# Patient Record
Sex: Male | Born: 1950
Health system: Southern US, Community
[De-identification: ages and names within clinical notes are randomized; demographics above are authoritative.]

## PROBLEM LIST (undated history)

## (undated) DIAGNOSIS — T7840XA Allergy, unspecified, initial encounter: Secondary | ICD-10-CM

## (undated) DIAGNOSIS — Z973 Presence of spectacles and contact lenses: Secondary | ICD-10-CM

## (undated) DIAGNOSIS — Z87442 Personal history of urinary calculi: Secondary | ICD-10-CM

## (undated) DIAGNOSIS — L989 Disorder of the skin and subcutaneous tissue, unspecified: Secondary | ICD-10-CM

## (undated) DIAGNOSIS — C801 Malignant (primary) neoplasm, unspecified: Secondary | ICD-10-CM

## (undated) DIAGNOSIS — I10 Essential (primary) hypertension: Secondary | ICD-10-CM

## (undated) DIAGNOSIS — T8859XA Other complications of anesthesia, initial encounter: Secondary | ICD-10-CM

## (undated) DIAGNOSIS — E785 Hyperlipidemia, unspecified: Secondary | ICD-10-CM

## (undated) DIAGNOSIS — R42 Dizziness and giddiness: Secondary | ICD-10-CM

## (undated) DIAGNOSIS — H269 Unspecified cataract: Secondary | ICD-10-CM

## (undated) DIAGNOSIS — E538 Deficiency of other specified B group vitamins: Secondary | ICD-10-CM

## (undated) HISTORY — PX: COLONOSCOPY: SHX174

## (undated) HISTORY — DX: Personal history of urinary calculi: Z87.442

## (undated) HISTORY — DX: Deficiency of other specified B group vitamins: E53.8

## (undated) HISTORY — DX: Allergy, unspecified, initial encounter: T78.40XA

## (undated) HISTORY — DX: Dizziness and giddiness: R42

## (undated) HISTORY — DX: Unspecified cataract: H26.9

## (undated) HISTORY — PX: OTHER SURGICAL HISTORY: SHX169

## (undated) HISTORY — DX: Essential (primary) hypertension: I10

## (undated) HISTORY — PX: POLYPECTOMY: SHX149

## (undated) HISTORY — DX: Hyperlipidemia, unspecified: E78.5

---

## 1972-02-08 HISTORY — PX: OTHER SURGICAL HISTORY: SHX169

## 1998-05-22 ENCOUNTER — Emergency Department (HOSPITAL_COMMUNITY): Admission: EM | Admit: 1998-05-22 | Discharge: 1998-05-22 | Payer: Self-pay | Admitting: Emergency Medicine

## 1999-09-07 ENCOUNTER — Ambulatory Visit (HOSPITAL_COMMUNITY): Admission: RE | Admit: 1999-09-07 | Discharge: 1999-09-07 | Payer: Self-pay | Admitting: Family Medicine

## 1999-09-07 ENCOUNTER — Encounter: Payer: Self-pay | Admitting: Family Medicine

## 1999-10-04 ENCOUNTER — Ambulatory Visit (HOSPITAL_COMMUNITY): Admission: RE | Admit: 1999-10-04 | Discharge: 1999-10-04 | Payer: Self-pay | Admitting: Internal Medicine

## 1999-10-04 ENCOUNTER — Encounter: Payer: Self-pay | Admitting: Internal Medicine

## 2003-11-21 ENCOUNTER — Ambulatory Visit (HOSPITAL_COMMUNITY): Admission: RE | Admit: 2003-11-21 | Discharge: 2003-11-21 | Payer: Self-pay | Admitting: Internal Medicine

## 2003-12-30 ENCOUNTER — Ambulatory Visit: Payer: Self-pay | Admitting: Internal Medicine

## 2004-04-12 ENCOUNTER — Ambulatory Visit: Payer: Self-pay | Admitting: Internal Medicine

## 2004-05-10 ENCOUNTER — Ambulatory Visit: Payer: Self-pay | Admitting: Internal Medicine

## 2004-12-13 ENCOUNTER — Ambulatory Visit: Payer: Self-pay | Admitting: Internal Medicine

## 2004-12-15 ENCOUNTER — Ambulatory Visit: Payer: Self-pay | Admitting: Internal Medicine

## 2005-01-04 ENCOUNTER — Ambulatory Visit: Payer: Self-pay | Admitting: Gastroenterology

## 2005-03-10 ENCOUNTER — Ambulatory Visit: Payer: Self-pay | Admitting: Gastroenterology

## 2005-03-10 ENCOUNTER — Ambulatory Visit: Payer: Self-pay | Admitting: Internal Medicine

## 2005-04-04 ENCOUNTER — Ambulatory Visit: Payer: Self-pay | Admitting: Internal Medicine

## 2005-04-04 ENCOUNTER — Encounter (INDEPENDENT_AMBULATORY_CARE_PROVIDER_SITE_OTHER): Payer: Self-pay | Admitting: Specialist

## 2005-08-17 ENCOUNTER — Ambulatory Visit: Payer: Self-pay | Admitting: Internal Medicine

## 2005-12-01 ENCOUNTER — Ambulatory Visit: Payer: Self-pay | Admitting: Internal Medicine

## 2005-12-07 ENCOUNTER — Ambulatory Visit (HOSPITAL_COMMUNITY): Admission: RE | Admit: 2005-12-07 | Discharge: 2005-12-07 | Payer: Self-pay | Admitting: Internal Medicine

## 2005-12-19 ENCOUNTER — Ambulatory Visit: Payer: Self-pay | Admitting: Internal Medicine

## 2005-12-21 ENCOUNTER — Ambulatory Visit: Payer: Self-pay

## 2006-07-18 ENCOUNTER — Ambulatory Visit: Payer: Self-pay | Admitting: Internal Medicine

## 2006-07-18 LAB — CONVERTED CEMR LAB: Hgb A1c MFr Bld: 8.7 % — ABNORMAL HIGH (ref 4.6–6.0)

## 2007-01-02 ENCOUNTER — Telehealth: Payer: Self-pay | Admitting: Internal Medicine

## 2007-01-02 ENCOUNTER — Ambulatory Visit: Payer: Self-pay | Admitting: Internal Medicine

## 2007-01-02 LAB — CONVERTED CEMR LAB: Hgb A1c MFr Bld: 6.6 % — ABNORMAL HIGH (ref 4.6–6.0)

## 2007-01-18 ENCOUNTER — Telehealth: Payer: Self-pay | Admitting: Internal Medicine

## 2007-01-22 ENCOUNTER — Encounter: Payer: Self-pay | Admitting: *Deleted

## 2007-01-22 DIAGNOSIS — Z87442 Personal history of urinary calculi: Secondary | ICD-10-CM

## 2007-01-22 DIAGNOSIS — E785 Hyperlipidemia, unspecified: Secondary | ICD-10-CM | POA: Insufficient documentation

## 2007-01-22 DIAGNOSIS — R42 Dizziness and giddiness: Secondary | ICD-10-CM

## 2007-01-22 DIAGNOSIS — E1159 Type 2 diabetes mellitus with other circulatory complications: Secondary | ICD-10-CM | POA: Insufficient documentation

## 2007-02-09 ENCOUNTER — Telehealth: Payer: Self-pay | Admitting: Internal Medicine

## 2007-07-24 ENCOUNTER — Encounter: Payer: Self-pay | Admitting: Internal Medicine

## 2007-08-01 ENCOUNTER — Telehealth: Payer: Self-pay | Admitting: Internal Medicine

## 2007-08-24 ENCOUNTER — Ambulatory Visit: Payer: Self-pay | Admitting: Internal Medicine

## 2007-08-24 DIAGNOSIS — R05 Cough: Secondary | ICD-10-CM

## 2007-08-24 DIAGNOSIS — R051 Acute cough: Secondary | ICD-10-CM | POA: Insufficient documentation

## 2007-08-24 DIAGNOSIS — R6882 Decreased libido: Secondary | ICD-10-CM | POA: Insufficient documentation

## 2007-08-24 LAB — CONVERTED CEMR LAB
BUN: 15 mg/dL (ref 6–23)
CO2: 29 meq/L (ref 19–32)
Chloride: 104 meq/L (ref 96–112)
Cholesterol: 153 mg/dL (ref 0–200)
Glucose, Bld: 86 mg/dL (ref 70–99)
Potassium: 4.3 meq/L (ref 3.5–5.1)
Sodium: 139 meq/L (ref 135–145)
Testosterone: 502.88 ng/dL (ref 350.00–890)
VLDL: 17 mg/dL (ref 0–40)

## 2007-08-26 ENCOUNTER — Encounter: Payer: Self-pay | Admitting: Internal Medicine

## 2007-08-30 ENCOUNTER — Telehealth: Payer: Self-pay | Admitting: Internal Medicine

## 2007-11-05 ENCOUNTER — Telehealth: Payer: Self-pay | Admitting: Internal Medicine

## 2007-11-26 ENCOUNTER — Ambulatory Visit: Payer: Self-pay | Admitting: Internal Medicine

## 2007-11-26 DIAGNOSIS — J1189 Influenza due to unidentified influenza virus with other manifestations: Secondary | ICD-10-CM | POA: Insufficient documentation

## 2007-11-26 LAB — CONVERTED CEMR LAB
Inflenza A Ag: NEGATIVE
Influenza B Ag: NEGATIVE

## 2007-11-28 ENCOUNTER — Telehealth: Payer: Self-pay | Admitting: Internal Medicine

## 2007-11-30 ENCOUNTER — Telehealth: Payer: Self-pay | Admitting: Internal Medicine

## 2007-12-02 ENCOUNTER — Encounter: Payer: Self-pay | Admitting: Internal Medicine

## 2008-01-30 ENCOUNTER — Telehealth: Payer: Self-pay | Admitting: Internal Medicine

## 2008-03-17 ENCOUNTER — Ambulatory Visit: Payer: Self-pay | Admitting: Internal Medicine

## 2008-03-20 ENCOUNTER — Encounter (INDEPENDENT_AMBULATORY_CARE_PROVIDER_SITE_OTHER): Payer: Self-pay | Admitting: *Deleted

## 2008-03-20 ENCOUNTER — Telehealth (INDEPENDENT_AMBULATORY_CARE_PROVIDER_SITE_OTHER): Payer: Self-pay | Admitting: *Deleted

## 2008-05-16 ENCOUNTER — Ambulatory Visit: Payer: Self-pay | Admitting: Internal Medicine

## 2008-05-30 ENCOUNTER — Encounter: Payer: Self-pay | Admitting: Internal Medicine

## 2008-05-30 ENCOUNTER — Ambulatory Visit: Payer: Self-pay | Admitting: Internal Medicine

## 2008-05-30 LAB — HM COLONOSCOPY

## 2008-06-02 ENCOUNTER — Encounter: Payer: Self-pay | Admitting: Internal Medicine

## 2008-06-17 ENCOUNTER — Telehealth: Payer: Self-pay | Admitting: Internal Medicine

## 2008-11-13 ENCOUNTER — Telehealth (INDEPENDENT_AMBULATORY_CARE_PROVIDER_SITE_OTHER): Payer: Self-pay | Admitting: *Deleted

## 2008-11-27 ENCOUNTER — Telehealth: Payer: Self-pay | Admitting: Internal Medicine

## 2008-12-31 ENCOUNTER — Ambulatory Visit: Payer: Self-pay | Admitting: Internal Medicine

## 2009-01-05 LAB — CONVERTED CEMR LAB: Hgb A1c MFr Bld: 8.2 % — ABNORMAL HIGH (ref 4.6–6.5)

## 2009-01-06 ENCOUNTER — Telehealth (INDEPENDENT_AMBULATORY_CARE_PROVIDER_SITE_OTHER): Payer: Self-pay | Admitting: *Deleted

## 2009-01-07 ENCOUNTER — Ambulatory Visit: Payer: Self-pay | Admitting: Internal Medicine

## 2009-01-09 ENCOUNTER — Telehealth: Payer: Self-pay | Admitting: Internal Medicine

## 2009-01-11 ENCOUNTER — Telehealth: Payer: Self-pay | Admitting: Family Medicine

## 2009-01-12 ENCOUNTER — Ambulatory Visit: Payer: Self-pay | Admitting: Internal Medicine

## 2009-01-27 ENCOUNTER — Ambulatory Visit: Payer: Self-pay | Admitting: Internal Medicine

## 2009-01-27 DIAGNOSIS — J069 Acute upper respiratory infection, unspecified: Secondary | ICD-10-CM | POA: Insufficient documentation

## 2009-02-16 ENCOUNTER — Telehealth: Payer: Self-pay | Admitting: Internal Medicine

## 2009-03-02 ENCOUNTER — Ambulatory Visit: Payer: Self-pay | Admitting: Internal Medicine

## 2009-03-02 DIAGNOSIS — B351 Tinea unguium: Secondary | ICD-10-CM

## 2009-03-03 ENCOUNTER — Telehealth: Payer: Self-pay | Admitting: Internal Medicine

## 2009-03-03 ENCOUNTER — Encounter: Payer: Self-pay | Admitting: Internal Medicine

## 2009-03-05 ENCOUNTER — Encounter: Payer: Self-pay | Admitting: Internal Medicine

## 2009-03-13 ENCOUNTER — Telehealth: Payer: Self-pay | Admitting: Internal Medicine

## 2009-03-16 ENCOUNTER — Ambulatory Visit: Payer: Self-pay | Admitting: Internal Medicine

## 2009-04-27 ENCOUNTER — Telehealth: Payer: Self-pay | Admitting: Internal Medicine

## 2009-04-28 ENCOUNTER — Ambulatory Visit: Payer: Self-pay | Admitting: Internal Medicine

## 2009-04-28 DIAGNOSIS — J309 Allergic rhinitis, unspecified: Secondary | ICD-10-CM | POA: Insufficient documentation

## 2009-06-19 ENCOUNTER — Ambulatory Visit: Payer: Self-pay | Admitting: Internal Medicine

## 2009-06-19 LAB — CONVERTED CEMR LAB: Hgb A1c MFr Bld: 7.5 % — ABNORMAL HIGH (ref 4.6–6.5)

## 2009-06-22 ENCOUNTER — Encounter: Payer: Self-pay | Admitting: Internal Medicine

## 2009-08-15 ENCOUNTER — Ambulatory Visit: Payer: Self-pay | Admitting: Family Medicine

## 2009-08-15 DIAGNOSIS — L255 Unspecified contact dermatitis due to plants, except food: Secondary | ICD-10-CM

## 2009-11-27 ENCOUNTER — Ambulatory Visit: Payer: Self-pay | Admitting: Internal Medicine

## 2009-11-27 LAB — CONVERTED CEMR LAB: Hgb A1c MFr Bld: 8.9 % — ABNORMAL HIGH (ref 4.6–6.5)

## 2009-12-04 ENCOUNTER — Telehealth: Payer: Self-pay | Admitting: Internal Medicine

## 2009-12-07 ENCOUNTER — Ambulatory Visit: Payer: Self-pay | Admitting: Internal Medicine

## 2010-01-06 ENCOUNTER — Ambulatory Visit: Payer: Self-pay | Admitting: Internal Medicine

## 2010-02-07 DIAGNOSIS — Z87442 Personal history of urinary calculi: Secondary | ICD-10-CM

## 2010-02-07 HISTORY — DX: Personal history of urinary calculi: Z87.442

## 2010-02-19 ENCOUNTER — Other Ambulatory Visit: Payer: Self-pay | Admitting: Internal Medicine

## 2010-02-19 ENCOUNTER — Ambulatory Visit
Admission: RE | Admit: 2010-02-19 | Discharge: 2010-02-19 | Payer: Self-pay | Source: Home / Self Care | Attending: Internal Medicine | Admitting: Internal Medicine

## 2010-02-19 DIAGNOSIS — L299 Pruritus, unspecified: Secondary | ICD-10-CM | POA: Insufficient documentation

## 2010-02-19 LAB — BASIC METABOLIC PANEL
BUN: 15 mg/dL (ref 6–23)
CO2: 30 mEq/L (ref 19–32)
Calcium: 9.4 mg/dL (ref 8.4–10.5)
Chloride: 104 mEq/L (ref 96–112)
Creatinine, Ser: 1 mg/dL (ref 0.4–1.5)
GFR: 82.73 mL/min (ref >60.00)
Glucose, Bld: 124 mg/dL — ABNORMAL HIGH (ref 70–99)
Potassium: 4.8 mEq/L (ref 3.5–5.1)
Sodium: 141 mEq/L (ref 135–145)

## 2010-02-19 LAB — HEPATIC FUNCTION PANEL
ALT: 20 U/L (ref 0–53)
AST: 19 U/L (ref 0–37)
Albumin: 4.4 g/dL (ref 3.5–5.2)
Alkaline Phosphatase: 47 U/L (ref 39–117)
Bilirubin, Direct: 0.2 mg/dL (ref 0.0–0.3)
Total Bilirubin: 0.9 mg/dL (ref 0.3–1.2)
Total Protein: 7.2 g/dL (ref 6.0–8.3)

## 2010-02-19 LAB — HIGH SENSITIVITY CRP: CRP, High Sensitivity: 2.43 mg/L (ref 0.00–5.00)

## 2010-02-19 LAB — HEMOGLOBIN A1C: Hgb A1c MFr Bld: 7.8 % — ABNORMAL HIGH (ref 4.6–6.5)

## 2010-02-22 LAB — TSH: TSH: 0.94 u[IU]/mL (ref 0.35–5.50)

## 2010-02-22 LAB — B12 AND FOLATE PANEL
Folate: 17.6 ng/mL (ref >5.9)
Vitamin B-12: 165 pg/mL — ABNORMAL LOW (ref 211–911)

## 2010-02-23 ENCOUNTER — Telehealth: Payer: Self-pay | Admitting: Internal Medicine

## 2010-02-27 ENCOUNTER — Encounter: Payer: Self-pay | Admitting: Internal Medicine

## 2010-03-01 ENCOUNTER — Ambulatory Visit
Admission: RE | Admit: 2010-03-01 | Discharge: 2010-03-01 | Payer: Self-pay | Source: Home / Self Care | Attending: Internal Medicine | Admitting: Internal Medicine

## 2010-03-01 DIAGNOSIS — E538 Deficiency of other specified B group vitamins: Secondary | ICD-10-CM | POA: Insufficient documentation

## 2010-03-02 ENCOUNTER — Telehealth: Payer: Self-pay | Admitting: Internal Medicine

## 2010-03-09 NOTE — Progress Notes (Signed)
  Phone Note Refill Request Message from:  Fax from Pharmacy on February 16, 2009 1:33 PM  Refills Requested: Medication #1:  METFORMIN HCL 1000 MG  TABS two times a day Initial call taken by: Ami Bullins CMA,  February 16, 2009 1:33 PM    Prescriptions: METFORMIN HCL 1000 MG  TABS (METFORMIN HCL) two times a day  #60 x 6   Entered by:   Ami Bullins CMA   Authorized by:   Jacques Navy MD   Signed by:   Bill Salinas CMA on 02/16/2009   Method used:   Electronically to        [facility]* (retail)       [address]       [place], [place]  [address]       Ph: [phone]       Fax: [phone]   RxID:   [id number]

## 2010-03-09 NOTE — Assessment & Plan Note (Signed)
Summary: TOE FUNGUS--D/T---STC   Vital Signs:  Patient profile:   60 year old male Height:      68 inches Weight:      232 pounds BMI:     35.40 O2 Sat:      97 % on Room air Temp:     97.4 degrees F oral Pulse rate:   66 / minute BP sitting:   122 / 80  (left arm) Cuff size:   large  Vitals Entered By: Bill Salinas CMA (March 02, 2009 11:31 AM)  O2 Flow:  Room air CC: pt c/o toe fungus on his left foot, under his toenail on his great toe/ ab   Primary Care Provider:  Kimorah Ridolfi  CC:  pt c/o toe fungus on his left foot and under his toenail on his great toe/ ab.  History of Present Illness: toe nail fungus left great nail. It is causing discomfort when he wears his work  boots.   Diuabetes - reports that blood sugars have been running high-greater than 200 in the AM. He is taking [place] and metformin. He is requesting a referral to a nutritionist to help better manage his diabetes   Current Medications (verified): 1)  Zocor 20 Mg  Tabs (Simvastatin) .... Once Daily 2)  Aspirin 325 Mg  Tabs (Aspirin) .... Once Daily 3)  Metformin Hcl 1000 Mg  Tabs (Metformin Hcl) .... Two Times A Day 4)  Lisinopril 10 Mg  Tabs (Lisinopril) .... Once Daily 5)  Tylenol Extra Strength 500 Mg Tabs (Acetaminophen) .... As Directed 6)  Coricidin Hbp Cough/cold 4-30 Mg Tabs (Chlorpheniramine-Dm) .... As Directed 7)  Promethazine-Codeine 6.25-10 Mg/86ml Syrp (Promethazine-Codeine) .... As Needed 8)  Onetouch Ultra Test  Strp (Glucose Blood) .... Use 1 Strip Three Times A Day 9)  Januvia 100 Mg Tabs (Sitagliptin Phosphate) .Marland Kitchen.. 1 Daily  Allergies (verified): 1)  ! * Actos PMH-FH-SH reviewed-no changes except otherwise noted  Review of Systems  The patient denies anorexia, fever, weight loss, weight gain, chest pain, dyspnea on exertion, prolonged cough, abdominal pain, muscle weakness, difficulty walking, and enlarged lymph nodes.    Physical Exam  Skin:  left great nail with a white fungus  appearance and thickened nail.    Impression & Recommendations:  Problem # 1:  ONYCHOMYCOSIS, TOENAILS (ICD-110.1)  Nail fungus that is causing pain.  Plan terbinafine 250mg  once daily x 90 days.  His updated medication list for this problem includes:    Terbinafine Hcl 250 Mg Tabs (Terbinafine hcl) .Marland Kitchen... 1 by mouth once daily  Problem # 2:  DIABETES MELLITUS, TYPE II (ICD-250.00)  His updated medication list for this problem includes:    Aspirin 325 Mg Tabs (Aspirin) ..... Once daily    Metformin Hcl 1000 Mg Tabs (Metformin hcl) .Marland Kitchen..Marland Kitchen Two times a day    Lisinopril 10 Mg Tabs (Lisinopril) ..... Once daily    Januvia 100 Mg Tabs (Sitagliptin phosphate) .Marland Kitchen... 1 daily  Labs Reviewed: Creat: 1.0 (08/24/2007)    Reviewed HgBA1c results: 8.2 (12/31/2008)  6.9 (08/24/2007)  Suboptimal control.  Plan  continue present meds - taking Januvia at night          refer to Ms. Spagnola at [facility]  Orders: Nutrition Referral (Nutrition)  Complete Medication List: 1)  Zocor 20 Mg Tabs (Simvastatin) .... Once daily 2)  Aspirin 325 Mg Tabs (Aspirin) .... Once daily 3)  Metformin Hcl 1000 Mg Tabs (Metformin hcl) .... Two times a day 4)  Lisinopril 10  Mg Tabs (Lisinopril) .... Once daily 5)  Tylenol Extra Strength 500 Mg Tabs (Acetaminophen) .... As directed 6)  Coricidin Hbp Cough/cold 4-30 Mg Tabs (Chlorpheniramine-dm) .... As directed 7)  Promethazine-codeine 6.25-10 Mg/57ml Syrp (Promethazine-codeine) .... As needed 8)  Onetouch Ultra Test Strp (Glucose blood) .... Use 1 strip three times a day 9)  Januvia 100 Mg Tabs (Sitagliptin phosphate) .Marland Kitchen.. 1 daily 10)  Terbinafine Hcl 250 Mg Tabs (Terbinafine hcl) .Marland Kitchen.. 1 by mouth once daily Prescriptions: ASPIRIN 325 MG  TABS (ASPIRIN) once daily  #300 x 1   Entered and Authorized by:   Jacques Navy MD   Signed by:   Jacques Navy MD on 03/02/2009   Method used:   Print then Give to Patient   RxID:    [id number] TERBINAFINE HCL 250 MG TABS (TERBINAFINE HCL) 1 by mouth once daily  #30 x 2   Entered and Authorized by:   Jacques Navy MD   Signed by:   Jacques Navy MD on 03/02/2009   Method used:   Electronically to        [facility]* (retail)       [name]       [place], [place]  [address]       Ph: [phone]       Fax: [phone]   RxID:   [id number]

## 2010-03-09 NOTE — Assessment & Plan Note (Signed)
Summary: SICK ALL WEEKEND,COUGH,SORE THROAT/CD   Vital Signs:  Patient profile:   60 year old male Height:      68 inches Weight:      229 pounds BMI:     34.95 O2 Sat:      97 % on Room air Temp:     98.3 degrees F oral Pulse rate:   75 / minute BP sitting:   102 / 60  (left arm) Cuff size:   large  Vitals Entered By: Bill Salinas CMA (March 16, 2009 12:03 PM)  O2 Flow:  Room air CC: pt c/o runny nose, head congestion , cough and fever x 4 days/ pt has not had a flu shot this year/ ab   Primary Care Provider:  Demecia Northway  CC:  pt c/o runny nose, head congestion , and cough and fever x 4 days/ pt has not had a flu shot this year/ ab.  History of Present Illness: Patient presents with a 3 day h/o fever to 102, cough productive copious sputum, head congestion. No vomiting, some nausea. He has been able to keep down fluids. He has had rhinorrhea. He hasn't had significant SOB.   Current Medications (verified): 1)  Zocor 20 Mg  Tabs (Simvastatin) .... Once Daily 2)  Aspirin 325 Mg  Tabs (Aspirin) .... Once Daily 3)  Metformin Hcl 1000 Mg  Tabs (Metformin Hcl) .... Two Times A Day 4)  Lisinopril 10 Mg  Tabs (Lisinopril) .... Once Daily 5)  Tylenol Extra Strength 500 Mg Tabs (Acetaminophen) .... As Directed 6)  Coricidin Hbp Cough/cold 4-30 Mg Tabs (Chlorpheniramine-Dm) .... As Directed 7)  Promethazine-Codeine 6.25-10 Mg/67ml Syrp (Promethazine-Codeine) .... As Needed 8)  Onetouch Ultra Test  Strp (Glucose Blood) .... Use 1 Strip Three Times A Day 9)  Januvia 100 Mg Tabs (Sitagliptin Phosphate) .Marland Kitchen.. 1 Daily 10)  Terbinafine Hcl 250 Mg Tabs (Terbinafine Hcl) .Marland Kitchen.. 1 By Mouth Once Daily  Allergies (verified): 1)  ! * Actos  Past History:  Past Medical History: Last updated: 01/22/2007 HYPERLIPIDEMIA (ICD-272.4) VERTIGO (ICD-780.4) DIABETES MELLITUS, TYPE II (ICD-250.00) NEPHROLITHIASIS, HX OF (ICD-V13.01) CONTACT DERMATITIS (ICD-692.9)    Past Surgical History: Last  updated: 01/22/2007 * TRAUMATIC INJURY AND REPAIR OF RIGHT HAND.  Family History: Last updated: September 05, 2007 father-deceased @ early 48's: lung cancer, DM mother - 28: CAD/PCI with stents; cardiomyopathy with AICD Neg- colon or prostate cancer  Social History: Last updated: September 05, 2007 HSG married '79 1 son;'79, 1 daughter '81; 1 granddaughter, one on the way work: [employer].  Review of Systems       The patient complains of fever and prolonged cough.  The patient denies anorexia, weight loss, weight gain, decreased hearing, chest pain, dyspnea on exertion, hemoptysis, hematochezia, muscle weakness, difficulty walking, depression, enlarged lymph nodes, and angioedema.    Physical Exam  General:  alert, well-developed, well-nourished, and normal appearance.   Head:  normocephalic and no abnormalities observed.  Tender to percussion over the frontal and maxillary sinus. Eyes:  vision grossly intact.   Ears:  R ear normal and L ear normal.   Nose:  no external deformity.   Mouth:  good dentition.   Neck:  full ROM and no thyromegaly.   Chest Wall:  no tenderness.   Lungs:  normal respiratory effort, normal breath sounds, no crackles, and no wheezes.   Heart:  normal rate, regular rhythm, no JVD, and no HJR.   Abdomen:  soft and non-tender.   Msk:  normal ROM.  Neurologic:  alert & oriented X3 and cranial nerves II-XII intact.     Impression & Recommendations:  Problem # 1:  BRONCHITIS-ACUTE (ICD-466.0) acute bronchitis and URI  Plan - Augmentin 875 two times a day           Prom/cod 1 tsp q 6          APAP  His updated medication list for this problem includes:    Coricidin Hbp Cough/cold 4-30 Mg Tabs (Chlorpheniramine-dm) .Marland Kitchen... As directed    Promethazine-codeine 6.25-10 Mg/20ml Syrp (Promethazine-codeine) .Marland Kitchen... As needed    Amoxicillin-pot Clavulanate 875-125 Mg Tabs (Amoxicillin-pot clavulanate) .Marland Kitchen... 1 by mouth two times a day x 7 for uri     Promethazine-codeine 6.25-10 Mg/63ml Syrp (Promethazine-codeine) .Marland Kitchen... 1 tsp q 6 as needed  Complete Medication List: 1)  Zocor 20 Mg Tabs (Simvastatin) .... Once daily 2)  Aspirin 325 Mg Tabs (Aspirin) .... Once daily 3)  Metformin Hcl 1000 Mg Tabs (Metformin hcl) .... Two times a day 4)  Lisinopril 10 Mg Tabs (Lisinopril) .... Once daily 5)  Tylenol Extra Strength 500 Mg Tabs (Acetaminophen) .... As directed 6)  Coricidin Hbp Cough/cold 4-30 Mg Tabs (Chlorpheniramine-dm) .... As directed 7)  Promethazine-codeine 6.25-10 Mg/41ml Syrp (Promethazine-codeine) .... As needed 8)  Onetouch Ultra Test Strp (Glucose blood) .... Use 1 strip three times a day 9)  Januvia 100 Mg Tabs (Sitagliptin phosphate) .Marland Kitchen.. 1 daily 10)  Terbinafine Hcl 250 Mg Tabs (Terbinafine hcl) .Marland Kitchen.. 1 by mouth once daily 11)  Amoxicillin-pot Clavulanate 875-125 Mg Tabs (Amoxicillin-pot clavulanate) .Marland Kitchen.. 1 by mouth two times a day x 7 for uri 12)  Promethazine-codeine 6.25-10 Mg/91ml Syrp (Promethazine-codeine) .Marland Kitchen.. 1 tsp q 6 as needed  Other Orders: Admin 1st Vaccine ([address]) Flu Vaccine 56yrs + ([address]) Prescriptions: PROMETHAZINE-CODEINE 6.25-10 MG/5ML SYRP (PROMETHAZINE-CODEINE) 1 tsp q 6 as needed  #8 oz x 1   Entered and Authorized by:   Jacques Navy MD   Signed by:   Jacques Navy MD on 03/16/2009   Method used:   Handwritten   RxID:   [id number] AMOXICILLIN-POT CLAVULANATE 875-125 MG TABS (AMOXICILLIN-POT CLAVULANATE) 1 by mouth two times a day x 7 for URI  #12 x 0   Entered and Authorized by:   Jacques Navy MD   Signed by:   Jacques Navy MD on 03/16/2009   Method used:   Electronically to        [facility]* (retail)       [address]       [place], [place]  [address]       Ph: [phone]       Fax: [phone]   RxID:   [id number]   Flu Vaccine Consent Questions     Do you have a history of severe allergic reactions to this vaccine? no    Any prior history of allergic reactions  to egg and/or gelatin? no    Do you have a sensitivity to the preservative Thimersol? no    Do you have a past history of Guillan-Barre Syndrome? no    Do you currently have an acute febrile illness? no    Have you ever had a severe reaction to latex? no    Vaccine information given and explained to patient? yes    Are you currently pregnant? no    Lot Number:[id number]   Exp Date:08/06/2009   Site Given  Left Deltoid IMlbflu

## 2010-03-09 NOTE — Progress Notes (Signed)
  Phone Note Refill Request Message from:  Fax from Pharmacy on April 27, 2009 4:32 PM  Refills Requested: Medication #1:  LISINOPRIL 10 MG  TABS once daily Initial call taken by: Rock Nephew CMA,  April 27, 2009 4:32 PM    Prescriptions: LISINOPRIL 10 MG  TABS (LISINOPRIL) once daily  #30 x 4   Entered by:   Rock Nephew CMA   Authorized by:   Jacques Navy MD   Signed by:   Rock Nephew CMA on 04/27/2009   Method used:   Electronically to        [facility]* (retail)       [address]       [place], [place]  [address]       Ph: [phone]       Fax: [phone]   RxID:   [id number]

## 2010-03-09 NOTE — Progress Notes (Signed)
Summary: Januvia  Phone Note Call from Patient   Summary of Call: Patient is requesting rx for januvia 100mg  to go to [facility]. OK?  Initial call taken by: Lamar Sprinkles, CMA,  February 16, 2009 11:02 AM  Follow-up for Phone Call        Pt called regarding elevated cbgs. Cbg's have been 116 and 140 during the day after lunch and other random times during the day. This am at 5:30 cbg was 393. No symptoms. Please advise.  Follow-up by: Lamar Sprinkles, CMA,  February 16, 2009 1:54 PM  Additional Follow-up for Phone Call Additional follow up Details #1::        The 393 before breakfast is too high. Please provide additional readings. 116-140 during the day is OK. She be checking before meals.   OK to refill [place] Additional Follow-up by: Jacques Navy MD,  February 16, 2009 2:35 PM    Additional Follow-up for Phone Call Additional follow up Details #2::    Sorry, forgot to add, previously asked patient to check cbgs in am. ALso, pt is not currently sick and no new med changes. Januvia refilled, will call pt for update of cbgs later in the week.................................Marland KitchenLamar Sprinkles, CMA  February 16, 2009 4:26 PM   Spoke with pt regarding cbgs. Last 4 fasting cbgs 220, 248, 150 & 130. After speaking with his pharmacist pt decided to take his januvia in the evenings. ..............................Marland KitchenLamar Sprinkles, CMA  February 23, 2009 9:12 AM   New/Updated Medications: JANUVIA 100 MG TABS (SITAGLIPTIN PHOSPHATE) 1 daily Prescriptions: JANUVIA 100 MG TABS (SITAGLIPTIN PHOSPHATE) 1 daily  #90 x 1   Entered by:   Lamar Sprinkles, CMA   Authorized by:   Jacques Navy MD   Signed by:   Lamar Sprinkles, CMA on 02/16/2009   Method used:   Electronically to        [facility]* (retail)       [address]       [place], [place]  [address]       Ph: [phone]       Fax: [phone]   RxID:   [id number]

## 2010-03-09 NOTE — Medication Information (Signed)
Summary: P Auth Terbinafine/[facility]  P Auth Terbinafine/[facility]   Imported By: Lester [place] 03/10/2009 09:29:14  _____________________________________________________________________  External Attachment:    Type:   Image     Comment:   External Document

## 2010-03-09 NOTE — Progress Notes (Signed)
  Phone Note Refill Request Message from:  Fax from Pharmacy on March 13, 2009 10:15 AM  Refills Requested: Medication #1:  ZOCOR 20 MG  TABS once daily Initial call taken by: Ami Bullins CMA,  March 13, 2009 10:15 AM    Prescriptions: ZOCOR 20 MG  TABS (SIMVASTATIN) once daily  #30 x 6   Entered by:   Ami Bullins CMA   Authorized by:   Jacques Navy MD   Signed by:   Bill Salinas CMA on 03/13/2009   Method used:   Electronically to        [facility] (retail)       [address]       [place], [place]  [address]       Ph: [phone]       Fax: [phone]   RxID:   [id number]

## 2010-03-09 NOTE — Letter (Signed)
   [facility] [address] [place], [place]  [address] Phone: [phone]      Jun 22, 2009   Aaron Zimmerman [address] [place], [place] [address]  RE:  LAB RESULTS  Dear  [name],  The following is an interpretation of your most recent lab tests.  Please take note of any instructions provided or changes to medications that have resulted from your lab work.   DIABETIC STUDIES:  Improved - continue management Blood Glucose: 86   HgbA1C: 7.5      A1C is above goal but below the threshold for chainging medication. You need to be sure you are doing all you can do in regard to diet (no sugar and low carb) and exercise.   Will need repeat A1C in 3 months.    Sincerely Yours,    Jacques Navy MD

## 2010-03-09 NOTE — Miscellaneous (Signed)
Summary: [facility]   Imported By: Lester [place] 05/07/2009 12:00:29  _____________________________________________________________________  External Attachment:    Type:   Image     Comment:   External Document

## 2010-03-09 NOTE — Medication Information (Signed)
Summary: Terbinafine Approved/[facility]  Terbinafine Approved/[facility]   Imported By: Sherian Rein 03/13/2009 07:04:38  _____________________________________________________________________  External Attachment:    Type:   Image     Comment:   External Document

## 2010-03-09 NOTE — Progress Notes (Signed)
Summary: Terbinafine PA  Phone Note From Pharmacy   Caller: [facility] [phone] Call For: ID:  [id number]  Case:  [id number]  Summary of Call: PA request--Terbinafine. They will fax form. Initial call taken by: Lucious Groves,  March 03, 2009 12:57 PM  Follow-up for Phone Call        form rec'd, completed, and awaiting signature. Follow-up by: Lucious Groves,  March 03, 2009 3:07 PM  Additional Follow-up for Phone Call Additional follow up Details #1::        form faxed to [facility]. Additional Follow-up by: Lucious Groves,  March 05, 2009 11:54 AM    Additional Follow-up for Phone Call Additional follow up Details #2::    Approved until 09-01-2009. Follow-up by: Lucious Groves,  March 06, 2009 11:19 AM

## 2010-03-09 NOTE — Assessment & Plan Note (Signed)
Summary: [place]...AS.   Vital Signs:  Patient profile:   60 year old male Height:      70 inches Weight:      225 pounds BMI:     32.40 O2 Sat:      97 % on Room air Pulse rate:   76 / minute BP sitting:   112 / 70  (left arm) Cuff size:   large  Vitals Entered By: Payton Spark CMA (August 15, 2009 9:42 AM)  O2 Flow:  Room air CC: [facility] both arms x 2 days., Rash   History of Present Illness:  Rash      This is a 60 year old man who presents with Rash.  The symptoms began 1 week ago.  Pt was working in yard helping a friend last Friday and noticed in 2-3 days ago.  Pt used some otc med with no relief.  The patient complains of blisters, but denies macules, papules, nodules, hives, welts, pustules, ulcers, itching, scaling, weeping, oozing, redness, increased warmth, and tenderness.  The rash is located on the right arm and left arm.  The rash is worse with heat and better with cold.  The patient denies the following symptoms: fever, headache, facial swelling, tongue swelling, burning, difficulty breathing, abdominal pain, nausea, vomiting, diarrhea, dizziness, sore throat, dysuria, eye symptoms, arthralgias, and vaginal discharge.    Current Medications (verified): 1)  Zocor 20 Mg  Tabs (Simvastatin) .... Once Daily 2)  Aspirin 325 Mg  Tabs (Aspirin) .... Once Daily 3)  Metformin Hcl 1000 Mg  Tabs (Metformin Hcl) .... Two Times A Day 4)  Lisinopril 10 Mg  Tabs (Lisinopril) .... Once Daily 5)  Tylenol Extra Strength 500 Mg Tabs (Acetaminophen) .... As Directed 6)  Coricidin Hbp Cough/cold 4-30 Mg Tabs (Chlorpheniramine-Dm) .... As Directed 7)  Promethazine-Codeine 6.25-10 Mg/61ml Syrp (Promethazine-Codeine) .... As Needed 8)  Onetouch Ultra Test  Strp (Glucose Blood) .... Use 1 Strip Three Times A Day 9)  Januvia 100 Mg Tabs (Sitagliptin Phosphate) .Marland Kitchen.. 1 Daily 10)  Amoxicillin-Pot Clavulanate 875-125 Mg Tabs (Amoxicillin-Pot Clavulanate) .Marland Kitchen.. 1 By Mouth Two Times A Day X 7  For Uri 11)  Promethazine-Codeine 6.25-10 Mg/63ml Syrp (Promethazine-Codeine) .Marland Kitchen.. 1 Tsp Q 6 As Needed 12)  Loratadine 10 Mg Tabs (Loratadine) .Marland Kitchen.. 1 By Mouth Once Daily 13)  Fluticasone Propionate 50 Mcg/act Susp (Fluticasone Propionate) .Marland Kitchen.. 1 Spray/nares Once A Day 14)  Prednisone 10 Mg Tabs (Prednisone) .... 3 By Mouth Once Daily For 3 Days Then 2 By Mouth Once Daily For 3 Days Then 1 By Mouth Once Daily For 3 Days 15)  Benadryl 25 Mg Caps (Diphenhydramine Hcl)  Allergies (verified): 1)  ! * Actos  Past History:  Past medical, surgical, family and social histories (including risk factors) reviewed for relevance to current acute and chronic problems.  Past Medical History: Reviewed history from 01/22/2007 and no changes required. HYPERLIPIDEMIA (ICD-272.4) VERTIGO (ICD-780.4) DIABETES MELLITUS, TYPE II (ICD-250.00) NEPHROLITHIASIS, HX OF (ICD-V13.01) CONTACT DERMATITIS (ICD-692.9)    Past Surgical History: Reviewed history from 01/22/2007 and no changes required. * TRAUMATIC INJURY AND REPAIR OF RIGHT HAND.  Family History: Reviewed history from 08/24/2007 and no changes required. father-deceased @ early 64's: lung cancer, DM mother - 61: CAD/PCI with stents; cardiomyopathy with AICD Neg- colon or prostate cancer  Social History: Reviewed history from 08/24/2007 and no changes required. HSG married '79 1 son;'79, 1 daughter '81; 1 granddaughter, one on the way work: [employer].  Review of Systems      See HPI  Physical Exam  General:  Well-developed,well-nourished,in no acute distress; alert,appropriate and cooperative throughout examination Skin:  + vesicular rash both arm --wrist to elbows Psych:  Oriented X3 and normally interactive.     Impression & Recommendations:  Problem # 1:  CONTACT DERMATITIS&OTHER ECZEMA DUE TO PLANTS (ICD-692.6)  His updated medication list for this problem includes:    Loratadine 10 Mg Tabs (Loratadine) .Marland Kitchen... 1 by  mouth once daily    Prednisone 10 Mg Tabs (Prednisone) .Marland KitchenMarland KitchenMarland KitchenMarland Kitchen 3 by mouth once daily for 3 days then 2 by mouth once daily for 3 days then 1 by mouth once daily for 3 days    Benadryl 25 Mg Caps (Diphenhydramine hcl)  Discussed avoidance of triggers and symptomatic treatment.   Orders: Admin of Therapeutic Inj  intramuscular or subcutaneous ([address]) Depo- Medrol 80mg  (J1040)  Complete Medication List: 1)  Zocor 20 Mg Tabs (Simvastatin) .... Once daily 2)  Aspirin 325 Mg Tabs (Aspirin) .... Once daily 3)  Metformin Hcl 1000 Mg Tabs (Metformin hcl) .... Two times a day 4)  Lisinopril 10 Mg Tabs (Lisinopril) .... Once daily 5)  Tylenol Extra Strength 500 Mg Tabs (Acetaminophen) .... As directed 6)  Coricidin Hbp Cough/cold 4-30 Mg Tabs (Chlorpheniramine-dm) .... As directed 7)  Promethazine-codeine 6.25-10 Mg/54ml Syrp (Promethazine-codeine) .... As needed 8)  Onetouch Ultra Test Strp (Glucose blood) .... Use 1 strip three times a day 9)  Januvia 100 Mg Tabs (Sitagliptin phosphate) .Marland Kitchen.. 1 daily 10)  Amoxicillin-pot Clavulanate 875-125 Mg Tabs (Amoxicillin-pot clavulanate) .Marland Kitchen.. 1 by mouth two times a day x 7 for uri 11)  Promethazine-codeine 6.25-10 Mg/74ml Syrp (Promethazine-codeine) .Marland Kitchen.. 1 tsp q 6 as needed 12)  Loratadine 10 Mg Tabs (Loratadine) .Marland Kitchen.. 1 by mouth once daily 13)  Fluticasone Propionate 50 Mcg/act Susp (Fluticasone propionate) .Marland Kitchen.. 1 spray/nares once a day 14)  Prednisone 10 Mg Tabs (Prednisone) .... 3 by mouth once daily for 3 days then 2 by mouth once daily for 3 days then 1 by mouth once daily for 3 days 15)  Benadryl 25 Mg Caps (Diphenhydramine hcl) Prescriptions: PREDNISONE 10 MG TABS (PREDNISONE) 3 by mouth once daily for 3 days then 2 by mouth once daily for 3 days then 1 by mouth once daily for 3 days  #9 x 0   Entered and Authorized by:   Loreen Freud DO   Signed by:   Loreen Freud DO on 08/15/2009   Method used:   Electronically to        [facility]* (retail)        [address]       [place], [place]  [address]       Ph: [phone]       Fax: [phone]   RxID:   [id number]    Medication Administration  Injection # 1:    Medication: Depo- Medrol 80mg     Diagnosis: CONTACT DERMATITIS&OTHER ECZEMA DUE TO PLANTS (ICD-692.6)    Route: IM    Site: RUOQ gluteus    Exp Date: 05/2012    Lot #: [id number]    Patient tolerated injection without complications    Given by: Payton Spark CMA (August 15, 2009 10:22 AM)  Orders Added: 1)  Est. Patient Level III [07371] 2)  Admin of Therapeutic Inj  intramuscular or subcutaneous [96372] 3)  Depo- Medrol 80mg  [J1040]

## 2010-03-09 NOTE — Assessment & Plan Note (Signed)
Summary: questions about his insulin he has been on 3-4 wks/#/cd   Vital Signs:  Patient profile:   60 year old male Height:      70 inches Weight:      220 pounds BMI:     31.68 O2 Sat:      98 % on Room air Temp:     97.3 degrees F oral Pulse rate:   65 / minute BP sitting:   122 / 72  (left arm) Cuff size:   large  Vitals Entered By: Bill Salinas CMA (January 06, 2010 10:58 AM)  O2 Flow:  Room air CC: office visit to discuss levemir   Primary Care Alysiana Ethridge:  Norins  CC:  office visit to discuss levemir.  History of Present Illness: Patient returns to reviewe CBG record since starting levemir. He has not had any hypoglycemic episodes. He feels comfortable with the levemir pen device.   Current Medications (verified): 1)  Zocor 20 Mg  Tabs (Simvastatin) .... Once Daily 2)  Aspirin 325 Mg  Tabs (Aspirin) .... Once Daily 3)  Metformin Hcl 1000 Mg  Tabs (Metformin Hcl) .... Two Times A Day 4)  Lisinopril 10 Mg  Tabs (Lisinopril) .... Once Daily 5)  Tylenol Extra Strength 500 Mg Tabs (Acetaminophen) .... As Directed 6)  Coricidin Hbp Cough/cold 4-30 Mg Tabs (Chlorpheniramine-Dm) .... As Directed 7)  Promethazine-Codeine 6.25-10 Mg/101ml Syrp (Promethazine-Codeine) .... As Needed 8)  Onetouch Ultra Test  Strp (Glucose Blood) .... Use 1 Strip Three Times A Day 9)  Levemir Flexpen 100 Unit/ml Soln (Insulin Detemir) .... Start At 20 Units and Do A 3 Day Titration Schedule. 10)  Loratadine 10 Mg Tabs (Loratadine) .Marland Kitchen.. 1 By Mouth Once Daily 11)  Fluticasone Propionate 50 Mcg/act Susp (Fluticasone Propionate) .Marland Kitchen.. 1 Spray/nares Once A Day 12)  Benadryl 25 Mg Caps (Diphenhydramine Hcl) 13)  Novofine 32g X 6 Mm Misc (Insulin Pen Needle) .... Use As Directed  Allergies (verified): 1)  ! * Actos PMH-FH-SH reviewed-no changes except otherwise noted  Review of Systems  The patient denies anorexia, fever, weight loss, syncope, headaches, abdominal pain, muscle weakness, difficulty  walking, and enlarged lymph nodes.    Physical Exam  General:  Well-developed,well-nourished,in no acute distress; alert,appropriate and cooperative throughout examination Head:  normocephalic and atraumatic.   Eyes:  C&S clear Lungs:  normal respiratory effort.   Heart:  normal rate and regular rhythm.   Neurologic:  alert & oriented X3, cranial nerves II-XII intact, and gait normal.   Skin:  turgor normal and color normal.   Psych:  Oriented X3, memory intact for recent and remote, and normally interactive.     Impression & Recommendations:  Problem # 1:  DIABETES MELLITUS, TYPE II (ICD-250.00) CBGs are better but not at goal.  Plan - continue 3 day titration increasing levemir as needed. He can expect a maintenance dose of 30-50 units at bedtime.           he is advised to eat regular meals and to have a bedtime snack           he will submit CBG readings in 1-2 weeks without need for OV>   His updated medication list for this problem includes:    Aspirin 325 Mg Tabs (Aspirin) ..... Once daily    Metformin Hcl 1000 Mg Tabs (Metformin hcl) .Marland Kitchen..Marland Kitchen Two times a day    Lisinopril 10 Mg Tabs (Lisinopril) ..... Once daily    Levemir Flexpen 100 Unit/ml Soln (Insulin  detemir) ..... Start at 20 units and do a 3 day titration schedule.  Complete Medication List: 1)  Zocor 20 Mg Tabs (Simvastatin) .... Once daily 2)  Aspirin 325 Mg Tabs (Aspirin) .... Once daily 3)  Metformin Hcl 1000 Mg Tabs (Metformin hcl) .... Two times a day 4)  Lisinopril 10 Mg Tabs (Lisinopril) .... Once daily 5)  Tylenol Extra Strength 500 Mg Tabs (Acetaminophen) .... As directed 6)  Coricidin Hbp Cough/cold 4-30 Mg Tabs (Chlorpheniramine-dm) .... As directed 7)  Promethazine-codeine 6.25-10 Mg/82ml Syrp (Promethazine-codeine) .... As needed 8)  Onetouch Ultra Test Strp (Glucose blood) .... Use 1 strip three times a day 9)  Levemir Flexpen 100 Unit/ml Soln (Insulin detemir) .... Start at 20 units and do a 3 day  titration schedule. 10)  Loratadine 10 Mg Tabs (Loratadine) .Marland Kitchen.. 1 by mouth once daily 11)  Fluticasone Propionate 50 Mcg/act Susp (Fluticasone propionate) .Marland Kitchen.. 1 spray/nares once a day 12)  Benadryl 25 Mg Caps (Diphenhydramine hcl) 13)  Novofine 32g X 6 Mm Misc (Insulin pen needle) .... Use as directed   Orders Added: 1)  Est. Patient Level II [[address]]

## 2010-03-09 NOTE — Progress Notes (Signed)
Summary: results  Phone Note Call from Patient Call back at Home Phone [phone]   Caller: Spouse Summary of Call: Patient would like to know results od A1C. Please advise  Initial call taken by: Rock Nephew CMA,  December 04, 2009 1:36 PM  Follow-up for Phone Call        A1C 8.9%. Patient needs ov!! Follow-up by: Jacques Navy MD,  December 05, 2009 11:51 AM  Additional Follow-up for Phone Call Additional follow up Details #1::        Pt seen today Additional Follow-up by: Lamar Sprinkles, CMA,  December 07, 2009 12:25 PM

## 2010-03-09 NOTE — Assessment & Plan Note (Signed)
Summary: FU ON A1C /PER FLAG /NWS   Vital Signs:  Patient profile:   60 year old male Height:      70 inches Weight:      223 pounds BMI:     32.11 O2 Sat:      97 % on Room air Temp:     98.7 degrees F oral Pulse rate:   73 / minute Pulse rhythm:   regular BP sitting:   116 / 70  (left arm) Cuff size:   large  Vitals Entered By: Rock Nephew CMA (December 07, 2009 11:06 AM)  O2 Flow:  Room air CC: follow-up visit//dicuss lab results Is Patient Diabetic? Yes Did you bring your meter with you today? No Pain Assessment Patient in pain? no       Does patient need assistance? Functional Status Self care Ambulation Normal   Primary Care Provider:  Norins  CC:  follow-up visit//dicuss lab results.  History of Present Illness: Patient returns to discuss elevated A1C at 8.9%. He has been taking metformin 1000mg  two times a day and januvia 100mg  once daily. He has tried and failed on actos and glimeperide. He has been adherent to a diabetic diet. During our discussion he did indicate that he was expecting this with his AM CBGs often being in the 200-300 range.  Current Medications (verified): 1)  Zocor 20 Mg  Tabs (Simvastatin) .... Once Daily 2)  Aspirin 325 Mg  Tabs (Aspirin) .... Once Daily 3)  Metformin Hcl 1000 Mg  Tabs (Metformin Hcl) .... Two Times A Day 4)  Lisinopril 10 Mg  Tabs (Lisinopril) .... Once Daily 5)  Tylenol Extra Strength 500 Mg Tabs (Acetaminophen) .... As Directed 6)  Coricidin Hbp Cough/cold 4-30 Mg Tabs (Chlorpheniramine-Dm) .... As Directed 7)  Promethazine-Codeine 6.25-10 Mg/8ml Syrp (Promethazine-Codeine) .... As Needed 8)  Onetouch Ultra Test  Strp (Glucose Blood) .... Use 1 Strip Three Times A Day 9)  Januvia 100 Mg Tabs (Sitagliptin Phosphate) .Marland Kitchen.. 1 Daily 10)  Amoxicillin-Pot Clavulanate 875-125 Mg Tabs (Amoxicillin-Pot Clavulanate) .Marland Kitchen.. 1 By Mouth Two Times A Day X 7 For Uri 11)  Promethazine-Codeine 6.25-10 Mg/32ml Syrp  (Promethazine-Codeine) .Marland Kitchen.. 1 Tsp Q 6 As Needed 12)  Loratadine 10 Mg Tabs (Loratadine) .Marland Kitchen.. 1 By Mouth Once Daily 13)  Fluticasone Propionate 50 Mcg/act Susp (Fluticasone Propionate) .Marland Kitchen.. 1 Spray/nares Once A Day 14)  Benadryl 25 Mg Caps (Diphenhydramine Hcl)  Allergies (verified): 1)  ! * Actos  Past History:  Past Medical History: Last updated: 01/22/2007 HYPERLIPIDEMIA (ICD-272.4) VERTIGO (ICD-780.4) DIABETES MELLITUS, TYPE II (ICD-250.00) NEPHROLITHIASIS, HX OF (ICD-V13.01) CONTACT DERMATITIS (ICD-692.9)    Past Surgical History: Last updated: 01/22/2007 * TRAUMATIC INJURY AND REPAIR OF RIGHT HAND. FH reviewed for relevance, SH/Risk Factors reviewed for relevance  Review of Systems  The patient denies anorexia, weight loss, weight gain, hoarseness, chest pain, dyspnea on exertion, peripheral edema, headaches, melena, severe indigestion/heartburn, muscle weakness, difficulty walking, and depression.    Physical Exam  General:  Well-developed,well-nourished,in no acute distress; alert,appropriate and cooperative throughout examination Lungs:  normal respiratory effort.   Heart:  normal rate and regular rhythm.   Neurologic:  alert & oriented X3, gait normal, and DTRs symmetrical and normal.   Skin:  turgor normal and color normal.   Psych:  normally interactive, good eye contact, and not anxious appearing.     Impression & Recommendations:  Problem # 1:  DIABETES MELLITUS, TYPE II (ICD-250.00) Patient is poorly controlled on his present regimen  Plan -  will initiate basal insulin therapy with levemir: starting dose will be 20 units at bedtime with a 3 day titration schedule.            Patient and his wife were educated in the use of the levemir kwikpen and the titration schedule           Patient to report back CBGs fasting and then preprandial  His updated medication list for this problem includes:    Aspirin 325 Mg Tabs (Aspirin) ..... Once daily    Metformin  Hcl 1000 Mg Tabs (Metformin hcl) .Marland Kitchen..Marland Kitchen Two times a day    Lisinopril 10 Mg Tabs (Lisinopril) ..... Once daily    Levemir Flexpen 100 Unit/ml Soln (Insulin detemir) ..... Start at 20 units and do a 3 day titration schedule.  Complete Medication List: 1)  Zocor 20 Mg Tabs (Simvastatin) .... Once daily 2)  Aspirin 325 Mg Tabs (Aspirin) .... Once daily 3)  Metformin Hcl 1000 Mg Tabs (Metformin hcl) .... Two times a day 4)  Lisinopril 10 Mg Tabs (Lisinopril) .... Once daily 5)  Tylenol Extra Strength 500 Mg Tabs (Acetaminophen) .... As directed 6)  Coricidin Hbp Cough/cold 4-30 Mg Tabs (Chlorpheniramine-dm) .... As directed 7)  Promethazine-codeine 6.25-10 Mg/71ml Syrp (Promethazine-codeine) .... As needed 8)  Onetouch Ultra Test Strp (Glucose blood) .... Use 1 strip three times a day 9)  Levemir Flexpen 100 Unit/ml Soln (Insulin detemir) .... Start at 20 units and do a 3 day titration schedule. 10)  Loratadine 10 Mg Tabs (Loratadine) .Marland Kitchen.. 1 by mouth once daily 11)  Fluticasone Propionate 50 Mcg/act Susp (Fluticasone propionate) .Marland Kitchen.. 1 spray/nares once a day 12)  Benadryl 25 Mg Caps (Diphenhydramine hcl) 13)  Novofine 32g X 6 Mm Misc (Insulin pen needle) .... Use as directed  Patient Instructions: 1)  Diabetes - time to start basal insulin therapy with levemir. Start at 20 units at bedtime. check BS every morning: if sugar is greater than 150 3 days in a row increase the levemir by 3 units. Continue this 3 day cycle until the morning blood sugar is consistently between 80 and 130. Continue the metform 1000mg  two times a day. Stop the Januvia. After you have a stable dose of levemir then check blood sugars twice a day: before breakfast and then rotate before lunch, before supper and before bed. Call for questions or problems.  Prescriptions: ONETOUCH ULTRA TEST  STRP (GLUCOSE BLOOD) use 1 strip three times a day  #90 x 6   Entered and Authorized by:   Jacques Navy MD   Signed by:   Jacques Navy MD on 12/07/2009   Method used:   Electronically to        [facility]* (retail)       [address]       [place], [place]  [address]       Ph: [phone]       Fax: [phone]   RxID:   [id number] NOVOFINE 32G X 6 MM MISC (INSULIN PEN NEEDLE) use as directed  #30 x 12   Entered and Authorized by:   Jacques Navy MD   Signed by:   Jacques Navy MD on 12/07/2009   Method used:   Electronically to        [facility]* (retail)       [address]       [place], [place]  [address]       Ph: [phone]  Fax: [phone]   RxID:   [id number] LEVEMIR FLEXPEN 100 UNIT/ML SOLN (INSULIN DETEMIR) start at 20 units and do a 3 day titration schedule.  #1 kwik pen x 12   Entered and Authorized by:   Jacques Navy MD   Signed by:   Jacques Navy MD on 12/07/2009   Method used:   Electronically to        [facility]* (retail)       [address]       [place], [place]  [address]       Ph: [phone]       Fax: [phone]   RxID:   [id number]    Orders Added: 1)  Est. Patient Level III [[address]]

## 2010-03-09 NOTE — Assessment & Plan Note (Signed)
Summary: STILL HAS A COUGH/NWS   Vital Signs:  Patient profile:   60 year old male Height:      70 inches (177.80 cm) Weight:      225.75 pounds (102.61 kg) O2 Sat:      95 % on Room air Temp:     98.1 degrees F (36.72 degrees C) oral Pulse rate:   81 / minute BP sitting:   112 / 64  (left arm) Cuff size:   large  Vitals Entered By: Bill Salinas CMA (April 28, 2009 1:57 PM) Taken by Sydell Axon SMA  O2 Flow:  Room air CC: Pt c/o cough X 1 month, clear mucus, post nasal drip.//[place]   Primary Care Provider:  Lorik Guo  CC:  Pt c/o cough X 1 month, clear mucus, and post nasal drip.//[place].  History of Present Illness: Seen about a month ago for URI. Now with recurrent cough, post-nasal drainage, itchy watery eyes. No sore throat, no shortness of breath.  Current Medications (verified): 1)  Zocor 20 Mg  Tabs (Simvastatin) .... Once Daily 2)  Aspirin 325 Mg  Tabs (Aspirin) .... Once Daily 3)  Metformin Hcl 1000 Mg  Tabs (Metformin Hcl) .... Two Times A Day 4)  Lisinopril 10 Mg  Tabs (Lisinopril) .... Once Daily 5)  Tylenol Extra Strength 500 Mg Tabs (Acetaminophen) .... As Directed 6)  Coricidin Hbp Cough/cold 4-30 Mg Tabs (Chlorpheniramine-Dm) .... As Directed 7)  Promethazine-Codeine 6.25-10 Mg/20ml Syrp (Promethazine-Codeine) .... As Needed 8)  Onetouch Ultra Test  Strp (Glucose Blood) .... Use 1 Strip Three Times A Day 9)  Januvia 100 Mg Tabs (Sitagliptin Phosphate) .Marland Kitchen.. 1 Daily 10)  Terbinafine Hcl 250 Mg Tabs (Terbinafine Hcl) .Marland Kitchen.. 1 By Mouth Once Daily 11)  Amoxicillin-Pot Clavulanate 875-125 Mg Tabs (Amoxicillin-Pot Clavulanate) .Marland Kitchen.. 1 By Mouth Two Times A Day X 7 For Uri 12)  Promethazine-Codeine 6.25-10 Mg/4ml Syrp (Promethazine-Codeine) .Marland Kitchen.. 1 Tsp Q 6 As Needed  Allergies (verified): 1)  ! * Actos  Past History:  Past Medical History: Last updated: 01/22/2007 HYPERLIPIDEMIA (ICD-272.4) VERTIGO (ICD-780.4) DIABETES MELLITUS, TYPE II (ICD-250.00) NEPHROLITHIASIS,  HX OF (ICD-V13.01) CONTACT DERMATITIS (ICD-692.9)    Past Surgical History: Last updated: 01/22/2007 * TRAUMATIC INJURY AND REPAIR OF RIGHT HAND.  Family History: Last updated: 08/26/07 father-deceased @ early 59's: lung cancer, DM mother - 22: CAD/PCI with stents; cardiomyopathy with AICD Neg- colon or prostate cancer  Social History: Last updated: 26-Aug-2007 HSG married '79 1 son;'79, 1 daughter '81; 1 granddaughter, one on the way work: [employer].  Risk Factors: Smoking Status: never (01/22/2007)  Review of Systems  The patient denies anorexia, fever, weight loss, weight gain, hoarseness, chest pain, dyspnea on exertion, prolonged cough, hemoptysis, abdominal pain, hematuria, muscle weakness, difficulty walking, depression, and enlarged lymph nodes.    Physical Exam  General:  Well-developed,well-nourished,in no acute distress; alert,appropriate and cooperative throughout examination Head:  Normocephalic and atraumatic without obvious abnormalities. No apparent alopecia or balding. Lungs:  normal respiratory effort, normal breath sounds, no crackles, and no wheezes.   Heart:  normal rate and regular rhythm.   Abdomen:  soft and normal bowel sounds.     Impression & Recommendations:  Problem # 1:  ALLERGIC RHINITIS, SEASONAL, MILD (ICD-477.9)  no evidence of infection. Appears to  be allergic  Plan - loratadine        Fluticasone.  His updated medication list for this problem includes:    Loratadine 10 Mg Tabs (Loratadine) .Marland Kitchen... 1 by mouth once  daily    Fluticasone Propionate 50 Mcg/act Susp (Fluticasone propionate) .Marland Kitchen... 1 spray/nares once a day  Complete Medication List: 1)  Zocor 20 Mg Tabs (Simvastatin) .... Once daily 2)  Aspirin 325 Mg Tabs (Aspirin) .... Once daily 3)  Metformin Hcl 1000 Mg Tabs (Metformin hcl) .... Two times a day 4)  Lisinopril 10 Mg Tabs (Lisinopril) .... Once daily 5)  Tylenol Extra Strength 500 Mg Tabs  (Acetaminophen) .... As directed 6)  Coricidin Hbp Cough/cold 4-30 Mg Tabs (Chlorpheniramine-dm) .... As directed 7)  Promethazine-codeine 6.25-10 Mg/43ml Syrp (Promethazine-codeine) .... As needed 8)  Onetouch Ultra Test Strp (Glucose blood) .... Use 1 strip three times a day 9)  Januvia 100 Mg Tabs (Sitagliptin phosphate) .Marland Kitchen.. 1 daily 10)  Terbinafine Hcl 250 Mg Tabs (Terbinafine hcl) .Marland Kitchen.. 1 by mouth once daily 11)  Amoxicillin-pot Clavulanate 875-125 Mg Tabs (Amoxicillin-pot clavulanate) .Marland Kitchen.. 1 by mouth two times a day x 7 for uri 12)  Promethazine-codeine 6.25-10 Mg/73ml Syrp (Promethazine-codeine) .Marland Kitchen.. 1 tsp q 6 as needed 13)  Loratadine 10 Mg Tabs (Loratadine) .Marland Kitchen.. 1 by mouth once daily 14)  Fluticasone Propionate 50 Mcg/act Susp (Fluticasone propionate) .Marland Kitchen.. 1 spray/nares once a day  Patient Instructions: 1)  Allergic rhinnitis - seasonal allergies. Plan - Loratadine  10mg  once a day. Fluticasone nasal spray - 1 spray per nostril daily. Be sure to swish and gargle after use.  Prescriptions: FLUTICASONE PROPIONATE 50 MCG/ACT SUSP (FLUTICASONE PROPIONATE) 1 spray/nares once a day  #1 x 4   Entered by:   Ami Bullins CMA   Authorized by:   Jacques Navy MD   Signed by:   Bill Salinas CMA on 04/28/2009   Method used:   Electronically to        [facility]* (retail)       [address]       [place], [place]  [address]       Ph: [phone]       Fax: [phone]   RxID:   [id number]

## 2010-03-11 NOTE — Assessment & Plan Note (Signed)
Summary: itching, ?due to diabetes/#?cd   Vital Signs:  Patient profile:   60 year old male Weight:      218 pounds Temp:     98.2 degrees F Pulse rate:   66 / minute BP sitting:   132 / 84  (left arm) Cuff size:   regular  Vitals Entered By: Lamar Sprinkles, CMA (February 19, 2010 4:03 PM) CC: Itching all over off and on, more on feet and top of head. Stopped levemir x 1 wk but no change in symptoms/SD   Primary Care Provider:  Lorely Bubb  CC:  Itching all over off and on and more on feet and top of head. Stopped levemir x 1 wk but no change in symptoms/SD.  History of Present Illness: Patient presents with a complaint of pruritis that has a wide distribution, has been present for 2-3 weeks, is associated with pressure on the skin, i.e. his feet will itch when he takes off his shoes and socks. The itching will then move to other locations, particularly his posterior cervical region. He has had no rash. He has no new contact allergens. No new medications. He had suspended the use of levemir but saw no improvement in his pruritis.  He has no other systemic symptoms.   Allergies: 1)  ! * Actos  Past History:  Past Medical History: Last updated: 01/22/2007 HYPERLIPIDEMIA (ICD-272.4) VERTIGO (ICD-780.4) DIABETES MELLITUS, TYPE II (ICD-250.00) NEPHROLITHIASIS, HX OF (ICD-V13.01) CONTACT DERMATITIS (ICD-692.9)    Past Surgical History: Last updated: 01/22/2007 * TRAUMATIC INJURY AND REPAIR OF RIGHT HAND. FH reviewed for relevance, SH/Risk Factors reviewed for relevance  Review of Systems  The patient denies anorexia, fever, weight loss, weight gain, hoarseness, dyspnea on exertion, prolonged cough, abdominal pain, severe indigestion/heartburn, suspicious skin lesions, depression, enlarged lymph nodes, and angioedema.    Physical Exam  General:  Well-developed,well-nourished,in no acute distress; alert,appropriate and cooperative throughout examination Head:  normocephalic and  atraumatic.   Eyes:  C&S clear Neck:  supple, no thyromegaly, and no thyroid nodules or tenderness.   Lungs:  normal respiratory effort and normal breath sounds.   Heart:  normal rate and regular rhythm.   Msk:  no joint tenderness and no redness over joints.   Pulses:  2+ radial Neurologic:  alert & oriented X3, cranial nerves II-XII intact, and gait normal.   Skin:  turgor normal, color normal, no rashes, no suspicious lesions, and no ecchymoses.   Cervical Nodes:  no anterior cervical adenopathy and no posterior cervical adenopathy.   Psych:  Oriented X3, memory intact for recent and remote, normally interactive, and good eye contact.     Impression & Recommendations:  Problem # 1:  UNSPECIFIED PRURITIC DISORDER (ICD-698.9) Patient with pruritis and no skin lesions or history of new contact allergens.  Plan - r/o metabolic causes of pruritis          for symptomatic relief: loratadine 10mg  once daily; ranitidine 150mg  two times a day.  Orders: TLB-B12 + Folate Pnl (82746_82607-B12/FOL) TLB-BMP (Basic Metabolic Panel-BMET) (80048-METABOL) TLB-Hepatic/Liver Function Pnl (80076-HEPATIC) TLB-TSH (Thyroid Stimulating Hormone) (84443-TSH) TLB-CRP-High Sensitivity (C-Reactive Protein) (86140-FCRP)  Addedndum- labs are normal except for B12 deficiency.  Plan - B12 replacement.  Complete Medication List: 1)  Zocor 20 Mg Tabs (Simvastatin) .... Once daily 2)  Aspirin 325 Mg Tabs (Aspirin) .... Once daily 3)  Metformin Hcl 1000 Mg Tabs (Metformin hcl) .... Two times a day 4)  Lisinopril 10 Mg Tabs (Lisinopril) .... Once daily 5)  Tylenol Extra  Strength 500 Mg Tabs (Acetaminophen) .... As directed 6)  Coricidin Hbp Cough/cold 4-30 Mg Tabs (Chlorpheniramine-dm) .... As directed 7)  Promethazine-codeine 6.25-10 Mg/73ml Syrp (Promethazine-codeine) .... As needed 8)  Onetouch Ultra Test Strp (Glucose blood) .... Use 1 strip three times a day 9)  Levemir Flexpen 100 Unit/ml Soln (Insulin  detemir) .... Start at 20 units and do a 3 day titration schedule. 10)  Loratadine 10 Mg Tabs (Loratadine) .Marland Kitchen.. 1 by mouth once daily 11)  Fluticasone Propionate 50 Mcg/act Susp (Fluticasone propionate) .Marland Kitchen.. 1 spray/nares once a day 12)  Benadryl 25 Mg Caps (Diphenhydramine hcl) 13)  Novofine 32g X 6 Mm Misc (Insulin pen needle) .... Use as directed  Other Orders: TLB-A1C / Hgb A1C (Glycohemoglobin) (83036-A1C)   Patient: Aaron Zimmerman Note: All result statuses are Final unless otherwise noted.  Tests: (1) BMP (METABOL)   Sodium                    141 mEq/L                   135-145   Potassium                 4.8 mEq/L                   3.5-5.1   Chloride                  104 mEq/L                   96-112   Carbon Dioxide            30 mEq/L                    19-32   Glucose              [H]  124 mg/dL                   14-78   BUN                       15 mg/dL                    2-95   Creatinine                1.0 mg/dL                   6.2-1.3   Calcium                   9.4 mg/dL                   0.8-65.7   GFR                       82.73 mL/min                >60.00  Tests: (2) Hepatic/Liver Function Panel (HEPATIC)   Total Bilirubin           0.9 mg/dL                   8.4-6.9   Direct Bilirubin          0.2 mg/dL                   6.2-9.5   Alkaline Phosphatase  47 U/L                      39-117   AST                       19 U/L                      0-37   ALT                       20 U/L                      0-53   Total Protein             7.2 g/dL                    3.4-7.4   Albumin                   4.4 g/dL                    2.5-9.5  Tests: (3) TSH (TSH)   FastTSH                   0.94 uIU/mL                 0.35-5.50  Tests: (4) Full Range CRP (FCRP)   CRPH                      2.43 mg/L                   0.00-5.00     Note:  An elevated hs-CRP (>5 mg/L) should be repeated after 2 weeks to rule out recent infection or trauma.  Tests: (5) Hemoglobin  A1C (A1C)   Hemoglobin A1C       [H]  7.8 %                       4.6-6.5     Glycemic Control Guidelines for People with Diabetes:     Non Diabetic:  <6%     Goal of Therapy: <7%     Additional Action Suggested:  >8% Tests: (1) B12 + Folate Panel (B12/FOL)   Vitamin B12          [L]  165 pg/mL                   211-911   Folate                    17.6 ng/mL                  >5.9  Patient Instructions: 1)  Pruritis - will check for a metabolic cause of itching - liver function, thyroid, B12, inflammation. For itching try taking loratadine ( a non-sedating antihistamine) 10mg  once a day and ranitidine 75mg  twice a day.   Orders Added: 1)  TLB-B12 + Folate Pnl [82746_82607-B12/FOL] 2)  TLB-BMP (Basic Metabolic Panel-BMET) [80048-METABOL] 3)  TLB-Hepatic/Liver Function Pnl [80076-HEPATIC] 4)  TLB-TSH (Thyroid Stimulating Hormone) [84443-TSH] 5)  TLB-CRP-High Sensitivity (C-Reactive Protein) [86140-FCRP] 6)  TLB-A1C / Hgb A1C (Glycohemoglobin) [83036-A1C] 7)  Est. Patient Level III [63875]

## 2010-03-11 NOTE — Assessment & Plan Note (Signed)
Summary: b12 shot/men/cd  Nurse Visit   Allergies: 1)  ! * Actos  Medication Administration  Injection # 1:    Medication: Vit B12 1000 mcg    Diagnosis: VITAMIN B12 DEFICIENCY (ICD-266.2)    Route: IM    Site: R deltoid    Exp Date: 10/2011    Lot #: [id number]    Mfr: [employer]    Patient tolerated injection without complications    Given by: Rock Nephew CMA (March 01, 2010 4:55 PM)     Medication Administration  Injection # 1:    Medication: Vit B12 1000 mcg    Diagnosis: VITAMIN B12 DEFICIENCY (ICD-266.2)    Route: IM    Site: R deltoid    Exp Date: 10/2011    Lot #: [id number]    Mfr: [employer]    Patient tolerated injection without complications    Given by: Rock Nephew CMA (March 01, 2010 4:55 PM)

## 2010-03-11 NOTE — Progress Notes (Signed)
       New/Updated Medications: CYANOCOBALAMIN 1000 MCG/ML SOLN (CYANOCOBALAMIN) 2 doses of B12 for two week, then once a month injection x 6 months Prescriptions: CYANOCOBALAMIN 1000 MCG/ML SOLN (CYANOCOBALAMIN) 2 doses of B12 for two week, then once a month injection x 6 months  #3 month supp x 1   Entered by:   Ami Bullins CMA   Authorized by:   Jacques Navy MD   Signed by:   Bill Salinas CMA on 03/02/2010   Method used:   Faxed to ...       [facility]* (retail)       [address]       [place], [place]  [address]       Ph: [phone]       Fax: [phone]   RxID:   [id number]   Appended Document:     Clinical Lists Changes  Medications: Changed medication from CYANOCOBALAMIN 1000 MCG/ML SOLN (CYANOCOBALAMIN) 2 doses of B12 for two week, then once a month injection x 6 months to CYANOCOBALAMIN 1000 MCG/ML SOLN (CYANOCOBALAMIN) 1dose of B12 in two week, then once a month injection x 6 months

## 2010-03-11 NOTE — Progress Notes (Signed)
  Phone Note Outgoing Call   Reason for Call: Discuss lab or test results Summary of Call: Please call patient: labs revealed A1C 7.8% - need to continue the levemir. B12 was low at 195 and a possible cause of itch. Plan - B12 replacment: q 2 weeks x two doses then once a month for 6 months. The remainder of lab is normal. If the loratadine and ranitidine are not helping I need to know.  THANKs Initial call taken by: Jacques Navy MD,  February 23, 2010 7:58 AM  Follow-up for Phone Call        informed pts wife. Pt wife states that the loratidine and ranitidine is working well. Pt will call to set up B12 injection when pt checks his sch Follow-up by: Ami Bullins CMA,  February 25, 2010 3:39 PM

## 2010-03-12 ENCOUNTER — Telehealth: Payer: Self-pay | Admitting: Internal Medicine

## 2010-03-16 ENCOUNTER — Telehealth: Payer: Self-pay | Admitting: Internal Medicine

## 2010-03-17 ENCOUNTER — Encounter (INDEPENDENT_AMBULATORY_CARE_PROVIDER_SITE_OTHER): Payer: Self-pay | Admitting: *Deleted

## 2010-03-17 NOTE — Progress Notes (Signed)
  Phone Note Refill Request Message from:  Fax from Pharmacy on March 12, 2010 8:29 AM  Refills Requested: Medication #1:  LORATADINE 10 MG TABS 1 by mouth once daily Initial call taken by: Ami Bullins CMA,  March 12, 2010 8:29 AM    Prescriptions: LORATADINE 10 MG TABS (LORATADINE) 1 by mouth once daily  #30 x 4   Entered by:   Ami Bullins CMA   Authorized by:   Jacques Navy MD   Signed by:   Bill Salinas CMA on 03/12/2010   Method used:   Electronically to        [facility]* (retail)       [address]       [place], [place]  [address]       Ph: [phone]       Fax: [phone]   RxID:   [id number]

## 2010-03-18 ENCOUNTER — Telehealth: Payer: Self-pay | Admitting: Internal Medicine

## 2010-03-18 ENCOUNTER — Encounter: Payer: Self-pay | Admitting: Internal Medicine

## 2010-03-18 ENCOUNTER — Encounter (INDEPENDENT_AMBULATORY_CARE_PROVIDER_SITE_OTHER): Payer: BC Managed Care – PPO

## 2010-03-18 DIAGNOSIS — R42 Dizziness and giddiness: Secondary | ICD-10-CM

## 2010-03-22 ENCOUNTER — Encounter (INDEPENDENT_AMBULATORY_CARE_PROVIDER_SITE_OTHER): Payer: Self-pay | Admitting: *Deleted

## 2010-03-22 ENCOUNTER — Ambulatory Visit (INDEPENDENT_AMBULATORY_CARE_PROVIDER_SITE_OTHER): Payer: BC Managed Care – PPO

## 2010-03-22 DIAGNOSIS — E538 Deficiency of other specified B group vitamins: Secondary | ICD-10-CM

## 2010-03-25 NOTE — Miscellaneous (Signed)
Summary: Orders Update  Clinical Lists Changes  Orders: Added new Test order of Carotid Duplex (Carotid Duplex) - Signed 

## 2010-03-29 ENCOUNTER — Telehealth: Payer: Self-pay | Admitting: Internal Medicine

## 2010-03-29 ENCOUNTER — Ambulatory Visit: Payer: Self-pay

## 2010-03-30 ENCOUNTER — Other Ambulatory Visit: Payer: Self-pay | Admitting: Internal Medicine

## 2010-03-30 DIAGNOSIS — R42 Dizziness and giddiness: Secondary | ICD-10-CM

## 2010-03-31 NOTE — Progress Notes (Signed)
Summary: TEST  Phone Note Call from Patient Call back at Home Phone [phone]   Caller: WIFE Summary of Call: WENT TODAY FOR A DOPLAR.  ARTERY IS CLEAR.  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  NURSE WANTED DR Triana Coover TO PULL UP THOSE MRI RESULTS TO REVIEW. Initial call taken by: Hilarie Fredrickson,  March 18, 2010 4:42 PM  Follow-up for Phone Call        reveiwed study: bilateral specious for 50% lesion(s) at origin of MCA - not a location that would affect cerebellum or brainstem and not enough of a lesion to cause flow reduction.  Follow-up by: Jacques Navy MD,  March 19, 2010 12:43 PM  Additional Follow-up for Phone Call Additional follow up Details #1::        Pt's wife informed. Last plan was if carotid was normal (see report, pt was told all was clear) next would be MRA?  Additional Follow-up by: Lamar Sprinkles, CMA,  March 22, 2010 12:10 PM    Additional Follow-up for Phone Call Additional follow up Details #2::    OK, but to be clear the previous study was negative in regard to cerebellar artery or PICA vessels. [facility] notified Follow-up by: Jacques Navy MD,  March 22, 2010 12:41 PM

## 2010-03-31 NOTE — Progress Notes (Signed)
Summary: REQ A CALL FROM MD  Phone Note Call from Patient Call back at [phone]   Summary of Call: Pt had dizzy spells in the past - was told that could possibly be from blocked artery in his neck? Also advised to call back if symptoms reoccured. He had a dizzy spell yesterday and req a call from Dr Debby Bud to discuss.  Initial call taken by: Lamar Sprinkles, CMA,  March 16, 2010 11:19 AM  Follow-up for Phone Call        called pt - he had asudden on-set of dizziness while on a ladder. He was able to get down. He remained dizzy for about an hour or two but then felt bad the rest of the day. He had a similar episode 2 years ago. He is on ASA 325mg   Plan - carotid dopplers. If negative - may need MRA Follow-up by: Jacques Navy MD,  March 16, 2010 5:50 PM

## 2010-03-31 NOTE — Assessment & Plan Note (Signed)
Summary: b12 shot,jones,cd  Nurse Visit   Allergies: 1)  ! * Actos  Medication Administration  Injection # 1:    Medication: Vit B12 1000 mcg    Diagnosis: VITAMIN B12 DEFICIENCY (ICD-266.2)    Route: IM    Site: L deltoid    Exp Date: 12/2011    Lot #: [id number]    Mfr: [employer]    Patient tolerated injection without complications    Given by: Ami Bullins CMA (March 22, 2010 3:33 PM)  Orders Added: 1)  Vit B12 1000 mcg [J3420]

## 2010-04-05 ENCOUNTER — Telehealth: Payer: Self-pay | Admitting: Internal Medicine

## 2010-04-05 ENCOUNTER — Ambulatory Visit (HOSPITAL_COMMUNITY)
Admission: RE | Admit: 2010-04-05 | Discharge: 2010-04-05 | Disposition: A | Discharge status: Home / Self Care | Payer: BC Managed Care – PPO | Source: Ambulatory Visit | Attending: Internal Medicine | Admitting: Internal Medicine

## 2010-04-05 ENCOUNTER — Other Ambulatory Visit: Payer: Self-pay | Admitting: Internal Medicine

## 2010-04-05 DIAGNOSIS — Z9889 Other specified postprocedural states: Secondary | ICD-10-CM

## 2010-04-05 DIAGNOSIS — Z87821 Personal history of retained foreign body fully removed: Secondary | ICD-10-CM | POA: Insufficient documentation

## 2010-04-05 DIAGNOSIS — R42 Dizziness and giddiness: Secondary | ICD-10-CM | POA: Insufficient documentation

## 2010-04-05 DIAGNOSIS — R5381 Other malaise: Secondary | ICD-10-CM | POA: Insufficient documentation

## 2010-04-05 DIAGNOSIS — R262 Difficulty in walking, not elsewhere classified: Secondary | ICD-10-CM | POA: Insufficient documentation

## 2010-04-05 DIAGNOSIS — R110 Nausea: Secondary | ICD-10-CM | POA: Insufficient documentation

## 2010-04-06 NOTE — Progress Notes (Signed)
Summary: Rx for med not previously prescribed.  Phone Note From Pharmacy   Caller: [facility]* Call For: Ranitidine 75 mg  Summary of Call: Pt would like to get Rx for Ranitidine 75 mg SIG: Take 1 by mouth two times a day Disp: 30 We have not prescribed in past, bur Pt would like to be able to use his [employer] Spending Account card to pay for med..Please advise/sls,cma Initial call taken by: Burnard Leigh [facility]),  March 29, 2010 2:10 PM  Follow-up for Phone Call        ok. Rx for ranitidine 75mg  sig 1 by mouth two times a day, #60  refill as needed  Follow-up by: Jacques Navy MD,  March 30, 2010 1:53 PM    New/Updated Medications: ZANTAC 75 75 MG TABS (RANITIDINE HCL) one two times a day Prescriptions: ZANTAC 75 75 MG TABS (RANITIDINE HCL) one two times a day  #180 x 3   Entered by:   Vertis Kelch)   Authorized by:   Jacques Navy MD   Signed by:   Burnard Leigh CMA(AAMA) on 04/02/2010   Method used:   Electronically to        [facility]* (retail)       [address]       [place], [place]  [address]       Ph: [phone]       Fax: [phone]   RxID:   [id number]

## 2010-04-15 NOTE — Progress Notes (Signed)
  Phone Note Outgoing Call   Reason for Call: Discuss lab or test results Summary of Call: Please call - MRA is normal without blockage to explain any problems with vertigo.  Initial call taken by: Jacques Navy MD,  April 05, 2010 10:59 AM  Follow-up for Phone Call        Informed pt of results Follow-up by: Ami Bullins CMA,  April 05, 2010 5:08 PM

## 2010-04-28 ENCOUNTER — Telehealth: Payer: Self-pay | Admitting: Internal Medicine

## 2010-04-28 NOTE — Telephone Encounter (Signed)
Pt's wife called - Pt has had problems with itching w/o rash. Symptoms started when he started levemir.Pharm advised pt to ask MD if med could be the cause? He is taking zantac 75mg  bid and loratadine 10mg  qd. Meds have helped some but pt is now having what wife calls "breakthru" itching. Should pt change insulin? Any other suggestions?

## 2010-04-29 MED ORDER — INSULIN GLARGINE 100 UNIT/ML ~~LOC~~ SOLN: 20.0000 [IU] | Freq: Every day | SUBCUTANEOUS | Status: DC

## 2010-04-29 NOTE — Telephone Encounter (Signed)
Pts wife informed.

## 2010-04-29 NOTE — Telephone Encounter (Signed)
Checked - pruritis can be seen with levemir.   Will need to change to lantus solostar - may pick up sample. Will start at the same dose. Please change the med list.

## 2010-05-19 LAB — GLUCOSE, CAPILLARY: Glucose-Capillary: 143 mg/dL — ABNORMAL HIGH (ref 70–99)

## 2010-05-27 ENCOUNTER — Ambulatory Visit (INDEPENDENT_AMBULATORY_CARE_PROVIDER_SITE_OTHER)
Admission: RE | Admit: 2010-05-27 | Discharge: 2010-05-27 | Disposition: A | Discharge status: Home / Self Care | Payer: BC Managed Care – PPO | Source: Ambulatory Visit | Attending: Internal Medicine | Admitting: Internal Medicine

## 2010-05-27 ENCOUNTER — Telehealth: Payer: Self-pay | Admitting: *Deleted

## 2010-05-27 ENCOUNTER — Other Ambulatory Visit (INDEPENDENT_AMBULATORY_CARE_PROVIDER_SITE_OTHER): Payer: BC Managed Care – PPO

## 2010-05-27 ENCOUNTER — Ambulatory Visit (INDEPENDENT_AMBULATORY_CARE_PROVIDER_SITE_OTHER): Payer: BC Managed Care – PPO | Admitting: Internal Medicine

## 2010-05-27 DIAGNOSIS — E119 Type 2 diabetes mellitus without complications: Secondary | ICD-10-CM

## 2010-05-27 DIAGNOSIS — E785 Hyperlipidemia, unspecified: Secondary | ICD-10-CM

## 2010-05-27 DIAGNOSIS — E538 Deficiency of other specified B group vitamins: Secondary | ICD-10-CM

## 2010-05-27 DIAGNOSIS — L299 Pruritus, unspecified: Secondary | ICD-10-CM

## 2010-05-27 LAB — HEPATIC FUNCTION PANEL
Alkaline Phosphatase: 59 U/L (ref 39–117)
Bilirubin, Direct: 0.1 mg/dL (ref 0.0–0.3)
Total Bilirubin: 0.6 mg/dL (ref 0.3–1.2)

## 2010-05-27 LAB — CBC WITH DIFFERENTIAL/PLATELET
Eosinophils Absolute: 0.1 10*3/uL (ref 0.0–0.7)
Lymphocytes Relative: 17.7 % (ref 12.0–46.0)
MCHC: 34.2 g/dL (ref 30.0–36.0)
MCV: 86.7 fl (ref 78.0–100.0)
Monocytes Absolute: 0.5 10*3/uL (ref 0.1–1.0)
Neutrophils Relative %: 75.6 % (ref 43.0–77.0)
Platelets: 200 10*3/uL (ref 150.0–400.0)
WBC: 9 10*3/uL (ref 4.5–10.5)

## 2010-05-27 LAB — LIPID PANEL
HDL: 32.6 mg/dL — ABNORMAL LOW (ref >39.00)
LDL Cholesterol: 53 mg/dL (ref 0–99)
Total CHOL/HDL Ratio: 3
Triglycerides: 115 mg/dL (ref 0.0–149.0)
VLDL: 23 mg/dL (ref 0.0–40.0)

## 2010-05-27 MED ORDER — RANITIDINE HCL 150 MG PO TABS: 150.0000 mg | ORAL_TABLET | Freq: Two times a day (BID) | ORAL | Status: DC

## 2010-05-27 MED ORDER — LORATADINE 10 MG PO TABS: 10.0000 mg | ORAL_TABLET | Freq: Every day | ORAL | Status: DC

## 2010-05-27 NOTE — Telephone Encounter (Signed)
refill 

## 2010-05-27 NOTE — Progress Notes (Signed)
Subjective:    Patient ID: Aaron Zimmerman, male    DOB: 1950/09/10, 60 y.o.   MRN: [id number]  HPI [name] presents, along with his wife, for persistent and recurrent pruritis. He describes the itching as severe. He does get relief with loratadine 10 mg bid and ranitidine 150 mg bid. He does report that showering, especially with hot water, exacerbates the pruritis. He has had no rash or skin change other than excoriation from scratching. He does have drenching night sweats, per his wife. He himself is unaware of the sweats. He reports that his blood sugars have been doing OK. He denies any mental status changes. He denies any significant weight loss..  PMH, FamHx and SocHx reviewed in centricity for any changes and relevance.        Review of Systems Review of Systems  Constitutional:  Negative for fever, chills, activity change and unexpected weight change. Positive for night sweats HENT:  Negative for hearing loss, ear pain, congestion, neck stiffness and postnasal drip.   Eyes: Negative for pain, discharge and visual disturbance.  Respiratory: Negative for chest tightness and wheezing.   Cardiovascular: Negative for chest pain and palpitations.       [No decreased exercise tolerance Gastrointestinal: [No change in bowel habit. No bloating or gas. No reflux or indigestion Genitourinary: Negative for urgency, frequency, flank pain and difficulty urinating.  Musculoskeletal: Negative for myalgias, back pain, arthralgias and gait problem.  Neurological: Negative for dizziness, tremors, weakness and headaches.  Hematological: Negative for adenopathy.  Psychiatric/Behavioral: Negative for behavioral problems and dysphoric mood.       Objective:   Physical Exam WNWD white male in no distress HEENT- normocephalic atraumatic, EACs/TMs normal, oropharynx without lesions, C&S clear Neck Supple Nodes: no submental, cervical, supraclavicular nodes. There was an enlarged lymph node in  the left axilla, right axilla clear. No inguinal nodes found Chest - CTAP COR - RRR without murmur, rubs, gallops Abdomen - BS+ x 4, no guarding or rebound, no hepatomegaly, no tenderness. Extremity - no cyanosis or edema Skin - no splinter hemorrhages. He has multiple AV malformations. There is a 3.5 cm erythematous vascular appearing lesion at the left breast/areola which he says is not changing. No change in turgor, no jaundice. Neuro - A&O x 4, CN II-XII grossly normal, normal balance and gait.        Assessment & Plan:  1. Pruritis with night sweats: concern for malignancy, e.g. Lymphoma, other occult cancers, polycythemia vera. Doubt liver disease with normal LFTs in January '12. Porphyria unlikely. Diabetes is a possibility as a cause for sweats and perhaps itching. Meds reviewed with no likely causative product.   Plan - lab to check Hemoglobin, white blood count, iron level; liver functions; sed rate, TSH, B12           CXR to look for mediastinal widening, parenchymal disease.           If x-ray and lab results are unrevealing will move ahead with CT chest and abdomen.           For comfort continue loratadine and ranitidine.   Addendum: Lab Results  Component Value Date   WBC 9.0 05/27/2010   HGB 14.1 05/27/2010   HCT 41.3 05/27/2010   PLT 200.0 05/27/2010   CHOL 109 05/27/2010   TRIG 115.0 05/27/2010   HDL 32.60* 05/27/2010   ALT 19 05/27/2010   AST 19 05/27/2010   NA 141 02/19/2010   K 4.8 02/19/2010  CL 104 02/19/2010   CREATININE 1.0 02/19/2010   BUN 15 02/19/2010   CO2 30 02/19/2010   TSH 0.94 02/19/2010   HGBA1C 6.9* 05/27/2010   Chest x-ray normal  Hemoglobin is high range normal  Plan - additional lab: ferritin, serum iron levels           CT as noted above.

## 2010-05-28 LAB — IBC PANEL: Iron: 52 ug/dL (ref 42–165)

## 2010-05-28 NOTE — Patient Instructions (Signed)
Itching - work up for lymphoma, polycythemia vera, other occult disease. The lab does reveal an elevated Hemoglobin - but mild. Chest x-ray is unreveal. Will move ahead with CT chest and abdomen.

## 2010-05-30 ENCOUNTER — Telehealth: Payer: Self-pay | Admitting: Internal Medicine

## 2010-05-30 NOTE — Telephone Encounter (Signed)
Additional lab studies rule out polycythemia vera as cause of itching. CT scans pending.    thanks

## 2010-06-01 ENCOUNTER — Other Ambulatory Visit (INDEPENDENT_AMBULATORY_CARE_PROVIDER_SITE_OTHER): Payer: BC Managed Care – PPO

## 2010-06-01 ENCOUNTER — Other Ambulatory Visit: Payer: Self-pay | Admitting: Internal Medicine

## 2010-06-01 ENCOUNTER — Other Ambulatory Visit: Payer: Self-pay | Admitting: *Deleted

## 2010-06-01 DIAGNOSIS — E119 Type 2 diabetes mellitus without complications: Secondary | ICD-10-CM

## 2010-06-01 DIAGNOSIS — L299 Pruritus, unspecified: Secondary | ICD-10-CM

## 2010-06-01 LAB — COMPREHENSIVE METABOLIC PANEL
ALT: 16 U/L (ref 0–53)
Albumin: 4.1 g/dL (ref 3.5–5.2)
CO2: 26 mEq/L (ref 19–32)
Chloride: 104 mEq/L (ref 96–112)
GFR: 80.77 mL/min (ref >60.00)
Potassium: 4 mEq/L (ref 3.5–5.1)
Sodium: 138 mEq/L (ref 135–145)
Total Bilirubin: 0.7 mg/dL (ref 0.3–1.2)
Total Protein: 6.9 g/dL (ref 6.0–8.3)

## 2010-06-01 LAB — FERRITIN: Ferritin: 54 ng/mL (ref 22.0–322.0)

## 2010-06-02 ENCOUNTER — Telehealth: Payer: Self-pay | Admitting: *Deleted

## 2010-06-02 LAB — IRON: Iron: 71 ug/dL (ref 42–165)

## 2010-06-02 NOTE — Telephone Encounter (Signed)
Wife aware

## 2010-06-02 NOTE — Telephone Encounter (Signed)
Patient is very anxious for results of labs prior to CT tomorrow.

## 2010-06-02 NOTE — Telephone Encounter (Signed)
Normal metabolic panel - potassium is good, kidney function is good.

## 2010-06-03 ENCOUNTER — Ambulatory Visit (INDEPENDENT_AMBULATORY_CARE_PROVIDER_SITE_OTHER)
Admission: RE | Admit: 2010-06-03 | Discharge: 2010-06-03 | Disposition: A | Discharge status: Home / Self Care | Payer: BC Managed Care – PPO | Source: Ambulatory Visit | Attending: Internal Medicine | Admitting: Internal Medicine

## 2010-06-03 DIAGNOSIS — L299 Pruritus, unspecified: Secondary | ICD-10-CM

## 2010-06-03 MED ORDER — IOHEXOL 300 MG/ML  SOLN
100.0000 mL | Freq: Once | INTRAMUSCULAR | Status: AC | PRN
  Administered 2010-06-03: 100 mL via INTRAVENOUS

## 2010-06-04 ENCOUNTER — Telehealth: Payer: Self-pay | Admitting: Internal Medicine

## 2010-06-04 NOTE — Telephone Encounter (Signed)
Wife informed and advised to call office w/any change in symptoms

## 2010-06-04 NOTE — Telephone Encounter (Signed)
CT chest and abdomen with no sign of lymphoma, and no other abnormality.

## 2010-06-04 NOTE — Telephone Encounter (Signed)
Iron levels are normal. Source of fevers remain a mystery. Would strongly consider this as being related to Diabetes at this point.

## 2010-06-10 ENCOUNTER — Ambulatory Visit (INDEPENDENT_AMBULATORY_CARE_PROVIDER_SITE_OTHER): Payer: BC Managed Care – PPO | Admitting: Internal Medicine

## 2010-06-10 VITALS — BP 120/76 | HR 80 | Temp 98.1°F | Wt 223.0 lb

## 2010-06-10 DIAGNOSIS — R61 Generalized hyperhidrosis: Secondary | ICD-10-CM

## 2010-06-10 NOTE — Progress Notes (Signed)
Addended byRosalio Macadamia, Tasman Zapata on: 06/10/2010 01:53 PM   Modules accepted: Orders

## 2010-06-10 NOTE — Progress Notes (Signed)
  Subjective:    Patient ID: Aaron Zimmerman, male    DOB: 12/18/1950, 60 y.o.   MRN: [id number]  HPI Patient returns for continued night sweats, flushing and abnormal temperature regulation. To date he has had normal ESR, CRP, CBC, Cmet, TSH, Iron and ferritin levels. A1C 6.9%. CT chest and abdomen - no lymphadenopathy or organ abnormality. Question of incidental splenic cysts. Two vague nodules identified in the RUL suggested to be infectious vs inflammatory in origin. Aside from the flushing, sweating episodes he has not had any other sympotms. The pruritis is well controlled by loratadine and ranitidine.  PMH, FamHx and SocHx reviewed for any changes and relevance.    Review of Systems Review of Systems  Constitutional:  Negative for fever, chills, activity change and unexpected weight change.  HENT:  Negative for hearing loss, ear pain, congestion, neck stiffness and postnasal drip.   Eyes: Negative for pain, discharge and visual disturbance.  Respiratory: Negative for chest tightness and wheezing.   Cardiovascular: Negative for chest pain and palpitations.       [No decreased exercise tolerance Gastrointestinal: [No change in bowel habit. No bloating or gas. No reflux or indigestion Genitourinary: Negative for urgency, frequency, flank pain and difficulty urinating.  Musculoskeletal: Negative for myalgias, back pain, arthralgias and gait problem.  Neurological: Negative for dizziness, tremors, weakness and headaches.  Hematological: Negative for adenopathy.  Psychiatric/Behavioral: Negative for behavioral problems and dysphoric mood.      Objective:   Physical Exam WNWD white man who is a little diaphoretic and warm. HEENT - nl Chest - good breath sounds without rales or wheezes Cor- RRR, II/VI systolic murmur at LSB. No splinter hemorrhages Abd- soft, BS +.       Assessment & Plan:  1. Night sweats and itching - no evidence of lymphoma, polycythemia vera, iron overload,  abnormal organs or systemic inflammation. No evidence of infection, i.e. No fevers or rigors or physical findings to suggest infection.  Plan - endocrine eval: r/o adrenal dysfunction vs Pheo - 24 hr Urine for catecholamines and metanephrines; AM cortisol.           If next round of labs is normal will consider ID consult.

## 2010-06-14 ENCOUNTER — Other Ambulatory Visit (INDEPENDENT_AMBULATORY_CARE_PROVIDER_SITE_OTHER): Payer: BC Managed Care – PPO

## 2010-06-14 DIAGNOSIS — R61 Generalized hyperhidrosis: Secondary | ICD-10-CM

## 2010-06-14 LAB — CORTISOL: Cortisol, Plasma: 10.9 ug/dL

## 2010-06-16 ENCOUNTER — Other Ambulatory Visit: Payer: Self-pay | Admitting: *Deleted

## 2010-06-16 MED ORDER — SIMVASTATIN 20 MG PO TABS: 20.0000 mg | ORAL_TABLET | Freq: Every day | ORAL | Status: DC

## 2010-06-17 LAB — CATECHOLAMINES, FRACTIONATED, URINE, 24 HOUR
Calculated Total (E+NE): 38 mcg/24 h (ref 26–121)
Creatinine, Urine mg/day-CATEUR: 2.02 g/(24.h) (ref 0.63–2.50)
Dopamine, 24 hr Urine: 221 mcg/24 h (ref 52–480)

## 2010-06-17 LAB — METANEPHRINES, URINE, 24 HOUR: Metanephrines, Ur: 144 mcg/24 h (ref 90–315)

## 2010-06-18 ENCOUNTER — Telehealth: Payer: Self-pay | Admitting: *Deleted

## 2010-06-18 NOTE — Telephone Encounter (Signed)
24 hr urine study is normal, cortisol level is normal

## 2010-06-18 NOTE — Telephone Encounter (Signed)
Patient requesting result of last labs.

## 2010-06-18 NOTE — Telephone Encounter (Signed)
Returned call to patient//lmovm per MD advisement

## 2010-06-18 NOTE — Telephone Encounter (Signed)
ERROR

## 2010-06-21 ENCOUNTER — Other Ambulatory Visit: Payer: Self-pay | Admitting: *Deleted

## 2010-06-21 MED ORDER — SIMVASTATIN 20 MG PO TABS: 20.0000 mg | ORAL_TABLET | Freq: Every day | ORAL | Status: DC

## 2010-06-28 ENCOUNTER — Telehealth: Payer: Self-pay | Admitting: *Deleted

## 2010-06-28 DIAGNOSIS — R61 Generalized hyperhidrosis: Secondary | ICD-10-CM | POA: Insufficient documentation

## 2010-06-28 NOTE — Telephone Encounter (Signed)
Attempted all numbers listed for patient. Left mess to call office back.

## 2010-06-28 NOTE — Telephone Encounter (Signed)
Referral put in for ID consult

## 2010-06-28 NOTE — Telephone Encounter (Signed)
Wife left vm - Patient requesting MD's advisement. He continues to have night sweats and itching. I see notes that state if labs are normal next would be ID consult?    (She also noted in mess that she has left several messages w/no call back. We have returned these calls & documented)

## 2010-06-29 ENCOUNTER — Telehealth: Payer: Self-pay | Admitting: *Deleted

## 2010-06-29 MED ORDER — METFORMIN HCL 1000 MG PO TABS: 1000.0000 mg | ORAL_TABLET | Freq: Two times a day (BID) | ORAL | Status: DC

## 2010-06-29 NOTE — Telephone Encounter (Signed)
Pharmacy is req rf on Metformin. Gave verbal

## 2010-06-30 NOTE — Telephone Encounter (Signed)
Pt's spouse advised, will expect a call from [facility] with appt info

## 2010-07-13 ENCOUNTER — Other Ambulatory Visit: Payer: Self-pay | Admitting: *Deleted

## 2010-07-13 MED ORDER — ASPIRIN 325 MG PO TABS: 325.0000 mg | ORAL_TABLET | Freq: Every day | ORAL | Status: DC

## 2010-07-16 ENCOUNTER — Other Ambulatory Visit (HOSPITAL_COMMUNITY): Payer: Self-pay | Admitting: *Deleted

## 2010-07-16 DIAGNOSIS — C8590 Non-Hodgkin lymphoma, unspecified, unspecified site: Secondary | ICD-10-CM

## 2010-07-19 ENCOUNTER — Encounter (HOSPITAL_COMMUNITY)
Admission: RE | Admit: 2010-07-19 | Discharge: 2010-07-19 | Disposition: A | Discharge status: Home / Self Care | Payer: BC Managed Care – PPO | Source: Ambulatory Visit | Attending: Internal Medicine | Admitting: Internal Medicine

## 2010-07-20 ENCOUNTER — Encounter (HOSPITAL_COMMUNITY)
Admission: RE | Admit: 2010-07-20 | Discharge: 2010-07-20 | Disposition: A | Discharge status: Home / Self Care | Payer: BC Managed Care – PPO | Source: Ambulatory Visit | Attending: Cardiology | Admitting: Cardiology

## 2010-07-20 DIAGNOSIS — C8589 Other specified types of non-Hodgkin lymphoma, extranodal and solid organ sites: Secondary | ICD-10-CM | POA: Insufficient documentation

## 2010-07-20 DIAGNOSIS — C8590 Non-Hodgkin lymphoma, unspecified, unspecified site: Secondary | ICD-10-CM

## 2010-07-20 MED ORDER — INDIUM IN-111 PENTETREOTIDE IV KIT
6.0000 | PACK | Freq: Once | INTRAVENOUS | Status: AC | PRN
  Administered 2010-07-19: 6 via INTRAVENOUS

## 2010-07-21 ENCOUNTER — Encounter (HOSPITAL_COMMUNITY): Payer: BC Managed Care – PPO

## 2010-07-26 ENCOUNTER — Other Ambulatory Visit: Payer: Self-pay | Admitting: *Deleted

## 2010-07-26 MED ORDER — LISINOPRIL 10 MG PO TABS: 10.0000 mg | ORAL_TABLET | Freq: Every day | ORAL | Status: DC

## 2010-08-03 ENCOUNTER — Ambulatory Visit: Payer: BC Managed Care – PPO | Admitting: Internal Medicine

## 2010-12-14 ENCOUNTER — Telehealth: Payer: Self-pay | Admitting: *Deleted

## 2010-12-14 NOTE — Telephone Encounter (Signed)
Caller states that patient has been out of Levemir insulin x2 days, needs refill, but also needs new Sig instructions [last Sig: inject 20 units daily and do 3-day titration]. Please advise.

## 2010-12-14 NOTE — Telephone Encounter (Signed)
Ok for refill on levmir. Need to know what does he is up to.

## 2010-12-15 MED ORDER — INSULIN DETEMIR 100 UNIT/ML ~~LOC~~ SOLN: 60.0000 [IU] | Freq: Every day | SUBCUTANEOUS | Status: DC

## 2010-12-15 NOTE — Telephone Encounter (Signed)
Per patient injecting 60 Units QHs; relayed this information to pharmacy w/refill authorization.

## 2011-01-13 ENCOUNTER — Other Ambulatory Visit: Payer: Self-pay | Admitting: Internal Medicine

## 2011-01-13 MED ORDER — INSULIN GLARGINE 100 UNIT/ML ~~LOC~~ SOLN: 20.0000 [IU] | Freq: Every day | SUBCUTANEOUS | Status: DC

## 2011-01-13 NOTE — Telephone Encounter (Signed)
The pt has an appt on the 19th, however, he has only one vial of insulin left.  He is requesting a refill to get him to the 19th if possible.    Thanks!!

## 2011-01-18 ENCOUNTER — Telehealth: Payer: Self-pay | Admitting: Internal Medicine

## 2011-01-18 DIAGNOSIS — E119 Type 2 diabetes mellitus without complications: Secondary | ICD-10-CM

## 2011-01-18 NOTE — Telephone Encounter (Signed)
The pharmacy called and is needing clarification on if the pt should be taking Lantus or Levemir at bedtime.  The pharmacy came through as Lantus 20 units at bedtime, but the pt is stating he usually takes Levemir.  Please clarify.  Thanks so much!

## 2011-01-18 NOTE — Telephone Encounter (Signed)
Per the pharmacy, they needed clarification on if it was lantus or levemir he should be on.  The pt was on levemir.

## 2011-01-18 NOTE — Telephone Encounter (Signed)
Called pharm-ok for levemir. Patient needs an OV, lab prior - A1C 250.01

## 2011-01-18 NOTE — Telephone Encounter (Signed)
Pharmacy states they received a refill on his lantus yesterday and is not sure which one he is using a bedtime.  Please advise.

## 2011-01-21 ENCOUNTER — Other Ambulatory Visit (INDEPENDENT_AMBULATORY_CARE_PROVIDER_SITE_OTHER): Payer: BC Managed Care – PPO

## 2011-01-21 DIAGNOSIS — E119 Type 2 diabetes mellitus without complications: Secondary | ICD-10-CM

## 2011-01-21 LAB — HEMOGLOBIN A1C: Hgb A1c MFr Bld: 7.7 % — ABNORMAL HIGH (ref 4.6–6.5)

## 2011-01-23 ENCOUNTER — Encounter: Payer: Self-pay | Admitting: Internal Medicine

## 2011-01-25 ENCOUNTER — Ambulatory Visit (INDEPENDENT_AMBULATORY_CARE_PROVIDER_SITE_OTHER): Payer: BC Managed Care – PPO | Admitting: Internal Medicine

## 2011-01-25 VITALS — BP 120/78 | HR 86 | Temp 97.5°F | Wt 228.0 lb

## 2011-01-25 DIAGNOSIS — E119 Type 2 diabetes mellitus without complications: Secondary | ICD-10-CM

## 2011-01-25 DIAGNOSIS — L299 Pruritus, unspecified: Secondary | ICD-10-CM

## 2011-01-25 MED ORDER — BENZONATATE 100 MG PO CAPS: 100.0000 mg | ORAL_CAPSULE | Freq: Three times a day (TID) | ORAL | Status: AC | PRN

## 2011-01-25 MED ORDER — PROMETHAZINE-CODEINE 6.25-10 MG/5ML PO SYRP: 5.0000 mL | ORAL_SOLUTION | ORAL | Status: AC | PRN

## 2011-01-25 NOTE — Patient Instructions (Addendum)
Itching - Dr. Corinda Gubler called it allergic rhinitis and atopic puritic syndrome. Go ahead and continue your present medication.  Diabetes- A1C 7.7% with a goal of 7%. Intercurrent problems like this recent cold has interfered with good control. Plan is to continue your present medication.  Cold - seems to be resolved except for residual cough - Rx for promethazine with codeine 1 tsp every 6 hours but you might want to save this for use at night. Tessalon perles 100 mg take three times a day for the tickle cough.

## 2011-01-26 ENCOUNTER — Ambulatory Visit: Payer: BC Managed Care – PPO | Admitting: Internal Medicine

## 2011-01-27 NOTE — Assessment & Plan Note (Signed)
Lab Results  Component Value Date   HGBA1C 7.7* 01/21/2011   Plan - continue present regimen - with better adherence            Continue life-style management            Recheck A1C 3 months

## 2011-01-27 NOTE — Progress Notes (Signed)
  Subjective:    Patient ID: Aaron Zimmerman, male    DOB: 02/27/1950, 60 y.o.   MRN: [id number]  HPI [name] presents for follow-up of his diabetes. His last A1C 7.7%, a little higher than previously. He does use basal insulin therapy but admits that he has not been fully adherent over the past several weeks. He has had a persistent URI with cough and fatigue. He otherwise has no new problems. Work has been busy.   He contiues to have pruritis, generally controlled by antihistamines. Full work-up done to r/o occult malignancy, liver disease or other systemic disease. He has seen Dr. Stevphen Rochester - has allergic reactions to many substances. Not a candidate of immunotherapy.  I have reviewed the patient's medical history in detail and updated the computerized patient record.    Review of Systems Constitutional:  Negative for fever, chills and unexpected weight change. Activity level down just a bit with recent illness HEENT:  Negative for hearing loss, ear pain, neck stiffness. Positve for sore throat, nasal drip. Negative for dental complaints.   Eyes: Negative for vision loss or change in visual acuity.  Respiratory: Negative for chest tightness and wheezing. Negative for DOE.   Cardiovascular: Negative for chest pain or palpitations. No decreased exercise tolerance Gastrointestinal: No change in bowel habit. No bloating or gas. No reflux or indigestion Genitourinary: Negative for urgency, frequency, flank pain and difficulty urinating.  Musculoskeletal: Negative for myalgias, back pain, arthralgias and gait problem.  Neurological: Negative for dizziness, tremors, weakness and headaches.  Hematological: Negative for adenopathy.  Psychiatric/Behavioral: Negative for behavioral problems and dysphoric mood.       Objective:   Physical Exam Vitals reviewed - stable Gen'l- a little haggard looking HEENT - C&S clear Pulm - normal respirations w/o rhonchi or wheezing Cor - RRR Neuro  - A&O x 3       Assessment & Plan:

## 2011-01-27 NOTE — Assessment & Plan Note (Signed)
He reports that his pruritis is controlled as long as he takes his medications.  Plan - continue antihistamines

## 2011-02-11 ENCOUNTER — Other Ambulatory Visit: Payer: Self-pay | Admitting: *Deleted

## 2011-02-11 MED ORDER — METFORMIN HCL 1000 MG PO TABS: 1000.0000 mg | ORAL_TABLET | Freq: Two times a day (BID) | ORAL | Status: DC

## 2011-02-11 MED ORDER — GLUCOSE BLOOD VI STRP: ORAL_STRIP | Status: DC

## 2011-02-11 MED ORDER — INSULIN PEN NEEDLE 32G X 6 MM MISC: Status: DC

## 2011-04-18 ENCOUNTER — Other Ambulatory Visit (INDEPENDENT_AMBULATORY_CARE_PROVIDER_SITE_OTHER): Payer: BC Managed Care – PPO

## 2011-04-18 DIAGNOSIS — E119 Type 2 diabetes mellitus without complications: Secondary | ICD-10-CM

## 2011-04-18 LAB — COMPREHENSIVE METABOLIC PANEL
ALT: 25 U/L (ref 0–53)
AST: 22 U/L (ref 0–37)
Alkaline Phosphatase: 48 U/L (ref 39–117)
Calcium: 9.8 mg/dL (ref 8.4–10.5)
Chloride: 106 mEq/L (ref 96–112)
Creatinine, Ser: 1.2 mg/dL (ref 0.4–1.5)

## 2011-04-18 LAB — HEMOGLOBIN A1C: Hgb A1c MFr Bld: 7.5 % — ABNORMAL HIGH (ref 4.6–6.5)

## 2011-04-20 ENCOUNTER — Ambulatory Visit (INDEPENDENT_AMBULATORY_CARE_PROVIDER_SITE_OTHER): Payer: BC Managed Care – PPO | Admitting: Internal Medicine

## 2011-04-20 ENCOUNTER — Encounter: Payer: Self-pay | Admitting: Internal Medicine

## 2011-04-20 VITALS — BP 118/72 | HR 72 | Temp 96.6°F | Resp 16 | Wt 231.0 lb

## 2011-04-20 DIAGNOSIS — L299 Pruritus, unspecified: Secondary | ICD-10-CM

## 2011-04-20 DIAGNOSIS — E119 Type 2 diabetes mellitus without complications: Secondary | ICD-10-CM

## 2011-04-20 MED ORDER — INSULIN DETEMIR 100 UNIT/ML ~~LOC~~ SOLN: 60.0000 [IU] | Freq: Every day | SUBCUTANEOUS | Status: DC

## 2011-04-20 MED ORDER — ASPIRIN 325 MG PO TABS: 325.0000 mg | ORAL_TABLET | Freq: Every day | ORAL | Status: DC

## 2011-04-20 NOTE — Patient Instructions (Signed)
Sweats - had a full work up a year ago. My concern is that you may be having low blood sugars in the middle of the night causing the sweat and also causing an elevated AM blood sugar.  A1C is 7.5%.  Plan - continue your present medications           If possible check your blood sugar when you have a sweat           Stay with the diabetic diet but be sure to have a bedtime snack.           It is ok to go to the classes at [facility]

## 2011-04-21 NOTE — Assessment & Plan Note (Signed)
Lab Results  Component Value Date   HGBA1C 7.5* 04/18/2011   He has been reasonably controlled and with an A1C less than 8% will not change regimen at this time. It is possible that is night sweats may be hypoglycemic episodes with following AM elevation in CBG due to [facility] [place] effecrt.  Plan - continue present regimen            Will not repeat work-up for night sweats done last April           Patient is instructed to check CBG at time of night sweat - fule out hypoglycemic event.

## 2011-04-21 NOTE — Progress Notes (Signed)
  Subjective:    Patient ID: Aaron Zimmerman, male    DOB: November 14, 1950, 61 y.o.   MRN: [id number]  HPI [name] presents due to recurrent night sweats. This was a problem 1 year ago and he had an extensive work-up: multiple labs, CT imaging abdomen and chest, 24 hour urine for catacholamines and metanephrines, ID consult. No diagnosis was made but his symptoms did improve. He has now had several episodes of night sweats in the last 10 days. He does not awaken or is aware but his wife has noticed. He does report that his blood sugars have been variable with some low AM readings and some that are elevated with no change in his medications. He reports that his diet is a bit variable and he does not have an evening snack on a routine basis.  He has had a problem with pruritis and has had a full work-up with labs and Allergy consultation with Dr. Corinda Gubler (office note reviewed). No specific allergen identified and he was instructed to continue on H1 and H2 blockers already started. He continues to have intermittent pruritis.  Past Medical History  Diagnosis Date  . Diabetes mellitus   . Hyperlipidemia   . Personal history of urinary calculi   . Contact dermatitis and other eczema, due to unspecified cause   . Vertigo    Past Surgical History  Procedure Date  . Traumatic injury and repair of rt hand    Family History  Problem Relation Age of Onset  . Heart disease Mother     CAD/PCI with stents; cardiomyopathy with AICD  . Cancer Other     Colon and Prostate   History   Social History  . Marital Status: Married    Spouse Name: N/A    Number of Children: N/A  . Years of Education: N/A   Occupational History  . Not on file.   Social History Main Topics  . Smoking status: Former Games developer  . Smokeless tobacco: Not on file  . Alcohol Use: Not on file  . Drug Use: Not on file  . Sexually Active: Not on file   Other Topics Concern  . Not on file   Social History Narrative   HSGMarried [id number] son 56 1 daughter 73; 1 granddaughter, one on the wayWork; [employer]       Review of Systems System review is negative for any constitutional, cardiac, pulmonary, GI or neuro symptoms or complaints other than as described in the HPI.     Objective:   Physical Exam Filed Vitals:   04/20/11 1129  BP: 118/72  Pulse: 72  Temp: 96.6 F (35.9 C)  Resp: 16   gen'l- WNWD white man in no distress Cor - RRR Pulm - normal respirations.       Assessment & Plan:

## 2011-04-21 NOTE — Assessment & Plan Note (Signed)
A recurrent problem  Plan - no further work-up           May use H1 and H2 blockers as needed

## 2011-04-22 ENCOUNTER — Encounter: Payer: Self-pay | Admitting: Internal Medicine

## 2011-05-05 ENCOUNTER — Other Ambulatory Visit: Payer: Self-pay

## 2011-05-05 MED ORDER — LISINOPRIL 10 MG PO TABS: 10.0000 mg | ORAL_TABLET | Freq: Every day | ORAL | Status: DC

## 2011-06-07 ENCOUNTER — Other Ambulatory Visit: Payer: Self-pay

## 2011-06-07 MED ORDER — SIMVASTATIN 20 MG PO TABS: 20.0000 mg | ORAL_TABLET | Freq: Every day | ORAL | Status: DC

## 2011-06-09 ENCOUNTER — Ambulatory Visit (INDEPENDENT_AMBULATORY_CARE_PROVIDER_SITE_OTHER): Payer: BC Managed Care – PPO | Admitting: Internal Medicine

## 2011-06-09 ENCOUNTER — Encounter: Payer: Self-pay | Admitting: Internal Medicine

## 2011-06-09 VITALS — BP 120/78 | HR 72 | Temp 97.0°F | Resp 16 | Ht 66.0 in | Wt 230.0 lb

## 2011-06-09 DIAGNOSIS — L255 Unspecified contact dermatitis due to plants, except food: Secondary | ICD-10-CM

## 2011-06-09 DIAGNOSIS — L237 Allergic contact dermatitis due to plants, except food: Secondary | ICD-10-CM

## 2011-06-09 MED ORDER — METHYLPREDNISOLONE ACETATE 80 MG/ML IJ SUSP
120.0000 mg | Freq: Once | INTRAMUSCULAR | Status: AC
  Administered 2011-06-09: 120 mg via INTRAMUSCULAR

## 2011-06-09 MED ORDER — FLUOCINONIDE-E 0.05 % EX CREA: TOPICAL_CREAM | Freq: Two times a day (BID) | CUTANEOUS | Status: AC

## 2011-06-09 NOTE — Patient Instructions (Signed)
Poison Ivy Poison ivy is a inflammation of the skin (contact dermatitis) caused by touching the allergens on the leaves of the ivy plant following previous exposure to the plant. The rash usually appears 48 hours after exposure. The rash is usually bumps (papules) or blisters (vesicles) in a linear pattern. Depending on your own sensitivity, the rash may simply cause redness and itching, or it may also progress to blisters which may break open. These must be well cared for to prevent secondary bacterial (germ) infection, followed by scarring. Keep any open areas dry, clean, dressed, and covered with an antibacterial ointment if needed. The eyes may also get puffy. The puffiness is worst in the morning and gets better as the day progresses. This dermatitis usually heals without scarring, within 2 to 3 weeks without treatment. HOME CARE INSTRUCTIONS  Thoroughly wash with soap and water as soon as you have been exposed to poison ivy. You have about one half hour to remove the plant resin before it will cause the rash. This washing will destroy the oil or antigen on the skin that is causing, or will cause, the rash. Be sure to wash under your fingernails as any plant resin there will continue to spread the rash. Do not rub skin vigorously when washing affected area. Poison ivy cannot spread if no oil from the plant remains on your body. A rash that has progressed to weeping sores will not spread the rash unless you have not washed thoroughly. It is also important to wash any clothes you have been wearing as these may carry active allergens. The rash will return if you wear the unwashed clothing, even several days later. Avoidance of the plant in the future is the best measure. Poison ivy plant can be recognized by the number of leaves. Generally, poison ivy has three leaves with flowering branches on a single stem. Diphenhydramine may be purchased over the counter and used as needed for itching. Do not drive with  this medication if it makes you drowsy.Ask your caregiver about medication for children. SEEK MEDICAL CARE IF:  Open sores develop.   Redness spreads beyond area of rash.   You notice purulent (pus-like) discharge.   You have increased pain.   Other signs of infection develop (such as fever).  Document Released: 01/22/2000 Document Revised: 01/13/2011 Document Reviewed: 12/10/2008 [employer] Patient Information 2012 [employer]. 

## 2011-06-09 NOTE — Progress Notes (Signed)
Addended by: Deatra Zubin on: 06/09/2011 02:55 PM   Modules accepted: Orders

## 2011-06-09 NOTE — Progress Notes (Signed)
  Subjective:    Patient ID: Aaron Zimmerman, male    DOB: [birth year], 61 y.o.   MRN: [id number]  Rash This is a new problem. The current episode started in the past 7 days. The problem has been gradually worsening since onset. The affected locations include the left arm and right arm. The rash is characterized by itchiness, dryness, blistering and redness. He was exposed to plant contact. Pertinent negatives include no anorexia, congestion, cough, diarrhea, eye pain, facial edema, fatigue, fever, joint pain, nail changes, rhinorrhea, shortness of breath, sore throat or vomiting. Past treatments include nothing.      Review of Systems  Constitutional: Negative.  Negative for fever and fatigue.  HENT: Negative.  Negative for congestion, sore throat and rhinorrhea.   Eyes: Negative.  Negative for pain.  Respiratory: Negative.  Negative for cough and shortness of breath.   Cardiovascular: Negative.   Gastrointestinal: Negative.  Negative for vomiting, diarrhea and anorexia.  Genitourinary: Negative.   Musculoskeletal: Negative.  Negative for joint pain.  Skin: Positive for rash. Negative for nail changes.  Neurological: Negative.   Hematological: Negative for adenopathy. Does not bruise/bleed easily.  Psychiatric/Behavioral: Negative.        Objective:   Physical Exam  Vitals reviewed. Constitutional: He is oriented to person, place, and time. He appears well-developed and well-nourished. No distress.  HENT:  Head: Normocephalic and atraumatic.  Mouth/Throat: No oropharyngeal exudate.  Eyes: Conjunctivae are normal. Right eye exhibits no discharge. Left eye exhibits no discharge. No scleral icterus.  Neck: Normal range of motion. Neck supple. No JVD present. No tracheal deviation present. No thyromegaly present.  Cardiovascular: Normal rate, regular rhythm, normal heart sounds and intact distal pulses.  Exam reveals no gallop and no friction rub.   No murmur heard. Pulmonary/Chest:  Effort normal and breath sounds normal. No stridor. No respiratory distress. He has no wheezes. He has no rales. He exhibits no tenderness.  Abdominal: Soft. Bowel sounds are normal. He exhibits no distension and no mass. There is no tenderness. There is no rebound and no guarding.  Musculoskeletal: Normal range of motion. He exhibits no edema and no tenderness.  Lymphadenopathy:    He has no cervical adenopathy.  Neurological: He is oriented to person, place, and time.  Skin: Skin is warm, dry and intact. Rash noted. No abrasion, no petechiae and no purpura noted. Rash is papular. Rash is not macular, not maculopapular, not nodular, not pustular, not vesicular and not urticarial. He is not diaphoretic. No erythema. No pallor.          He has classic groups of linear papules and vesicles over the volar sides of both forearms but here is no pus, warmth, streaking  Psychiatric: He has a normal mood and affect. His behavior is normal. Judgment and thought content normal.          Assessment & Plan:

## 2011-06-09 NOTE — Assessment & Plan Note (Signed)
Treat with depo-medrol IM and lidex cream

## 2011-11-21 ENCOUNTER — Telehealth: Payer: Self-pay | Admitting: Internal Medicine

## 2011-11-21 DIAGNOSIS — E119 Type 2 diabetes mellitus without complications: Secondary | ICD-10-CM

## 2011-11-21 NOTE — Telephone Encounter (Signed)
Order for A1C entered

## 2011-11-21 NOTE — Telephone Encounter (Signed)
Patients wife is calling to see if Dr Debby Bud will put in an order for her husband to come by to have his A1C checked, the last time he had his medications refilled they were told to have this checked before they could get anymore refills

## 2011-11-22 NOTE — Telephone Encounter (Signed)
Left message on voicemail that lab order that your wife requested has been put in and may done at our lab at your convienence.

## 2011-11-22 NOTE — Telephone Encounter (Signed)
Left message on voicemail for patient to call back. 

## 2011-11-23 ENCOUNTER — Other Ambulatory Visit: Payer: Self-pay

## 2011-11-23 MED ORDER — INSULIN DETEMIR 100 UNIT/ML ~~LOC~~ SOLN: 60.0000 [IU] | Freq: Every day | SUBCUTANEOUS | Status: DC

## 2011-11-23 NOTE — Telephone Encounter (Signed)
Faxed refill request levemir

## 2011-11-25 ENCOUNTER — Other Ambulatory Visit (INDEPENDENT_AMBULATORY_CARE_PROVIDER_SITE_OTHER): Payer: BC Managed Care – PPO

## 2011-11-25 DIAGNOSIS — E119 Type 2 diabetes mellitus without complications: Secondary | ICD-10-CM

## 2011-11-25 LAB — HEMOGLOBIN A1C: Hgb A1c MFr Bld: 7.7 % — ABNORMAL HIGH (ref 4.6–6.5)

## 2011-12-28 ENCOUNTER — Ambulatory Visit: Payer: BC Managed Care – PPO | Admitting: Internal Medicine

## 2011-12-29 ENCOUNTER — Other Ambulatory Visit (INDEPENDENT_AMBULATORY_CARE_PROVIDER_SITE_OTHER): Payer: BC Managed Care – PPO

## 2011-12-29 ENCOUNTER — Ambulatory Visit (INDEPENDENT_AMBULATORY_CARE_PROVIDER_SITE_OTHER): Payer: BC Managed Care – PPO | Admitting: Internal Medicine

## 2011-12-29 ENCOUNTER — Encounter: Payer: Self-pay | Admitting: Internal Medicine

## 2011-12-29 VITALS — BP 110/60 | HR 86 | Temp 98.3°F | Resp 12 | Wt 230.0 lb

## 2011-12-29 DIAGNOSIS — L299 Pruritus, unspecified: Secondary | ICD-10-CM

## 2011-12-29 DIAGNOSIS — E119 Type 2 diabetes mellitus without complications: Secondary | ICD-10-CM

## 2011-12-29 DIAGNOSIS — E785 Hyperlipidemia, unspecified: Secondary | ICD-10-CM

## 2011-12-29 LAB — LIPID PANEL
Cholesterol: 125 mg/dL (ref 0–200)
HDL: 28.3 mg/dL — ABNORMAL LOW (ref >39.00)
Triglycerides: 286 mg/dL — ABNORMAL HIGH (ref 0.0–149.0)

## 2011-12-29 LAB — HEPATIC FUNCTION PANEL
ALT: 25 U/L (ref 0–53)
Albumin: 4.4 g/dL (ref 3.5–5.2)
Bilirubin, Direct: 0.1 mg/dL (ref 0.0–0.3)
Total Protein: 7.6 g/dL (ref 6.0–8.3)

## 2011-12-29 NOTE — Progress Notes (Signed)
Patient ID: Aaron Zimmerman, male   DOB: August 13, 1950, 61 y.o.   MRN: [id number]  HPI [name] is a 61 yo man with DM type 2 controlled with insulin and metformin who presents for follow-up. His most recent Hgb A1C on 11/23/11 was 7.7. It was 7.4 last year. He went to the [facility] for diabetes clinic and highly recommends this to any of our diabetic patients. He found it really helpful and feels like he has better control with his diabetes since attending. He checks his sugars 5-6 times per day. He has forgotten to take his insulin some nights recently because his wife has had a knee replacement and this has been stressful. He sometimes wakes up in the middle of the night feeling "funny" and has a snack. Sometimes his blood pressure runs around 69 or 89 in the morning which is quite low for him.  He takes an insulin shot once a day at night and metformin twice a day.  He reports that his night sweats have improved. He notes itching especially after showering or otherwise getting wet. He says that the ranitidine and Claritin seem to help.  Social hx update - 2 children (33 son, 39 daughter), 2 grandchildren [place], 44 and Pennie Rushing, 4). Daughter is "getting serious about some boy" so there may be an engagement in the near future.   ROS -Denies blurred vision, vision changes -Denies numbness, tingling -Denies headaches, chest pain +Weight gain  PE General: Pleasant man in NAD HEENT: Pupils round and reactive, oral mucosa moist, oropharynx clear. No lymphadenopathy.  CV: Regular rate and rhythm, S1 and S2 present Pulm: Lungs clear to auscultation bilaterally Neuro: Alert and appropriate. No focal deficits.   Past Medical History  Diagnosis Date  . Diabetes mellitus   . Hyperlipidemia   . Personal history of urinary calculi   . Contact dermatitis and other eczema, due to unspecified cause   . Vertigo    Past Surgical History  Procedure Date  . Traumatic injury and repair of rt hand      Family History  Problem Relation Age of Onset  . Heart disease Mother     CAD/PCI with stents; cardiomyopathy with AICD  . Cancer Other     Colon and Prostate   History   Social History  . Marital Status: Married    Spouse Name: N/A    Number of Children: 2  . Years of Education: N/A   Occupational History  . 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 Smoking status: Never Smoker   . Smokeless tobacco: Former Neurosurgeon    Types: Chew  . Alcohol Use: No  . Drug Use: No  . Sexually Active: Yes   Other Topics Concern  . Not on file   Social History Narrative   HSGMarried [id number] son 44 1 daughter 23; 1 granddaughter Eileen Stanford '07), 1 grandson Pennie Rushing '09)Work: [employer] exercise: walking when he canCaffeine use: coffee all day   Current Outpatient Prescriptions on File Prior to Visit  Medication Sig Dispense Refill  . aspirin 325 MG tablet Take 1 tablet (325 mg total) by mouth daily.  300 tablet  1  . benzonatate (TESSALON PERLES) 100 MG capsule Take 1 capsule (100 mg total) by mouth 3 (three) times daily as needed for cough.  30 capsule  1  . Cyanocobalamin (VITAMIN B-12) 1000 MCG/15ML LIQD Take by mouth every 30 (thirty) days.        Marland Kitchen  fluocinonide-emollient (LIDEX-E) 0.05 % cream Apply topically 2 (two) times daily.  60 g  1  . glucose blood (ONE TOUCH ULTRA TEST) test strip Use 1 strip 3 times a day to check blood glucose as directed  100 each  12  . insulin detemir (LEVEMIR FLEXPEN) 100 UNIT/ML injection Inject 60 Units into the skin at bedtime.  15 mL  11  . Insulin Pen Needle 32G X 6 MM MISC Use as directed with Levemir Flex Pen  100 each  11  . lisinopril (PRINIVIL,ZESTRIL) 10 MG tablet Take 1 tablet (10 mg total) by mouth daily.  30 tablet  6  . metFORMIN (GLUCOPHAGE) 1000 MG tablet Take 1 tablet (1,000 mg total) by mouth 2 (two) times daily with a meal.  60 tablet  11  . ranitidine (ZANTAC) 150 MG tablet Take 150 mg  by mouth 2 (two) times daily.      . simvastatin (ZOCOR) 20 MG tablet Take 1 tablet (20 mg total) by mouth at bedtime.  90 tablet  3  . vitamin B-12 (CYANOCOBALAMIN) 1000 MCG tablet Take 1,000 mcg by mouth every 30 (thirty) days.        . [DISCONTINUED] loratadine (AF-LORATADINE) 10 MG tablet Take 1 tablet (10 mg total) by mouth daily.  30 tablet  11   A/P  [name] is a 61 yo man with DM type 2 controlled with insulin and metformin who presents for follow-up. His most recent Hgb A1C on 11/23/11 was 7.7.  # Diabetes type 2 -Sounds like diabetics clinic counseling was quite helpful in understanding which foods cause higher blood sugars, etc. -Recommended to check blood sugar less frequently than 5-6 times/day since patient is only on a basal insulin: 1) Fasting blood sugar, 3 times/wk if stable 2) After-meal blood sugar, alternating meals (i.e., after lunch one day, after dinner the next)  # Hyperlipidemia -Will check lipids today  Patient interviewed and examined; labs reviewed. Agree with assessment and plan per [name], MS III  M.Norins MD

## 2011-12-29 NOTE — Patient Instructions (Addendum)
[name] is a 61 yo man with DM type 2 controlled with insulin and metformin who presents for follow-up. His most recent Hgb A1C on 11/23/11 was 7.7.  # Diabetes type 2 -Sounds like diabetics clinic counseling was quite helpful in understanding which foods cause higher blood sugars, etc. -Recommended to check blood sugar less frequently than 5-6 times/day since patient is only on a basal insulin: 1) Fasting blood sugar, 3 times/wk if stable 2) After-meal blood sugar, alternating meals (i.e., after lunch one day, after dinner the next)  # Hyperlipidemia -Will check lipids today   Below is some information if you would like it:  Diabetes, Type 2 Diabetes is a long-lasting (chronic) disease. In type 2 diabetes, the pancreas does not make enough insulin (a hormone), and the body does not respond normally to the insulin that is made. This type of diabetes was also previously called adult-onset diabetes. It usually occurs after the age of 93, but it can occur at any age.   CAUSES   Type 2 diabetes happens because the pancreas is not making enough insulin or your body has trouble using the insulin that your pancreas does make properly. SYMPTOMS    Drinking more than usual.   Urinating more than usual.   Blurred vision.   Dry, itchy skin.   Frequent infections.   Feeling more tired than usual (fatigue).  DIAGNOSIS The diagnosis of type 2 diabetes is usually made by one of the following tests:  Fasting blood glucose test. You will not eat for at least 8 hours and then take a blood test.   Random blood glucose test. Your blood glucose (sugar) is checked at any time of the day regardless of when you ate.   Oral glucose tolerance test (OGTT). Your blood glucose is measured after you have not eaten (fasted) and then after you drink a glucose containing beverage.  TREATMENT    Healthy eating.   Exercise.   Medicine, if needed.   Monitoring blood glucose.   Seeing your caregiver  regularly.  HOME CARE INSTRUCTIONS    Check your blood glucose at least once a day. More frequent monitoring may be necessary, depending on your medicines and on how well your diabetes is controlled. Your caregiver will advise you.   Take your medicine as directed by your caregiver.   Do not smoke.   Make wise food choices. Ask your caregiver for information. Weight loss can improve your diabetes.   Learn about low blood glucose (hypoglycemia) and how to treat it.   Get your eyes checked regularly.   Have a yearly physical exam. Have your blood pressure checked and your blood and urine tested.   Wear a pendant or bracelet saying that you have diabetes.   Check your feet every night for cuts, sores, blisters, and redness. Let your caregiver know if you have any problems.  SEEK MEDICAL CARE IF:    You have problems keeping your blood glucose in target range.   You have problems with your medicines.   You have symptoms of an illness that do not improve after 24 hours.   You have a sore or wound that is not healing.   You notice a change in vision or a new problem with your vision.   You have a fever.  MAKE SURE YOU:  Understand these instructions.   Will watch your condition.   Will get help right away if you are not doing well or get worse.  Document  Released: 01/24/2005 Document Revised: 04/18/2011 Document Reviewed: 07/12/2010 [facility] Patient Information 2013 [place], [place].

## 2012-01-01 NOTE — Assessment & Plan Note (Signed)
Continued problem but he reports fair relief with loratadine and ranitidine.

## 2012-01-01 NOTE — Assessment & Plan Note (Signed)
#   Diabetes type 2 Lab Results  Component Value Date   HGBA1C 7.7* 11/25/2011    -Sounds like diabetics clinic counseling was quite helpful in understanding which foods cause higher blood sugars, etc. -Recommended to check blood sugar less frequently than 5-6 times/day since patient is only on a basal insulin: 1) Fasting blood sugar, 3 times/wk if stable 2) After-meal blood sugar, alternating meals (i.e., after lunch one day, after dinner the next)

## 2012-01-01 NOTE — Assessment & Plan Note (Signed)
LDL is much better than goal of 100 or less.  Plan Continue present medications

## 2012-01-09 ENCOUNTER — Encounter: Payer: Self-pay | Admitting: Internal Medicine

## 2012-02-17 ENCOUNTER — Other Ambulatory Visit: Payer: Self-pay | Admitting: *Deleted

## 2012-02-17 MED ORDER — INSULIN PEN NEEDLE 32G X 6 MM MISC: Status: DC

## 2012-02-17 MED ORDER — METFORMIN HCL 1000 MG PO TABS: 1000.0000 mg | ORAL_TABLET | Freq: Two times a day (BID) | ORAL | Status: DC

## 2012-02-20 ENCOUNTER — Other Ambulatory Visit: Payer: Self-pay | Admitting: *Deleted

## 2012-02-20 MED ORDER — LISINOPRIL 10 MG PO TABS: 10.0000 mg | ORAL_TABLET | Freq: Every day | ORAL | Status: DC

## 2012-03-12 ENCOUNTER — Telehealth: Payer: Self-pay | Admitting: Internal Medicine

## 2012-03-12 NOTE — Telephone Encounter (Signed)
Patient has questions about her diabeties

## 2012-03-21 ENCOUNTER — Ambulatory Visit: Payer: BC Managed Care – PPO | Admitting: Internal Medicine

## 2012-03-22 ENCOUNTER — Ambulatory Visit: Payer: BC Managed Care – PPO | Admitting: Internal Medicine

## 2012-03-26 ENCOUNTER — Encounter: Payer: Self-pay | Admitting: Internal Medicine

## 2012-03-26 ENCOUNTER — Ambulatory Visit (INDEPENDENT_AMBULATORY_CARE_PROVIDER_SITE_OTHER): Payer: BC Managed Care – PPO | Admitting: Internal Medicine

## 2012-03-26 VITALS — BP 116/68 | HR 65 | Temp 97.9°F | Resp 10 | Wt 227.8 lb

## 2012-03-26 DIAGNOSIS — M79609 Pain in unspecified limb: Secondary | ICD-10-CM

## 2012-03-26 DIAGNOSIS — E119 Type 2 diabetes mellitus without complications: Secondary | ICD-10-CM

## 2012-03-26 NOTE — Assessment & Plan Note (Signed)
Elevated CBG due to poor absorption of insulin due to repeated use of same injection spot  Plan Rotate injection spot  A1C in 1 month

## 2012-03-26 NOTE — Progress Notes (Signed)
Subjective:    Patient ID: Aaron Zimmerman, male    DOB: Oct 05, 1950, 62 y.o.   MRN: [id number]  HPI [name] presents for elevated CBGs in the 200 range. When he moved his injection site - he must have had better absorption because the CBG the following mornings has been better.   He is also having pain at the right elbow just above the joint.   Past Medical History  Diagnosis Date  . Diabetes mellitus   . Hyperlipidemia   . Personal history of urinary calculi   . Contact dermatitis and other eczema, due to unspecified cause   . Vertigo    Past Surgical History  Procedure Laterality Date  . Traumatic injury and repair of rt hand     Family History  Problem Relation Age of Onset  . Heart disease Mother     CAD/PCI with stents; cardiomyopathy with AICD  . Cancer Other     Colon and Prostate   History   Social History  . Marital Status: Married    Spouse Name: N/A    Number of Children: 2  . Years of Education: N/A   Occupational History  . 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 Smoking status: Never Smoker   . Smokeless tobacco: Former Neurosurgeon    Types: Chew  . Alcohol Use: No  . Drug Use: No  . Sexually Active: Yes   Other Topics Concern  . Not on file   Social History Narrative   HSG   Married 1979   1 son 90 1 daughter 74; 1 granddaughter Eileen Stanford '07), 1 grandson Pennie Rushing '09)   Work: [employer]   Regular exercise: walking when he can   Caffeine use: coffee all day    Current Outpatient Prescriptions on File Prior to Visit  Medication Sig Dispense Refill  . aspirin 325 MG tablet Take 1 tablet (325 mg total) by mouth daily.  300 tablet  1  . Cyanocobalamin (VITAMIN B-12) 1000 MCG/15ML LIQD Take by mouth every 30 (thirty) days.        . fluocinonide-emollient (LIDEX-E) 0.05 % cream Apply topically 2 (two) times daily.  60 g  1  . glucose blood (ONE TOUCH ULTRA TEST) test strip Use 1 strip 3 times a day to  check blood glucose as directed  100 each  12  . insulin detemir (LEVEMIR FLEXPEN) 100 UNIT/ML injection Inject 60 Units into the skin at bedtime.  15 mL  11  . Insulin Pen Needle 32G X 6 MM MISC Use as directed with Levemir Flex Pen  100 each  11  . lisinopril (PRINIVIL,ZESTRIL) 10 MG tablet Take 1 tablet (10 mg total) by mouth daily.  30 tablet  5  . loratadine (CLARITIN) 10 MG tablet Take 10 mg by mouth daily.      . metFORMIN (GLUCOPHAGE) 1000 MG tablet Take 1 tablet (1,000 mg total) by mouth 2 (two) times daily with a meal.  60 tablet  11  . ranitidine (ZANTAC) 150 MG tablet Take 150 mg by mouth 2 (two) times daily.      . simvastatin (ZOCOR) 20 MG tablet Take 1 tablet (20 mg total) by mouth at bedtime.  90 tablet  3  . vitamin B-12 (CYANOCOBALAMIN) 1000 MCG tablet Take 1,000 mcg by mouth every 30 (thirty) days.         No current facility-administered medications on file prior to visit.  Review of Systems System review is negative for any constitutional, cardiac, pulmonary, GI or neuro symptoms or complaints other than as described in the HPI.     Objective:   Physical Exam Filed Vitals:   03/26/12 1045  BP: 116/68  Pulse: 65  Temp: 97.9 F (36.6 C)  Resp: 10   Gen'l- overweight white man in no distress Cor - RRR Pulm - normal respirations Neuro - A&O x 3       Assessment & Plan:  1., Arm pain - . Pain at the back of the upper arm, above the elbow joint: this is most likely inflammation of a tendon. Plan - try using Aspercreme when it hurts. If this fails let me know so we can prescribe a topical anti-inflammatory.

## 2012-03-26 NOTE — Patient Instructions (Addendum)
1. Diabetes - the problem seems to have been with absorption of the insulin. Be sure you rotate the injection site - use the entire abdomen. Stay with the present dosing.   Plan - continue present dose of insulin  [place] back in 1 month for lab: A1C  2. Blood pressure is doing well.  3. Pain at the back of the upper arm, above the elbow joint: this is most likely inflammation of a tendon. Plan - try using Aspercreme when it hurts. If this fails let me know so we can prescribe a topical anti-inflammatory.

## 2012-05-30 ENCOUNTER — Other Ambulatory Visit: Payer: Self-pay

## 2012-05-30 MED ORDER — ASPIRIN 325 MG PO TABS: 325.0000 mg | ORAL_TABLET | Freq: Every day | ORAL | Status: DC

## 2012-07-10 ENCOUNTER — Other Ambulatory Visit (INDEPENDENT_AMBULATORY_CARE_PROVIDER_SITE_OTHER): Payer: BC Managed Care – PPO

## 2012-07-10 DIAGNOSIS — E119 Type 2 diabetes mellitus without complications: Secondary | ICD-10-CM

## 2012-07-10 LAB — HEMOGLOBIN A1C: Hgb A1c MFr Bld: 8.2 % — ABNORMAL HIGH (ref 4.6–6.5)

## 2012-07-13 ENCOUNTER — Encounter: Payer: Self-pay | Admitting: Internal Medicine

## 2012-07-19 ENCOUNTER — Ambulatory Visit (INDEPENDENT_AMBULATORY_CARE_PROVIDER_SITE_OTHER): Payer: BC Managed Care – PPO | Admitting: Internal Medicine

## 2012-07-19 ENCOUNTER — Encounter: Payer: Self-pay | Admitting: Internal Medicine

## 2012-07-19 VITALS — BP 112/68 | HR 74 | Temp 97.4°F | Ht 71.0 in | Wt 235.0 lb

## 2012-07-19 DIAGNOSIS — R918 Other nonspecific abnormal finding of lung field: Secondary | ICD-10-CM

## 2012-07-19 DIAGNOSIS — R911 Solitary pulmonary nodule: Secondary | ICD-10-CM

## 2012-07-19 DIAGNOSIS — E119 Type 2 diabetes mellitus without complications: Secondary | ICD-10-CM

## 2012-07-19 NOTE — Patient Instructions (Addendum)
Since April of '12 your A1C has been 7.7% with the last being 8.3%. Morning sugars seem to be fine most of the time.  Plan Continue present dose of levemir and metformin  Add Invokana 300 mg every AM  Check blood sugar before breakfast, then on a rotating basis check blood sugar 90 minutes after bkfst, lunch and dinner.   Referral for diabetic teaching     Pulmonary nodules - seen on previous CT chest - need to have follow up - CT chest is ordered. You will be called.

## 2012-07-22 DIAGNOSIS — R911 Solitary pulmonary nodule: Secondary | ICD-10-CM | POA: Insufficient documentation

## 2012-07-22 NOTE — Assessment & Plan Note (Signed)
Since April of '12 your A1C has been 7.7% with the last being 8.3%. Morning sugars seem to be fine most of the time.  Plan Continue present dose of levemir and metformin  Add Invokana 300 mg every AM  Check blood sugar before breakfast, then on a rotating basis check blood sugar 90 minutes after bkfst, lunch and dinner. Report back via MyChart  Referral for diabetic teaching

## 2012-07-22 NOTE — Progress Notes (Signed)
Subjective:    Patient ID: Aaron Zimmerman, male    DOB: 1950/02/24, 62 y.o.   MRN: [id number]  HPI [name] presents to review diabetes management. His last A1C was 8.3% up from 7.7% on a regimen of levemir and metformin. He does try to follow a no sugar, low carb diet. His work is active but he doesn't have a regular exercise program. He has had diabetes education in the past but he does ask for a referral to the diabetic educator working at [employer].  Past Medical History  Diagnosis Date  . Diabetes mellitus   . Hyperlipidemia   . Personal history of urinary calculi   . Contact dermatitis and other eczema, due to unspecified cause   . Vertigo    Past Surgical History  Procedure Laterality Date  . Traumatic injury and repair of rt hand     Family History  Problem Relation Age of Onset  . Heart disease Mother     CAD/PCI with stents; cardiomyopathy with AICD  . Cancer Other     Colon and Prostate   History   Social History  . Marital Status: Married    Spouse Name: N/A    Number of Children: 2  . Years of Education: N/A   Occupational History  . 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 Smoking status: Never Smoker   . Smokeless tobacco: Former Neurosurgeon    Types: Chew  . Alcohol Use: No  . Drug Use: No  . Sexually Active: Yes   Other Topics Concern  . Not on file   Social History Narrative   HSG   Married 1979   1 son 10 1 daughter 21; 1 granddaughter Eileen Stanford '07), 1 grandson Pennie Rushing '09)   Work: [employer]   Regular exercise: walking when he can   Caffeine use: coffee all day    Current Outpatient Prescriptions on File Prior to Visit  Medication Sig Dispense Refill  . aspirin 325 MG tablet Take 1 tablet (325 mg total) by mouth daily.  300 tablet  1  . Cyanocobalamin (VITAMIN B-12) 1000 MCG/15ML LIQD Take by mouth every 30 (thirty) days.        Marland Kitchen glucose blood (ONE TOUCH ULTRA TEST) test strip Use 1  strip 3 times a day to check blood glucose as directed  100 each  12  . insulin detemir (LEVEMIR FLEXPEN) 100 UNIT/ML injection Inject 60 Units into the skin at bedtime.  15 mL  11  . Insulin Pen Needle 32G X 6 MM MISC Use as directed with Levemir Flex Pen  100 each  11  . lisinopril (PRINIVIL,ZESTRIL) 10 MG tablet Take 1 tablet (10 mg total) by mouth daily.  30 tablet  5  . loratadine (CLARITIN) 10 MG tablet Take 10 mg by mouth daily.      . metFORMIN (GLUCOPHAGE) 1000 MG tablet Take 1 tablet (1,000 mg total) by mouth 2 (two) times daily with a meal.  60 tablet  11  . ranitidine (ZANTAC) 150 MG tablet Take 150 mg by mouth 2 (two) times daily.      . simvastatin (ZOCOR) 20 MG tablet Take 1 tablet (20 mg total) by mouth at bedtime.  90 tablet  3  . vitamin B-12 (CYANOCOBALAMIN) 1000 MCG tablet Take 1,000 mcg by mouth every 30 (thirty) days.         No current facility-administered medications on file prior to visit.  Review of Systems System review is negative for any constitutional, cardiac, pulmonary, GI or neuro symptoms or complaints other than as described in the HPI.     Objective:   Physical Exam Filed Vitals:   07/19/12 1450  BP: 112/68  Pulse: 74  Temp: 97.4 F (36.3 C)   Wt Readings from Last 3 Encounters:  07/19/12 235 lb (106.595 kg)  03/26/12 227 lb 12.8 oz (103.329 kg)  12/29/11 230 lb (104.327 kg)   Gen'l - overweight white man in no distress HEENT- C&S clear, PERRLA, Cor 2+ radial, regular pulse Pulm - normal respirations Neuro - A&O x 3, normal gait and balance.       Assessment & Plan:

## 2012-07-22 NOTE — Assessment & Plan Note (Signed)
Reviewed CT done as part of work-up for night sweats and weakness. He has been doing better w/o any signs or symptoms of pulmonary disease and night sweats have resolved  Plan F/y CT chest w/o contrast.

## 2012-07-23 ENCOUNTER — Ambulatory Visit (INDEPENDENT_AMBULATORY_CARE_PROVIDER_SITE_OTHER)
Admission: RE | Admit: 2012-07-23 | Discharge: 2012-07-23 | Disposition: A | Discharge status: Home / Self Care | Payer: BC Managed Care – PPO | Source: Ambulatory Visit | Attending: Internal Medicine | Admitting: Internal Medicine

## 2012-07-23 ENCOUNTER — Encounter: Payer: Self-pay | Admitting: Internal Medicine

## 2012-07-23 DIAGNOSIS — R918 Other nonspecific abnormal finding of lung field: Secondary | ICD-10-CM

## 2012-07-27 ENCOUNTER — Encounter: Payer: Self-pay | Admitting: Internal Medicine

## 2012-07-27 ENCOUNTER — Other Ambulatory Visit: Payer: Self-pay

## 2012-07-27 MED ORDER — CANAGLIFLOZIN 300 MG PO TABS: 300.0000 mg | ORAL_TABLET | Freq: Every morning | ORAL | Status: DC

## 2012-07-27 NOTE — Telephone Encounter (Signed)
[place] with [facility] pharmacy called stating patient thought he had a prescription for Invokanna 300 mg. He was receiving samples. Please advise.

## 2012-08-27 ENCOUNTER — Other Ambulatory Visit: Payer: Self-pay

## 2012-08-27 MED ORDER — LISINOPRIL 10 MG PO TABS: 10.0000 mg | ORAL_TABLET | Freq: Every day | ORAL | Status: DC

## 2012-09-06 ENCOUNTER — Other Ambulatory Visit: Payer: Self-pay

## 2012-09-06 MED ORDER — SIMVASTATIN 20 MG PO TABS: 20.0000 mg | ORAL_TABLET | Freq: Every day | ORAL | Status: DC

## 2012-11-21 ENCOUNTER — Other Ambulatory Visit: Payer: Self-pay

## 2012-11-21 MED ORDER — INSULIN DETEMIR 100 UNIT/ML ~~LOC~~ SOLN: 60.0000 [IU] | Freq: Every day | SUBCUTANEOUS | Status: DC

## 2012-12-13 ENCOUNTER — Other Ambulatory Visit: Payer: Self-pay

## 2013-01-23 ENCOUNTER — Other Ambulatory Visit: Payer: BC Managed Care – PPO

## 2013-01-23 ENCOUNTER — Telehealth: Payer: Self-pay

## 2013-01-23 DIAGNOSIS — E119 Type 2 diabetes mellitus without complications: Secondary | ICD-10-CM

## 2013-01-23 NOTE — Telephone Encounter (Signed)
FYI   Patient went down to lab stating he get lab work every 4 months. I looked through his chart and the only order I could see to place was an A1C.

## 2013-01-23 NOTE — Telephone Encounter (Signed)
Per [place] in the lab patient left and did not come back. In case he does come back order for Bmet has been placed also.

## 2013-01-23 NOTE — Telephone Encounter (Signed)
Primarily monitoring A1C. Now on invokana. OK for A1C and Bmet.

## 2013-02-18 ENCOUNTER — Other Ambulatory Visit: Payer: Self-pay | Admitting: Internal Medicine

## 2013-02-20 ENCOUNTER — Encounter: Payer: Self-pay | Admitting: Internal Medicine

## 2013-02-28 ENCOUNTER — Other Ambulatory Visit (INDEPENDENT_AMBULATORY_CARE_PROVIDER_SITE_OTHER): Payer: BC Managed Care – PPO

## 2013-02-28 DIAGNOSIS — E119 Type 2 diabetes mellitus without complications: Secondary | ICD-10-CM

## 2013-02-28 LAB — COMPREHENSIVE METABOLIC PANEL
ALK PHOS: 48 U/L (ref 39–117)
ALT: 27 U/L (ref 0–53)
AST: 22 U/L (ref 0–37)
Albumin: 4.4 g/dL (ref 3.5–5.2)
BILIRUBIN TOTAL: 0.7 mg/dL (ref 0.3–1.2)
BUN: 16 mg/dL (ref 6–23)
CO2: 28 meq/L (ref 19–32)
CREATININE: 1.1 mg/dL (ref 0.4–1.5)
Calcium: 9.7 mg/dL (ref 8.4–10.5)
Chloride: 103 mEq/L (ref 96–112)
GFR: 74.85 mL/min (ref >60.00)
Glucose, Bld: 137 mg/dL — ABNORMAL HIGH (ref 70–99)
Potassium: 5 mEq/L (ref 3.5–5.1)
SODIUM: 139 meq/L (ref 135–145)
TOTAL PROTEIN: 7.7 g/dL (ref 6.0–8.3)

## 2013-02-28 LAB — HEMOGLOBIN A1C: HEMOGLOBIN A1C: 7.4 % — AB (ref 4.6–6.5)

## 2013-03-06 ENCOUNTER — Ambulatory Visit (INDEPENDENT_AMBULATORY_CARE_PROVIDER_SITE_OTHER): Payer: BC Managed Care – PPO | Admitting: Internal Medicine

## 2013-03-06 ENCOUNTER — Ambulatory Visit (INDEPENDENT_AMBULATORY_CARE_PROVIDER_SITE_OTHER): Payer: BC Managed Care – PPO

## 2013-03-06 ENCOUNTER — Encounter: Payer: Self-pay | Admitting: Internal Medicine

## 2013-03-06 VITALS — BP 124/60 | HR 95 | Temp 97.6°F | Wt 228.8 lb

## 2013-03-06 DIAGNOSIS — D126 Benign neoplasm of colon, unspecified: Secondary | ICD-10-CM

## 2013-03-06 DIAGNOSIS — E119 Type 2 diabetes mellitus without complications: Secondary | ICD-10-CM

## 2013-03-06 DIAGNOSIS — Z23 Encounter for immunization: Secondary | ICD-10-CM

## 2013-03-06 DIAGNOSIS — L255 Unspecified contact dermatitis due to plants, except food: Secondary | ICD-10-CM

## 2013-03-06 DIAGNOSIS — R6882 Decreased libido: Secondary | ICD-10-CM

## 2013-03-06 DIAGNOSIS — F528 Other sexual dysfunction not due to a substance or known physiological condition: Secondary | ICD-10-CM

## 2013-03-06 DIAGNOSIS — E785 Hyperlipidemia, unspecified: Secondary | ICD-10-CM

## 2013-03-06 DIAGNOSIS — Z125 Encounter for screening for malignant neoplasm of prostate: Secondary | ICD-10-CM

## 2013-03-06 DIAGNOSIS — E538 Deficiency of other specified B group vitamins: Secondary | ICD-10-CM

## 2013-03-06 DIAGNOSIS — Z0000 Encounter for general adult medical examination without abnormal findings: Secondary | ICD-10-CM

## 2013-03-06 LAB — VITAMIN B12: Vitamin B-12: 188 pg/mL — ABNORMAL LOW (ref 211–911)

## 2013-03-06 LAB — LIPID PANEL
CHOLESTEROL: 133 mg/dL (ref 0–200)
HDL: 33 mg/dL — ABNORMAL LOW (ref >39.00)
TRIGLYCERIDES: 238 mg/dL — AB (ref 0.0–149.0)
Total CHOL/HDL Ratio: 4
VLDL: 47.6 mg/dL — ABNORMAL HIGH (ref 0.0–40.0)

## 2013-03-06 LAB — LDL CHOLESTEROL, DIRECT: Direct LDL: 84.3 mg/dL

## 2013-03-06 NOTE — Patient Instructions (Signed)
Thanks for coming to see me and allowing me to be your physician all these years.  Diabetes - A1C 7.4% - much better control. Please continue to take all your present medications. If you have too many low blood sugar readings, less than 75, let us know  Cholesterol  - will check lab today.  Colon cancer screening - order placed for colonoscopy April of this year.  Prostate cancer screening - will check a PSA today.  Decreased sex drive  Will check the testosterone level today.  Immunizations Flu shot today; Prevnar pneumonia vaccine today, shingles vaccine in 4 weeks, Pneumovax pneumonia vaccine in 8 weeks.  As I retire you will be reassigned to a new doctor - Dr. Damita Dunnings at [facility] office.

## 2013-03-06 NOTE — Progress Notes (Signed)
Subjective:    Patient ID: ISAO SELTZER, male    DOB: 01-03-1951, 63 y.o.   MRN: [id number]  HPI Mr. Joneen Caraway presents for a general exam as well as follow up of diabetes. He has been doing well but reports that his CBGs have been variable. He went through a period of time when his CBGs were quite high but he reports no change in his medical regimen or his diet. The CBGs have returned to his baseline over the past several weeks. He has been generally doing well with no acute complaints. He is current with dental and eye care. He follows a healthy diet. He does not have a regular exercise program other than walking. He continues to work for [employer]. His marriage is doing well. He does report decreased libido which he attributes to his wife's chronic pain.  Past Medical History  Diagnosis Date  . Diabetes mellitus   . Hyperlipidemia   . Personal history of urinary calculi   . Contact dermatitis and other eczema, due to unspecified cause   . Vertigo    Past Surgical History  Procedure Laterality Date  . Traumatic injury and repair of rt hand     Family History  Problem Relation Age of Onset  . Heart disease Mother     CAD/PCI with stents; cardiomyopathy with AICD  . Cancer Other     Colon and Prostate   History   Social History  . Marital Status: Married    Spouse Name: N/A    Number of Children: 2  . Years of Education: N/A   Occupational History  . 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 Smoking status: Never Smoker   . Smokeless tobacco: Former Systems developer    Types: Chew  . Alcohol Use: No  . Drug Use: No  . Sexual Activity: Yes   Other Topics Concern  . Not on file   Social History Narrative   HSG   Married 1979   1 son 43 1 daughter 78; 1 granddaughter Eliezer Lofts '07), 1 grandson (Seth '09)   Work: [employer]   Regular exercise: walking when he can   Caffeine use: coffee all day     Current  Outpatient Prescriptions on File Prior to Visit  Medication Sig Dispense Refill  . aspirin 325 MG tablet Take 1 tablet (325 mg total) by mouth daily.  300 tablet  1  . Canagliflozin (INVOKANA) 300 MG TABS Take 300 mg by mouth every morning.  30 tablet  11  . Cyanocobalamin (VITAMIN B-12) 1000 MCG/15ML LIQD Take by mouth every 30 (thirty) days.        Marland Kitchen glucose blood (ONE TOUCH ULTRA TEST) test strip Use 1 strip 3 times a day to check blood glucose as directed  100 each  12  . insulin detemir (LEVEMIR) 100 UNIT/ML injection Inject 0.6 mLs (60 Units total) into the skin at bedtime.  15 mL  11  . Insulin Pen Needle 32G X 6 MM MISC Use as directed with Levemir Flex Pen  100 each  11  . lisinopril (PRINIVIL,ZESTRIL) 10 MG tablet Take 1 tablet (10 mg total) by mouth daily.  30 tablet  5  . loratadine (CLARITIN) 10 MG tablet Take 10 mg by mouth daily.      . metFORMIN (GLUCOPHAGE) 1000 MG tablet TAKE ONE (1) TABLET BY MOUTH TWO (2) TIMES DAILY WITH A MEAL  60 tablet  5  .  ranitidine (ZANTAC) 150 MG tablet Take 150 mg by mouth 2 (two) times daily.      . simvastatin (ZOCOR) 20 MG tablet Take 1 tablet (20 mg total) by mouth at bedtime.  90 tablet  3  . vitamin B-12 (CYANOCOBALAMIN) 1000 MCG tablet Take 1,000 mcg by mouth every 30 (thirty) days.         No current facility-administered medications on file prior to visit.      Review of Systems Constitutional:  Negative for fever, chills, activity change and unexpected weight change.  HEENT:  Negative for hearing loss, ear pain, congestion, neck stiffness and postnasal drip. Negative for sore throat or swallowing problems. Negative for dental complaints.   Eyes: Negative for vision loss or change in visual acuity.  Respiratory: Negative for chest tightness and wheezing. Negative for DOE.   Cardiovascular: Negative for chest pain or palpitations. No decreased exercise tolerance Gastrointestinal: No change in bowel habit. No bloating or gas. No reflux  or indigestion Genitourinary: Negative for urgency, frequency, flank pain and difficulty urinating.  Musculoskeletal: Negative for myalgias, back pain, arthralgias and gait problem.  Neurological: Negative for dizziness, tremors, weakness and headaches.  Hematological: Negative for adenopathy.  Psychiatric/Behavioral: Negative for behavioral problems and dysphoric mood.       Objective:   Physical Exam Filed Vitals:   03/06/13 1304  BP: 124/60  Pulse: 95  Temp: 97.6 F (36.4 C)   Wt Readings from Last 3 Encounters:  03/06/13 228 lb 12.8 oz (103.783 kg)  07/19/12 235 lb (106.595 kg)  03/26/12 227 lb 12.8 oz (103.329 kg)   Gen'l: Well nourished well developed male in no acute distress  HEENT: Head: Normocephalic and atraumatic. Right Ear: External ear normal. EAC/TM nl. Left Ear: External ear normal.  EAC/TM nl. Nose: Nose normal. Mouth/Throat: Oropharynx is clear and moist. Dentition - poor repair, several broken teeth.. No buccal or palatal lesions. Posterior pharynx clear. Eyes: Conjunctivae and sclera clear. EOM intact. Pupils are equal, round, and reactive to light. Right eye exhibits no discharge. Left eye exhibits no discharge. Neck: Normal range of motion. Neck supple. No JVD present. No tracheal deviation present. No thyromegaly present.  Cardiovascular: Normal rate, regular rhythm, no gallop, no friction rub, no murmur heard.      Quiet precordium. 2+ radial and DP pulses . No carotid bruits Pulmonary/Chest: Effort normal. No respiratory distress or increased WOB, no wheezes, no rales. No chest wall deformity or CVAT. Abdomen: Soft. Bowel sounds are normal in all quadrants. He exhibits no distension, no tenderness, no rebound or guarding, No heptosplenomegaly  Genitourinary: deferred  Musculoskeletal: Normal range of motion. He exhibits no edema and no tenderness.       Small and large joints without redness, synovial thickening or deformity. Full range of motion preserved  about all small, median and large joints.  Lymphadenopathy:    He has no cervical or supraclavicular adenopathy.  Neurological: He is alert and oriented to person, place, and time. CN II-XII intact. DTRs 2+ and symmetrical biceps, radial and patellar tendons. Cerebellar function normal with no tremor, rigidity, normal gait and station.  Skin: Skin is warm and dry. No rash noted. No erythema.  Psychiatric: He has a normal mood and affect. His behavior is normal. Thought content normal.   Recent Results (from the past 2160 hour(s))  HEMOGLOBIN A1C     Status: Abnormal   Collection Time    02/28/13  8:40 AM      Result Value Range  Hemoglobin A1C 7.4 (*) 4.6 - 6.5 %   Comment: Glycemic Control Guidelines for People with Diabetes:Non Diabetic:  <6%Goal of Therapy: <7%Additional Action Suggested:  >8%   COMPREHENSIVE METABOLIC PANEL     Status: Abnormal   Collection Time    02/28/13  8:40 AM      Result Value Range   Sodium 139  135 - 145 mEq/L   Potassium 5.0  3.5 - 5.1 mEq/L   Chloride 103  96 - 112 mEq/L   CO2 28  19 - 32 mEq/L   Glucose, Bld 137 (*) 70 - 99 mg/dL   BUN 16  6 - 23 mg/dL   Creatinine, Ser 1.1  0.4 - 1.5 mg/dL   Total Bilirubin 0.7  0.3 - 1.2 mg/dL   Alkaline Phosphatase 48  39 - 117 U/L   AST 22  0 - 37 U/L   ALT 27  0 - 53 U/L   Total Protein 7.7  6.0 - 8.3 g/dL   Albumin 4.4  3.5 - 5.2 g/dL   Calcium 9.7  8.4 - 27.2 mg/dL   GFR 53.66  >44.03 mL/min  TESTOSTERONE     Status: None   Collection Time    03/06/13  2:01 PM      Result Value Range   Testosterone 368.28  350.00 - 890.00 ng/dL  LIPID PANEL     Status: Abnormal   Collection Time    03/06/13  2:01 PM      Result Value Range   Cholesterol 133  0 - 200 mg/dL   Comment: ATP III Classification       Desirable:  < 200 mg/dL               Borderline High:  200 - 239 mg/dL          High:  > = 474 mg/dL   Triglycerides 259.5 (*) 0.0 - 149.0 mg/dL   Comment: Normal:  <638 mg/dLBorderline High:  150 - 199  mg/dL   HDL 75.64 (*) >33.29 mg/dL   VLDL 51.8 (*) 0.0 - 84.1 mg/dL   Total CHOL/HDL Ratio 4     Comment:                Men          Women1/2 Average Risk     3.4          3.3Average Risk          5.0          4.42X Average Risk          9.6          7.13X Average Risk          15.0          11.0                      VITAMIN B12     Status: Abnormal   Collection Time    03/06/13  2:01 PM      Result Value Range   Vitamin B-12 188 (*) 211 - 911 pg/mL  LDL CHOLESTEROL, DIRECT     Status: None   Collection Time    03/06/13  2:01 PM      Result Value Range   Direct LDL 84.3     Comment: Optimal:  <100 mg/dLNear or Above Optimal:  100-129 mg/dLBorderline High:  130-159 mg/dLHigh:  160-189 mg/dLVery High:  >190 mg/dL  Assessment & Plan:

## 2013-03-06 NOTE — Progress Notes (Signed)
Pre visit review using our clinic review tool, if applicable. No additional management support is needed unless otherwise documented below in the visit note. 

## 2013-03-08 LAB — TESTOSTERONE: Testosterone: 368.28 ng/dL (ref 350.00–890.00)

## 2013-03-09 DIAGNOSIS — Z0000 Encounter for general adult medical examination without abnormal findings: Secondary | ICD-10-CM | POA: Insufficient documentation

## 2013-03-09 NOTE — Assessment & Plan Note (Signed)
Interval history notable for variable CBGs but otherwise negative for any major illness, surgery or injury. Physical exam is unremarkable. Lab results reveal good control of cholesterol and diabetes. He is current but due for colorectal cancer screening. Discussed pros and cons of prostate cancer screening (USPHCTF recommendations reviewed and ACU April '13 recommendations) and he defers evaluation at this time. Immunizations are brought up to date with flu shot and Prevnar. He will return for Pneumovax and following that shingles vaccine.   In summary A nice man who appears to be medically stable at this time. He will continue his medical regimen and will resume B12 replacement. He will return in 6 months for routine follow up.

## 2013-03-09 NOTE — Assessment & Plan Note (Signed)
H/o adematous polyp - due for follow up colonoscopy April '15-request for follow up appointment entered.

## 2013-03-09 NOTE — Assessment & Plan Note (Signed)
Lab Results  Component Value Date   HGBA1C 7.4* 02/28/2013   Close to goal on current regimen of basal insulin metform and SGLT2 w/o any hypoglycemia. He is current with eye care.  Plan Continue present regimen

## 2013-03-09 NOTE — Assessment & Plan Note (Signed)
H/o low B12. Plan    Follow up up.  Addendum: B12 188 - low Plan Resume B12 replacement, either 1,000 mcg IM monthly or 1,000 mcg daily

## 2013-03-09 NOTE — Assessment & Plan Note (Signed)
Patient with decreased libido which he attributes to extrinsic factors. Testosterone level is normal.

## 2013-03-09 NOTE — Assessment & Plan Note (Signed)
Pruritis and rash now quiescent.

## 2013-03-09 NOTE — Assessment & Plan Note (Signed)
Taking and tolerating "Statin" therapy w/o adverse effects.  Lab reveals LDL better than goal of 100 or less, HDL low, LFTs normal.  Plan  continue present regimen.

## 2013-03-11 ENCOUNTER — Encounter: Payer: Self-pay | Admitting: Internal Medicine

## 2013-03-11 ENCOUNTER — Other Ambulatory Visit: Payer: Self-pay | Admitting: Internal Medicine

## 2013-03-11 ENCOUNTER — Ambulatory Visit (INDEPENDENT_AMBULATORY_CARE_PROVIDER_SITE_OTHER): Payer: BC Managed Care – PPO | Admitting: Internal Medicine

## 2013-03-11 VITALS — BP 118/68 | HR 92 | Temp 100.0°F | Resp 26 | Wt 224.0 lb

## 2013-03-11 DIAGNOSIS — R05 Cough: Secondary | ICD-10-CM

## 2013-03-11 DIAGNOSIS — J111 Influenza due to unidentified influenza virus with other respiratory manifestations: Secondary | ICD-10-CM

## 2013-03-11 DIAGNOSIS — R059 Cough, unspecified: Secondary | ICD-10-CM

## 2013-03-11 MED ORDER — OSELTAMIVIR PHOSPHATE 75 MG PO CAPS: 75.0000 mg | ORAL_CAPSULE | Freq: Two times a day (BID) | ORAL | Status: DC

## 2013-03-11 MED ORDER — HYDROCODONE-HOMATROPINE 5-1.5 MG/5ML PO SYRP: 5.0000 mL | ORAL_SOLUTION | Freq: Four times a day (QID) | ORAL | Status: DC | PRN

## 2013-03-11 NOTE — Progress Notes (Signed)
Pre visit review using our clinic review tool, if applicable. No additional management support is needed unless otherwise documented below in the visit note. 

## 2013-03-11 NOTE — Patient Instructions (Signed)
NSAIDS ( Aleve, Advil, Naproxen) or Tylenol every 4 hrs as needed for fever as discussed based on label recommendations.hydra Stay well hydrated. Drink to thirst up to 40 ounces of fluids daily.

## 2013-03-11 NOTE — Progress Notes (Signed)
   Subjective:    Patient ID: Aaron Zimmerman, male    DOB: April 21, 1950, 63 y.o.   MRN: [id number]  HPI: Body aches, fatigue, fever (100-101) beginning yesterday am 8-9am. Has headache, slightly runny nose, and cough (mostly non-productive). Cough worse at night. Denies shortness of breath.   Denies ear pain. Eyes slightly itchy without discharge. Sore throat starting today. Was around uncle who was sick January 27. Had flu shot around Jan 30.  Has taken tylenol without relief.    Review of Systems  He does not have significant frontal sinus or facial sinus pain. He has no significant nasal purulence. FBS usually < 115,rarely > 200     Objective:   Physical Exam General appearance:Appears tired ;well nourished; no acute distress or increased work of breathing is present.  No  lymphadenopathy about the head, neck, or axilla noted.   Eyes: No conjunctival inflammation or lid edema is present. There is no scleral icterus.  Ears:  External ear exam shows no significant lesions or deformities.  Otoscopic examination reveals clear canals, tympanic membranes are intact bilaterally without bulging, retraction, inflammation or discharge.  Nose:  External nasal examination shows no deformity or inflammation. Nasal mucosa are dry without lesions or exudates. No septal dislocation or deviation.No obstruction to airflow.   Oral exam: Dental hygiene is good; lips and gums are healthy appearing.There is no oropharyngeal erythema or exudate noted.   Neck:  No deformities,  masses, or tenderness noted.   Supple with full range of motion without pain.   Heart:  Normal rate and regular rhythm. S1 and S2 normal without gallop, murmur, click, rub or other extra sounds.   Lungs:Chest clear to auscultation; no wheezes, rhonchi,rales ,or rubs present.No increased work of breathing.  Repeat R rate 16  Extremities:  No cyanosis, edema, or clubbing  noted    Skin: Warm & dry w/o jaundice or tenting.         Assessment & Plan:  #29flu syndrome See orders

## 2013-03-12 ENCOUNTER — Telehealth: Payer: Self-pay | Admitting: Internal Medicine

## 2013-03-12 MED ORDER — ONDANSETRON HCL 4 MG PO TABS: ORAL_TABLET | ORAL | Status: DC

## 2013-03-12 NOTE — Telephone Encounter (Addendum)
Spoke with the pt and informed him of Dr. Clayborn Heron recommendation below.  Pt stated that he's doing everything that was recommended below ,but he feels that maybe the Tamiflu is what's making him to have N/V.  He stated that he has not been able to kept it down after taking it.  Pt stated that he will get the Zofran and see if that will help.  Informed the pt that I will check on him in the morning to see how he is doing.  Rx sent to the pharmacy by e-script.//AB/CMA

## 2013-03-12 NOTE — Telephone Encounter (Signed)
Please advise.//AB/CMA 

## 2013-03-12 NOTE — Telephone Encounter (Signed)
Pt was in the office for flu symptom yesterday 2.2.15. Dr. Linna Darner prescribed Tamiflu for pt to take. Pt is not doing better, he throw up every time he takes this med and unable to eat. Pt is not sure what to do at this point. Please call pt.

## 2013-03-12 NOTE — Addendum Note (Signed)
Addended by: Harl Bowie on: 03/12/2013 05:16 PM   Modules accepted: Orders

## 2013-03-12 NOTE — Telephone Encounter (Signed)
Zofran 4 mg every 4-6 hrs prn N&V.#6 Stay on clear liquids for 48-72 hours .This would include  jello, sherbert (NOT ice cream), Lipton's chicken noodle soup(NOT cream based soups),Gatorade Lite, flat Ginger ale (without High Fructose Corn Syrup),dry toast or crackers, baked potato.No milk , dairy or grease . Labs in am if febrile or N&V persist

## 2013-03-13 NOTE — Telephone Encounter (Signed)
Spoke with the pt and he stated the after taking the Zofran he was able to kept the Tamiflu and the cough med down.  He feels the N/V was coming from either the Tamiflu or the cough med.  Pt said he is feeling better and he feels the fever is decreasing and he's tied.   Informed the pt if he starts to feel bad please give [place] a call.  Pt agreed.//AB/CMA

## 2013-03-14 ENCOUNTER — Encounter: Payer: Self-pay | Admitting: Internal Medicine

## 2013-04-17 ENCOUNTER — Telehealth: Payer: Self-pay | Admitting: *Deleted

## 2013-04-17 DIAGNOSIS — Z125 Encounter for screening for malignant neoplasm of prostate: Secondary | ICD-10-CM

## 2013-04-17 NOTE — Telephone Encounter (Signed)
Spouse phoned inquiring about PSA results---none were drawn.  Do you wish to order it again? Also, patient's testosterone levels were on low side of WNL.  Any further advice or recommendations to increasing testosterone (ie. Increasing his sex drive).  Please advise.  CB# [phone]

## 2013-04-18 ENCOUNTER — Telehealth: Payer: Self-pay | Admitting: Internal Medicine

## 2013-04-18 DIAGNOSIS — R6882 Decreased libido: Secondary | ICD-10-CM

## 2013-04-18 NOTE — Telephone Encounter (Signed)
Reviewed last physical exam - discussed in detail prostate screening and he deferred testing. All lab results were sent via [name] 03/10/13. Testosterone level was normal - no replacement for normal testosterone levels.

## 2013-04-18 NOTE — Telephone Encounter (Signed)
PSA ordered- may come by any time.  There is no specific thing to do to raise the testosterone. If it is in normal range, even low normal, it is inappropriate to treat with exogenous testosterone. He may get a second opinion on this from a urologist if he is so inclined.

## 2013-04-18 NOTE — Telephone Encounter (Signed)
Spoke with pts wife, advised of MDs message.  However, she says pt declined the manual Prostate Exam, wanting the PSA lab drawn.  Also requests what can be done to boost the Testosterone which is on the low end of normal.  Please advise

## 2013-04-18 NOTE — Addendum Note (Signed)
Addended by: Neena Rhymes on: 04/18/2013 04:16 PM   Modules accepted: Orders

## 2013-04-18 NOTE — Telephone Encounter (Signed)
PCC notified.  

## 2013-04-18 NOTE — Telephone Encounter (Signed)
Pt's wife called back to say he does want to see a urologist per previous message.

## 2013-04-18 NOTE — Telephone Encounter (Signed)
Spoke with pts wife advised of MDs message. 

## 2013-04-19 NOTE — Telephone Encounter (Signed)
Wife is aware

## 2013-04-25 ENCOUNTER — Other Ambulatory Visit (INDEPENDENT_AMBULATORY_CARE_PROVIDER_SITE_OTHER): Payer: BC Managed Care – PPO

## 2013-04-25 DIAGNOSIS — Z125 Encounter for screening for malignant neoplasm of prostate: Secondary | ICD-10-CM

## 2013-04-25 LAB — PSA: PSA: 2.37 ng/mL (ref 0.10–4.00)

## 2013-05-03 ENCOUNTER — Ambulatory Visit (AMBULATORY_SURGERY_CENTER): Payer: Self-pay | Admitting: *Deleted

## 2013-05-03 VITALS — Ht 70.0 in | Wt 228.0 lb

## 2013-05-03 DIAGNOSIS — Z86010 Personal history of colonic polyps: Secondary | ICD-10-CM

## 2013-05-03 MED ORDER — MOVIPREP 100 G PO SOLR: ORAL | Status: DC

## 2013-05-03 NOTE — Progress Notes (Signed)
No allergies to eggs or soy. No problems with anesthesia.  Pt given Emmi instructions for colonoscopy  

## 2013-05-07 ENCOUNTER — Encounter: Payer: Self-pay | Admitting: Internal Medicine

## 2013-05-09 NOTE — Telephone Encounter (Signed)
This encounter was created in error - please disregard.

## 2013-05-17 ENCOUNTER — Encounter: Payer: Self-pay | Admitting: Internal Medicine

## 2013-05-17 ENCOUNTER — Ambulatory Visit (AMBULATORY_SURGERY_CENTER): Payer: BC Managed Care – PPO | Admitting: Internal Medicine

## 2013-05-17 VITALS — BP 107/69 | HR 76 | Temp 98.3°F | Resp 16 | Ht 70.0 in | Wt 228.0 lb

## 2013-05-17 DIAGNOSIS — Z86010 Personal history of colonic polyps: Secondary | ICD-10-CM

## 2013-05-17 DIAGNOSIS — D126 Benign neoplasm of colon, unspecified: Secondary | ICD-10-CM

## 2013-05-17 MED ORDER — SODIUM CHLORIDE 0.9 % IV SOLN: 500.0000 mL | INTRAVENOUS | Status: DC

## 2013-05-17 NOTE — Op Note (Signed)
[place]  [address] [place], [id number]   COLONOSCOPY PROCEDURE REPORT  PATIENT: Ralf, Konopka  MR#: [id number] BIRTHDATE: Aug 22, 1950 , 62  yrs. old GENDER: Male ENDOSCOPIST: Lafayette Dragon, MD REFERRED VF:IEPPIRJ Marquis Lunch, M.D. PROCEDURE DATE:  05/17/2013 PROCEDURE:   Colonoscopy with cold biopsy polypectomy First Screening Colonoscopy - Avg.  risk and is 50 yrs.  old or older - No.  Prior Negative Screening - Now for repeat screening. N/A  History of Adenoma - Now for follow-up colonoscopy & has been > or = to 3 yrs.  Yes hx of adenoma.  Has been 3 or more years since last colonoscopy.  Polyps Removed Today? Yes. ASA CLASS:   Class II INDICATIONS:prior colonoscopy in February 2007 with removal of 3 polyps.  Last colonoscopy April 2000 and showed tubulovillous adenoma at 25 cm. MEDICATIONS: MAC sedation, administered by CRNA and Propofol (Diprivan) 240 mg IV  DESCRIPTION OF PROCEDURE:   After the risks benefits and alternatives of the procedure were thoroughly explained, informed consent was obtained.  A digital rectal exam revealed no abnormalities of the rectum.   The LB [id number]  endoscope was introduced through the anus and advanced to the cecum, which was identified by both the appendix and ileocecal valve. No adverse events experienced.   The quality of the prep was good, using MoviPrep  The instrument was then slowly withdrawn as the colon was fully examined.      COLON FINDINGS: Two diminutive sessile polyps were found at the cecum and in the sigmoid colon.  A polypectomy was performed with cold forceps.  The resection was complete and the polyp tissue was completely retrieved.   Mild diverticulosis was noted in the sigmoid colon.   Small internal hemorrhoids were found. Retroflexed views revealed no abnormalities. The time to cecum=6 minutes 02 seconds.  Withdrawal time=11 minutes 21 seconds.  The scope was withdrawn and the  procedure completed. COMPLICATIONS: There were no complications.  ENDOSCOPIC IMPRESSION: 1.   Two diminutive sessile polyps were found at the cecum and in the sigmoid colon at 20 cm; polypectomy was performed with cold forceps 2.   Mild diverticulosis was noted in the sigmoid colon 3.   Small internal hemorrhoids  RECOMMENDATIONS: 1.  Await pathology results 2.  high-fiber diet Recall colonoscopy pending path report   eSigned:  Lafayette Dragon, MD 05/17/2013 10:44 AM   cc:   PATIENT NAME:  Wanya, Bangura MR#: [id number]

## 2013-05-17 NOTE — Progress Notes (Signed)
Called to room to assist during endoscopic procedure.  Patient ID and intended procedure confirmed with present staff. Received instructions for my participation in the procedure from the performing physician.  

## 2013-05-17 NOTE — Patient Instructions (Signed)
YOU HAD AN ENDOSCOPIC PROCEDURE TODAY AT THE [facility]: Refer to the procedure report that was given to you for any specific questions about what was found during the examination.  If the procedure report does not answer your questions, please call your gastroenterologist to clarify.  If you requested that your care partner not be given the details of your procedure findings, then the procedure report has been included in a sealed envelope for you to review at your convenience later.  YOU SHOULD EXPECT: Some feelings of bloating in the abdomen. Passage of more gas than usual.  Walking can help get rid of the air that was put into your GI tract during the procedure and reduce the bloating. If you had a lower endoscopy (such as a colonoscopy or flexible sigmoidoscopy) you may notice spotting of blood in your stool or on the toilet paper. If you underwent a bowel prep for your procedure, then you may not have a normal bowel movement for a few days.  DIET: Your first meal following the procedure should be a light meal and then it is ok to progress to your normal diet.  A half-sandwich or bowl of soup is an example of a good first meal.  Heavy or fried foods are harder to digest and may make you feel nauseous or bloated.  Likewise meals heavy in dairy and vegetables can cause extra gas to form and this can also increase the bloating.  Drink plenty of fluids but you should avoid alcoholic beverages for 24 hours.  ACTIVITY: Your care partner should take you home directly after the procedure.  You should plan to take it easy, moving slowly for the rest of the day.  You can resume normal activity the day after the procedure however you should NOT DRIVE or use heavy machinery for 24 hours (because of the sedation medicines used during the test).    SYMPTOMS TO REPORT IMMEDIATELY: A gastroenterologist can be reached at any hour.  During normal business hours, 8:30 AM to 5:00 PM Monday through Friday,  call [phone].  After hours and on weekends, please call the GI answering service at [phone] who will take a message and have the physician on call contact you.   Following lower endoscopy (colonoscopy or flexible sigmoidoscopy):  Excessive amounts of blood in the stool  Significant tenderness or worsening of abdominal pains  Swelling of the abdomen that is new, acute  Fever of 100F or higher    FOLLOW UP: If any biopsies were taken you will be contacted by phone or by letter within the next 1-3 weeks.  Call your gastroenterologist if you have not heard about the biopsies in 3 weeks.  Our staff will call the home number listed on your records the next business day following your procedure to check on you and address any questions or concerns that you may have at that time regarding the information given to you following your procedure. This is a courtesy call and so if there is no answer at the home number and we have not heard from you through the emergency physician on call, we will assume that you have returned to your regular daily activities without incident.  SIGNATURES/CONFIDENTIALITY: You and/or your care partner have signed paperwork which will be entered into your electronic medical record.  These signatures attest to the fact that that the information above on your After Visit Summary has been reviewed and is understood.  Full responsibility of the confidentiality   of this discharge information lies with you and/or your care-partner.  Diverticulosis, high fiber diet, polyp, and hemorrhoid information given.

## 2013-05-17 NOTE — Progress Notes (Signed)
A/ox3 pleased with MAC, report to jane RN 

## 2013-05-20 ENCOUNTER — Telehealth: Payer: Self-pay | Admitting: *Deleted

## 2013-05-20 NOTE — Telephone Encounter (Signed)
  Follow up Call-  Call back number 05/17/2013  Post procedure Call Back phone  # [phone]  Permission to leave phone message Yes     Patient questions:  Do you have a fever, pain , or abdominal swelling? no Pain Score  0 *  Have you tolerated food without any problems? yes  Have you been able to return to your normal activities? yes  Do you have any questions about your discharge instructions: Diet   no Medications  no Follow up visit  no  Do you have questions or concerns about your Care? no  Actions: * If pain score is 4 or above: No action needed, pain <4.

## 2013-05-21 ENCOUNTER — Encounter: Payer: Self-pay | Admitting: Internal Medicine

## 2013-07-11 ENCOUNTER — Ambulatory Visit: Payer: BC Managed Care – PPO | Admitting: Internal Medicine

## 2013-07-16 ENCOUNTER — Other Ambulatory Visit: Payer: BC Managed Care – PPO

## 2013-07-29 ENCOUNTER — Ambulatory Visit (INDEPENDENT_AMBULATORY_CARE_PROVIDER_SITE_OTHER): Payer: BC Managed Care – PPO | Admitting: Internal Medicine

## 2013-07-29 ENCOUNTER — Encounter: Payer: Self-pay | Admitting: Internal Medicine

## 2013-07-29 VITALS — BP 120/70 | HR 84 | Temp 97.8°F | Wt 223.0 lb

## 2013-07-29 DIAGNOSIS — E538 Deficiency of other specified B group vitamins: Secondary | ICD-10-CM

## 2013-07-29 DIAGNOSIS — E785 Hyperlipidemia, unspecified: Secondary | ICD-10-CM

## 2013-07-29 DIAGNOSIS — E119 Type 2 diabetes mellitus without complications: Secondary | ICD-10-CM

## 2013-07-29 LAB — HM DIABETES FOOT EXAM

## 2013-07-29 NOTE — Assessment & Plan Note (Signed)
Good control

## 2013-07-29 NOTE — Assessment & Plan Note (Signed)
Seems to have excellent control May need to cut back on levemir if more sig low sugar reactions

## 2013-07-29 NOTE — Progress Notes (Signed)
Subjective:    Patient ID: Aaron Zimmerman, male    DOB: 1950/07/22, 63 y.o.   MRN: [id number]  HPI Transfer of care  Doing well Checks sugars every 3 days or so Can be as low as 70-80---may have slight lightheadedness Might need to cut back the determir slightly if this persists Not great about eating--- forgets meals, skips breakfasts Got counseling at [facility] for [facility] exam---just scheduled. No retinopathy No foot numbness or pain  Doesn't know if his cholesterol is high On the statin due to DM  No known diabetes On the lisinopril though  Itching in past Sensitive to water Seems to be better now  Current Outpatient Prescriptions on File Prior to Visit  Medication Sig Dispense Refill  . aspirin 325 MG tablet Take 1 tablet (325 mg total) by mouth daily.  300 tablet  1  . Canagliflozin (INVOKANA) 300 MG TABS Take 300 mg by mouth every morning.  30 tablet  11  . Cyanocobalamin (B-12) 1000 MCG CAPS Take by mouth daily.      Marland Kitchen glucose blood (ONE TOUCH ULTRA TEST) test strip Use 1 strip 3 times a day to check blood glucose as directed  100 each  12  . insulin detemir (LEVEMIR) 100 UNIT/ML injection Inject 0.6 mLs (60 Units total) into the skin at bedtime.  15 mL  11  . Insulin Pen Needle 32G X 6 MM MISC Use as directed with Levemir Flex Pen  100 each  11  . lisinopril (PRINIVIL,ZESTRIL) 10 MG tablet TAKE 1 TABLET BY MOUTH DAILY  30 tablet  5  . loratadine (CLARITIN) 10 MG tablet Take 10 mg by mouth daily.      . metFORMIN (GLUCOPHAGE) 1000 MG tablet TAKE ONE (1) TABLET BY MOUTH TWO (2) TIMES DAILY WITH A MEAL  60 tablet  5  . simvastatin (ZOCOR) 20 MG tablet Take 1 tablet (20 mg total) by mouth at bedtime.  90 tablet  3   No current facility-administered medications on file prior to visit.    Allergies  Allergen Reactions  . Pioglitazone Rash    Past Medical History  Diagnosis Date  . Hyperlipidemia   . Personal history of urinary calculi   . Vertigo   .  Diabetes mellitus ~2006    Past Surgical History  Procedure Laterality Date  . Traumatic injury and repair of rt hand  1974    Family History  Problem Relation Age of Onset  . Heart disease Mother     CAD/PCI with stents; cardiomyopathy with AICD  . Cancer Other     Colon and Prostate  . Colon cancer Neg Hx     History   Social History  . Marital Status: Married    Spouse Name: N/A    Number of Children: 2  . Years of Education: N/A   Occupational History  . 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 Smoking status: Never Smoker   . Smokeless tobacco: Former Systems developer    Types: [place] date: 02/08/1995  . Alcohol Use: No  . Drug Use: No  . Sexual Activity: Yes   Other Topics Concern  . Not on file   Social History Narrative   HSG   Married 1979   1 son 62 1 daughter 77; 1 granddaughter Eliezer Lofts '07), 1 grandson (Seth '09)   Work: [employer]   Regular exercise: walking when he can  Caffeine use: coffee all day   Review of Systems Appetite is okay Weight is down 5# since last visit Mild allergy symptoms--uses loratadine only prn Bruises easy if he takes the aspirin every day    Objective:   Physical Exam  Constitutional: He appears well-developed and well-nourished. No distress.  Neck: Normal range of motion. Neck supple. No thyromegaly present.  Cardiovascular: Normal rate, regular rhythm, normal heart sounds and intact distal pulses.  Exam reveals no gallop.   No murmur heard. Pulmonary/Chest: Effort normal and breath sounds normal. No respiratory distress. He has no wheezes. He has no rales.  Musculoskeletal: He exhibits no edema and no tenderness.  Lymphadenopathy:    He has no cervical adenopathy.  Neurological:  Normal sensation in feet  Skin:  No foot lesions  Psychiatric: He has a normal mood and affect. His behavior is normal.          Assessment & Plan:

## 2013-07-29 NOTE — Progress Notes (Signed)
Pre visit review using our clinic review tool, if applicable. No additional management support is needed unless otherwise documented below in the visit note. 

## 2013-07-29 NOTE — Assessment & Plan Note (Signed)
Unclear diagnosis Did take some oral supplements but stopped months ago

## 2013-07-30 LAB — VITAMIN B12: Vitamin B-12: 282 pg/mL (ref 211–911)

## 2013-07-30 LAB — HEMOGLOBIN A1C: Hgb A1c MFr Bld: 7.5 % — ABNORMAL HIGH (ref 4.6–6.5)

## 2013-08-08 ENCOUNTER — Other Ambulatory Visit: Payer: Self-pay | Admitting: *Deleted

## 2013-08-08 MED ORDER — CANAGLIFLOZIN 300 MG PO TABS: 300.0000 mg | ORAL_TABLET | Freq: Every morning | ORAL | Status: DC

## 2013-09-09 LAB — HM DIABETES EYE EXAM

## 2013-09-11 ENCOUNTER — Other Ambulatory Visit: Payer: Self-pay | Admitting: *Deleted

## 2013-09-11 MED ORDER — METFORMIN HCL 1000 MG PO TABS: ORAL_TABLET | ORAL | Status: DC

## 2013-09-13 ENCOUNTER — Other Ambulatory Visit: Payer: Self-pay | Admitting: *Deleted

## 2013-09-13 MED ORDER — LISINOPRIL 10 MG PO TABS: ORAL_TABLET | ORAL | Status: DC

## 2013-09-16 ENCOUNTER — Other Ambulatory Visit: Payer: Self-pay | Admitting: *Deleted

## 2013-09-16 MED ORDER — SIMVASTATIN 20 MG PO TABS: 20.0000 mg | ORAL_TABLET | Freq: Every day | ORAL | Status: DC

## 2013-10-29 ENCOUNTER — Encounter: Payer: Self-pay | Admitting: Internal Medicine

## 2013-10-29 ENCOUNTER — Ambulatory Visit (INDEPENDENT_AMBULATORY_CARE_PROVIDER_SITE_OTHER): Payer: BC Managed Care – PPO | Admitting: Internal Medicine

## 2013-10-29 VITALS — BP 117/67 | HR 70 | Temp 98.4°F | Wt 218.0 lb

## 2013-10-29 DIAGNOSIS — J0110 Acute frontal sinusitis, unspecified: Secondary | ICD-10-CM

## 2013-10-29 MED ORDER — AMOXICILLIN 500 MG PO TABS: 1000.0000 mg | ORAL_TABLET | Freq: Two times a day (BID) | ORAL | Status: DC

## 2013-10-29 NOTE — Progress Notes (Signed)
Pre visit review using our clinic review tool, if applicable. No additional management support is needed unless otherwise documented below in the visit note. 

## 2013-10-29 NOTE — Assessment & Plan Note (Signed)
Still not improving after more than a week Discussed supportive care Will treat with amoxil

## 2013-10-29 NOTE — Progress Notes (Signed)
Subjective:    Patient ID: Aaron Zimmerman, male    DOB: 04-14-1950, 63 y.o.   MRN: [id number]  HPI Has had sick for 8-9 days Has been coughing and congestion Post nasal drip Some headache--frontal  Cough now dry--- had yellow and mixed sputum up till yesterday Fever last week but better now Tylenol some help Gets exhausted by end of work day Missed 3 days of work last week---had been improving this week but then worsening at work Intermittent sore throat No SOB No ear pain  Tried OTC allergy pill--- ?not sure if it helped OTC cough med helping at night---makes him sleepy in day  Current Outpatient Prescriptions on File Prior to Visit  Medication Sig Dispense Refill  . aspirin 325 MG tablet Take 325 mg by mouth every other day.      . Canagliflozin (INVOKANA) 300 MG TABS Take 1 tablet (300 mg total) by mouth every morning.  30 tablet  2  . Cyanocobalamin (B-12) 1000 MCG CAPS Take by mouth daily.      Marland Kitchen glucose blood (ONE TOUCH ULTRA TEST) test strip Use 1 strip 3 times a day to check blood glucose as directed  100 each  12  . insulin detemir (LEVEMIR) 100 UNIT/ML injection Inject 0.6 mLs (60 Units total) into the skin at bedtime.  15 mL  11  . Insulin Pen Needle 32G X 6 MM MISC Use as directed with Levemir Flex Pen  100 each  11  . lisinopril (PRINIVIL,ZESTRIL) 10 MG tablet TAKE 1 TABLET BY MOUTH DAILY  30 tablet  3  . loratadine (CLARITIN) 10 MG tablet Take 10 mg by mouth daily.      . metFORMIN (GLUCOPHAGE) 1000 MG tablet TAKE ONE (1) TABLET BY MOUTH TWO (2) TIMES DAILY WITH A MEAL  60 tablet  5  . simvastatin (ZOCOR) 20 MG tablet Take 1 tablet (20 mg total) by mouth at bedtime.  90 tablet  0   No current facility-administered medications on file prior to visit.    Allergies  Allergen Reactions  . Pioglitazone Rash    Past Medical History  Diagnosis Date  . Hyperlipidemia   . Personal history of urinary calculi   . Vertigo   . Diabetes mellitus ~2006    Past  Surgical History  Procedure Laterality Date  . Traumatic injury and repair of rt hand  1974    Family History  Problem Relation Age of Onset  . Heart disease Mother     CAD/PCI with stents; cardiomyopathy with AICD  . Cancer Other     Colon and Prostate  . Colon cancer Neg Hx     History   Social History  . Marital Status: Married    Spouse Name: N/A    Number of Children: 2  . Years of Education: N/A   Occupational History  . 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 Smoking status: Never Smoker   . Smokeless tobacco: Former Systems developer    Types: [place] date: 02/08/1995  . Alcohol Use: No  . Drug Use: No  . Sexual Activity: Yes   Other Topics Concern  . Not on file   Social History Narrative   HSG   Married 1979   1 son 69 1 daughter 77; 1 granddaughter Eliezer Lofts '07), 1 grandson (Seth '09)   Work: [employer]   Regular exercise: walking when he can   Caffeine use:  coffee all day   Review of Systems No rash 1 day of vomiting and loose stool--may be from the PND--- none in 6 days Appetite is off    Objective:   Physical Exam  Constitutional: He appears well-developed and well-nourished. No distress.  HENT:  Mouth/Throat: Oropharynx is clear and moist. No oropharyngeal exudate.  Bilateral frontal tenderness TMs blocked with cerumen Moderate nasal inflammation  Neck: Normal range of motion. Neck supple. No thyromegaly present.  Pulmonary/Chest: Effort normal and breath sounds normal. No respiratory distress. He has no wheezes. He has no rales.  Lymphadenopathy:    He has no cervical adenopathy.  Skin: No rash noted.  Clammy on back          Assessment & Plan:

## 2013-11-05 ENCOUNTER — Other Ambulatory Visit: Payer: Self-pay

## 2013-11-05 NOTE — Telephone Encounter (Signed)
Pt left v/m requesting refill amoxicillin; pt was seen 10/29/13 and is a lot better but still not well and request 5 more days of antibiotic to [facility]; if pt was prescribed # 40 that should have lasted 10 days but pts v/m said pt was out of med. Unable to reach pt by phone.Please advise.

## 2013-11-05 NOTE — Telephone Encounter (Signed)
That is correct. Not sure how he used it all so fast  Check and see how he is doing. If he is a lot better but not quite over this, okay to send another #20 Confirm he knows 2 tabs TWICE a day

## 2013-11-06 MED ORDER — AMOXICILLIN 500 MG PO TABS: 1000.0000 mg | ORAL_TABLET | Freq: Two times a day (BID) | ORAL | Status: DC

## 2013-11-06 NOTE — Telephone Encounter (Signed)
Spoke to patient's wife and she stated that patient is doing better but not quite over it. Informed wife that a refill for 20 tabs will be sent to preferred pharmacy and to take as directed. Wife verbalized understanding. Also advised her to call the office if things didn't get better or began to get worse.

## 2013-11-15 ENCOUNTER — Other Ambulatory Visit: Payer: Self-pay | Admitting: Internal Medicine

## 2013-12-09 ENCOUNTER — Other Ambulatory Visit: Payer: Self-pay | Admitting: *Deleted

## 2013-12-09 MED ORDER — INSULIN DETEMIR 100 UNIT/ML ~~LOC~~ SOLN: 60.0000 [IU] | Freq: Every day | SUBCUTANEOUS | Status: DC

## 2014-01-10 ENCOUNTER — Encounter: Payer: Self-pay | Admitting: Internal Medicine

## 2014-01-10 ENCOUNTER — Ambulatory Visit (INDEPENDENT_AMBULATORY_CARE_PROVIDER_SITE_OTHER): Payer: BC Managed Care – PPO | Admitting: Internal Medicine

## 2014-01-10 VITALS — BP 122/68 | HR 67 | Temp 97.4°F | Ht 67.0 in | Wt 217.2 lb

## 2014-01-10 DIAGNOSIS — E119 Type 2 diabetes mellitus without complications: Secondary | ICD-10-CM

## 2014-01-10 DIAGNOSIS — Z23 Encounter for immunization: Secondary | ICD-10-CM

## 2014-01-10 DIAGNOSIS — R6882 Decreased libido: Secondary | ICD-10-CM

## 2014-01-10 DIAGNOSIS — E538 Deficiency of other specified B group vitamins: Secondary | ICD-10-CM

## 2014-01-10 DIAGNOSIS — E785 Hyperlipidemia, unspecified: Secondary | ICD-10-CM

## 2014-01-10 DIAGNOSIS — B351 Tinea unguium: Secondary | ICD-10-CM

## 2014-01-10 NOTE — Addendum Note (Signed)
Addended by: Viviana Simpler I on: 01/10/2014 12:54 PM   Modules accepted: Orders, Medications

## 2014-01-10 NOTE — Progress Notes (Signed)
Subjective:    Patient ID: Aaron Zimmerman, male    DOB: Jun 24, 1950, 63 y.o.   MRN: [id number]  HPI Here for physical--but had one in January  Diabetes okay Checks every morning-- 100-159 No hypoglycemic spells Not exercising--but hopes to go back to gym soon Weight up slightly No problems with feet  Decreased libido still No ED Testosterone okay in past  No chest pain No dizziness or syncope Breathing is okay No edema  No problems with statin No myalgias or GI problems Bowels are fine  Current Outpatient Prescriptions on File Prior to Visit  Medication Sig Dispense Refill  . aspirin 325 MG tablet Take 325 mg by mouth every other day.    . Cyanocobalamin (B-12) 1000 MCG CAPS Take by mouth daily.    Marland Kitchen glucose blood (ONE TOUCH ULTRA TEST) test strip Use 1 strip 3 times a day to check blood glucose as directed 100 each 12  . insulin detemir (LEVEMIR) 100 UNIT/ML injection Inject 0.6 mLs (60 Units total) into the skin at bedtime. 15 mL 1  . Insulin Pen Needle 32G X 6 MM MISC Use as directed with Levemir Flex Pen 100 each 11  . INVOKANA 300 MG TABS TAKE ONE TABLET BY MOUTH EVERY MORNING 30 tablet 5  . lisinopril (PRINIVIL,ZESTRIL) 10 MG tablet TAKE 1 TABLET BY MOUTH DAILY 30 tablet 3  . loratadine (CLARITIN) 10 MG tablet Take 10 mg by mouth daily.    . metFORMIN (GLUCOPHAGE) 1000 MG tablet TAKE ONE (1) TABLET BY MOUTH TWO (2) TIMES DAILY WITH A MEAL 60 tablet 5  . simvastatin (ZOCOR) 20 MG tablet Take 1 tablet (20 mg total) by mouth at bedtime. 90 tablet 0   No current facility-administered medications on file prior to visit.    Allergies  Allergen Reactions  . Pioglitazone Rash    Past Medical History  Diagnosis Date  . Hyperlipidemia   . Personal history of urinary calculi   . Vertigo   . Diabetes mellitus ~2006    Past Surgical History  Procedure Laterality Date  . Traumatic injury and repair of rt hand  1974    Family History  Problem Relation Age of  Onset  . Heart disease Mother     CAD/PCI with stents; cardiomyopathy with AICD  . Cancer Other     Colon and Prostate  . Colon cancer Neg Hx     History   Social History  . Marital Status: Married    Spouse Name: N/A    Number of Children: 2  . Years of Education: N/A   Occupational History  . 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 Smoking status: Never Smoker   . Smokeless tobacco: Former Systems developer    Types: [place] date: 02/08/1995  . Alcohol Use: No  . Drug Use: No  . Sexual Activity: Yes   Other Topics Concern  . Not on file   Social History Narrative   HSG   Married 1979   1 son 39 1 daughter 65; 1 granddaughter Eliezer Lofts '07), 1 grandson (Seth '09)   Work: [employer]   Regular exercise: walking when he can   Caffeine use: coffee all day   Review of Systems Still gets rare spells of vertigo Sleeps okay Mild mood issues---stress with new granddaughter, wife having TKR, etc Some dental work needed Rare tinnitus--hearing okay    Objective:   Physical Exam  Constitutional:  He appears well-developed and well-nourished. No distress.  HENT:  Mouth/Throat: Oropharynx is clear and moist. No oropharyngeal exudate.  Neck: Normal range of motion. Neck supple. No thyromegaly present.  Cardiovascular: Normal rate, regular rhythm, normal heart sounds and intact distal pulses.  Exam reveals no gallop.   No murmur heard. Pulmonary/Chest: Effort normal and breath sounds normal. No respiratory distress. He has no wheezes. He has no rales.  Abdominal: Soft. There is no tenderness.  Musculoskeletal: He exhibits no edema or tenderness.  Neurological:  Normal sensation in plantar feet  Skin: No rash noted. No erythema.  Mild plantar callous but no ulcers  Psychiatric: He has a normal mood and affect. His behavior is normal.          Assessment & Plan:

## 2014-01-10 NOTE — Assessment & Plan Note (Signed)
No ED Discussed this-- further eval not indicated

## 2014-01-10 NOTE — Progress Notes (Signed)
Pre visit review using our clinic review tool, if applicable. No additional management support is needed unless otherwise documented below in the visit note. 

## 2014-01-10 NOTE — Assessment & Plan Note (Signed)
Will recheck labs 

## 2014-01-10 NOTE — Assessment & Plan Note (Signed)
No pain or infection No action needed

## 2014-01-10 NOTE — Assessment & Plan Note (Signed)
Still seems to have acceptable control Will check A1c 

## 2014-01-10 NOTE — Assessment & Plan Note (Signed)
No problems with statin Due for labs 

## 2014-01-12 LAB — HEMOGLOBIN A1C: HEMOGLOBIN A1C: 8.2 % — AB (ref 4.6–6.5)

## 2014-01-12 LAB — COMPREHENSIVE METABOLIC PANEL
ALK PHOS: 49 U/L (ref 39–117)
ALT: 26 U/L (ref 0–53)
AST: 25 U/L (ref 0–37)
Albumin: 4.7 g/dL (ref 3.5–5.2)
BILIRUBIN TOTAL: 0.5 mg/dL (ref 0.2–1.2)
BUN: 25 mg/dL — ABNORMAL HIGH (ref 6–23)
CO2: 23 mEq/L (ref 19–32)
CREATININE: 1.1 mg/dL (ref 0.4–1.5)
Calcium: 9.6 mg/dL (ref 8.4–10.5)
Chloride: 110 mEq/L (ref 96–112)
GFR: 75.45 mL/min (ref >60.00)
Glucose, Bld: 76 mg/dL (ref 70–99)
Potassium: 4.8 mEq/L (ref 3.5–5.1)
Sodium: 145 mEq/L (ref 135–145)
Total Protein: 7.6 g/dL (ref 6.0–8.3)

## 2014-01-12 LAB — LIPID PANEL
Cholesterol: 147 mg/dL (ref 0–200)
HDL: 37.9 mg/dL — ABNORMAL LOW (ref >39.00)
LDL Cholesterol: 94 mg/dL (ref 0–99)
NonHDL: 109.1
Total CHOL/HDL Ratio: 4
Triglycerides: 78 mg/dL (ref 0.0–149.0)
VLDL: 15.6 mg/dL (ref 0.0–40.0)

## 2014-01-12 LAB — CBC WITH DIFFERENTIAL/PLATELET
Basophils Absolute: 0 10*3/uL (ref 0.0–0.1)
Basophils Relative: 0.3 % (ref 0.0–3.0)
EOS PCT: 0.9 % (ref 0.0–5.0)
Eosinophils Absolute: 0.1 10*3/uL (ref 0.0–0.7)
HCT: 49.6 % (ref 39.0–52.0)
Hemoglobin: 15.9 g/dL (ref 13.0–17.0)
Lymphocytes Relative: 25.7 % (ref 12.0–46.0)
Lymphs Abs: 1.9 10*3/uL (ref 0.7–4.0)
MCHC: 32 g/dL (ref 30.0–36.0)
MCV: 87.3 fl (ref 78.0–100.0)
MONO ABS: 0.3 10*3/uL (ref 0.1–1.0)
MONOS PCT: 4.2 % (ref 3.0–12.0)
NEUTROS PCT: 68.9 % (ref 43.0–77.0)
Neutro Abs: 5.1 10*3/uL (ref 1.4–7.7)
Platelets: 216 10*3/uL (ref 150.0–400.0)
RBC: 5.69 Mil/uL (ref 4.22–5.81)
RDW: 13.5 % (ref 11.5–15.5)
WBC: 7.4 10*3/uL (ref 4.0–10.5)

## 2014-01-12 LAB — VITAMIN B12: Vitamin B-12: 265 pg/mL (ref 211–911)

## 2014-01-13 LAB — T4, FREE: FREE T4: 1.06 ng/dL (ref 0.60–1.60)

## 2014-01-20 ENCOUNTER — Other Ambulatory Visit: Payer: Self-pay | Admitting: Internal Medicine

## 2014-01-21 ENCOUNTER — Telehealth: Payer: Self-pay

## 2014-01-21 NOTE — Telephone Encounter (Signed)
Pt received my chart note last week about pt needs to get pneumonia vaccine,shingles vaccine and urine test for protein.pt had prevnar 13 03/06/13. Pt request cb if needs any immunizations or testing.

## 2014-01-22 NOTE — Telephone Encounter (Signed)
Please let him know that he had 1 of the pneumonia vaccines--but will need the other on his next visit (they need to be a year apart)  He does not need the urine test because he is already on the treatment to prevent diabetic kidney problems (the lisinopril). It is a problem with the MyChart notification system that it doesn't recognize this

## 2014-01-22 NOTE — Telephone Encounter (Signed)
Spoke with patient and advised results   

## 2014-02-24 ENCOUNTER — Other Ambulatory Visit: Payer: Self-pay | Admitting: Internal Medicine

## 2014-05-05 ENCOUNTER — Other Ambulatory Visit: Payer: Self-pay | Admitting: Internal Medicine

## 2014-05-16 ENCOUNTER — Other Ambulatory Visit: Payer: Self-pay | Admitting: Internal Medicine

## 2014-05-16 NOTE — Telephone Encounter (Signed)
Simvastatin last filled 09/2014 #90--is pt still supposed to take this medication--please advise

## 2014-05-16 NOTE — Telephone Encounter (Signed)
Rx sent through e-scribe  

## 2014-05-16 NOTE — Telephone Encounter (Signed)
Approved: he should be on it Fill both for a year and make sure he knows he should take them daily

## 2014-06-07 ENCOUNTER — Other Ambulatory Visit: Payer: Self-pay | Admitting: Internal Medicine

## 2014-06-09 NOTE — Telephone Encounter (Signed)
rx sent to pharmacy by e-script  

## 2014-07-08 ENCOUNTER — Other Ambulatory Visit: Payer: Self-pay | Admitting: Internal Medicine

## 2014-07-08 ENCOUNTER — Other Ambulatory Visit: Payer: Self-pay | Admitting: *Deleted

## 2014-07-08 MED ORDER — INSULIN GLARGINE 100 UNIT/ML SOLOSTAR PEN: 60.0000 [IU] | PEN_INJECTOR | Freq: Every day | SUBCUTANEOUS | Status: DC

## 2014-07-14 ENCOUNTER — Ambulatory Visit (INDEPENDENT_AMBULATORY_CARE_PROVIDER_SITE_OTHER): Payer: BC Managed Care – PPO | Admitting: Internal Medicine

## 2014-07-14 ENCOUNTER — Encounter: Payer: Self-pay | Admitting: Internal Medicine

## 2014-07-14 VITALS — BP 130/70 | HR 73 | Temp 98.2°F | Wt 230.0 lb

## 2014-07-14 DIAGNOSIS — E1169 Type 2 diabetes mellitus with other specified complication: Secondary | ICD-10-CM | POA: Diagnosis not present

## 2014-07-14 DIAGNOSIS — IMO0001 Reserved for inherently not codable concepts without codable children: Secondary | ICD-10-CM

## 2014-07-14 DIAGNOSIS — L299 Pruritus, unspecified: Secondary | ICD-10-CM

## 2014-07-14 DIAGNOSIS — E1165 Type 2 diabetes mellitus with hyperglycemia: Secondary | ICD-10-CM

## 2014-07-14 DIAGNOSIS — N521 Erectile dysfunction due to diseases classified elsewhere: Secondary | ICD-10-CM

## 2014-07-14 DIAGNOSIS — E785 Hyperlipidemia, unspecified: Secondary | ICD-10-CM

## 2014-07-14 LAB — HEMOGLOBIN A1C: Hgb A1c MFr Bld: 7.2 % — ABNORMAL HIGH (ref 4.6–6.5)

## 2014-07-14 LAB — HM DIABETES FOOT EXAM

## 2014-07-14 MED ORDER — HYDROXYZINE HCL 25 MG PO TABS: 25.0000 mg | ORAL_TABLET | Freq: Three times a day (TID) | ORAL | Status: DC | PRN

## 2014-07-14 MED ORDER — INSULIN GLARGINE 100 UNIT/ML SOLOSTAR PEN: 60.0000 [IU] | PEN_INJECTOR | Freq: Every day | SUBCUTANEOUS | Status: DC

## 2014-07-14 MED ORDER — SILDENAFIL CITRATE 20 MG PO TABS: 60.0000 mg | ORAL_TABLET | Freq: Every day | ORAL | Status: DC | PRN

## 2014-07-14 NOTE — Progress Notes (Signed)
Pre visit review using our clinic review tool, if applicable. No additional management support is needed unless otherwise documented below in the visit note. 

## 2014-07-14 NOTE — Progress Notes (Signed)
Subjective:    Patient ID: Aaron Zimmerman, male    DOB: 1950-02-25, 64 y.o.   MRN: [id number]  HPI Here for follow up of diabetes and other medical conditions  He feels the diabetes is okay Will occasionally forget his insulin at night--falls asleep and then just goes to bed Has another month of levemir-- will switch to lantus hoping to get pharmacy card No hypoglycemic spells Checks sugars daily in AM---usually under 130 Backed off on levemir due to sugars under 120 all the time (now 55) No sores or pain in feet  Ongoing allergy problems If he gets wet--he itches Thinks he got hydroxyzine from allergy doctor and it helped  He notes decreased libido Wife is disturbed by this Also notes some ED Wants to try medication for this  No problems with statin No myalgias or GI trouble  Current Outpatient Prescriptions on File Prior to Visit  Medication Sig Dispense Refill  . aspirin 81 MG tablet Take 81 mg by mouth daily.    . Cyanocobalamin (B-12) 1000 MCG CAPS Take by mouth daily.    Marland Kitchen glucose blood (ONE TOUCH ULTRA TEST) test strip Use to check blood sugar three times daily dx: E11.9 300 each 3  . Insulin Glargine (LANTUS SOLOSTAR) 100 UNIT/ML Solostar Pen Inject 60 Units into the skin daily at 10 pm. Dx: E11.9 5 pen 3  . Insulin Pen Needle 32G X 6 MM MISC Use as directed with Levemir Flex Pen 100 each 11  . INVOKANA 300 MG TABS tablet TAKE ONE TABLET BY MOUTH EVERY MORNING 90 tablet 3  . lisinopril (PRINIVIL,ZESTRIL) 10 MG tablet TAKE 1 TABLET BY MOUTH DAILY 90 tablet 3  . loratadine (CLARITIN) 10 MG tablet Take 10 mg by mouth daily.    . metFORMIN (GLUCOPHAGE) 1000 MG tablet TAKE 1 TABLET BY MOUTH TWICE A DAY WITH A MEAL 60 tablet 11  . simvastatin (ZOCOR) 20 MG tablet TAKE ONE TABLET BY MOUTH EVERY NIGHT AT BEDTIME 90 tablet 3   No current facility-administered medications on file prior to visit.    Allergies  Allergen Reactions  . Pioglitazone Rash    Past Medical  History  Diagnosis Date  . Hyperlipidemia   . Personal history of urinary calculi   . Vertigo   . Diabetes mellitus ~2006    Past Surgical History  Procedure Laterality Date  . Traumatic injury and repair of rt hand  1974    Family History  Problem Relation Age of Onset  . Heart disease Mother     CAD/PCI with stents; cardiomyopathy with AICD  . Cancer Other     Colon and Prostate  . Colon cancer Neg Hx     History   Social History  . Marital Status: Married    Spouse Name: N/A  . Number of Children: 2  . Years of Education: N/A   Occupational History  . 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 Smoking status: Never Smoker   . Smokeless tobacco: Former Systems developer    Types: [place] date: 02/08/1995  . Alcohol Use: No  . Drug Use: No  . Sexual Activity: Yes   Other Topics Concern  . Not on file   Social History Narrative   HSG   Married 1979   1 son 10 1 daughter 52; 1 granddaughter Eliezer Lofts '07), 1 grandson (Seth '09)   Work: [employer]   Regular exercise: walking  when he can   Caffeine use: coffee all day   Review of Systems  Weight back up to 230# Sleeps okay Bowels are fine--did have problem once after drinking too much tomato juice No chest pain No SOB     Objective:   Physical Exam  Constitutional: He appears well-developed and well-nourished. No distress.  Neck: Normal range of motion. Neck supple. No thyromegaly present.  Cardiovascular: Normal rate, regular rhythm, normal heart sounds and intact distal pulses.  Exam reveals no gallop.   No murmur heard. Pulmonary/Chest: Effort normal and breath sounds normal. No respiratory distress. He has no wheezes. He has no rales.  Musculoskeletal: He exhibits no edema.  Lymphadenopathy:    He has no cervical adenopathy.  Neurological:  Normal sensation in plantar feet  Psychiatric: He has a normal mood and affect. His behavior is  normal.          Assessment & Plan:

## 2014-07-14 NOTE — Assessment & Plan Note (Signed)
When he gets wet No rash Will refill atarax from the dermatologist

## 2014-07-14 NOTE — Assessment & Plan Note (Signed)
No problems with the statin

## 2014-07-14 NOTE — Assessment & Plan Note (Signed)
Discussed fitness Will switch insulin to AM--so he doesn't forget it May need to increase back to 55

## 2014-07-14 NOTE — Assessment & Plan Note (Signed)
Libido an issue too Will try the sildenafil

## 2014-08-04 ENCOUNTER — Other Ambulatory Visit: Payer: Self-pay

## 2014-09-19 ENCOUNTER — Encounter (HOSPITAL_COMMUNITY): Payer: Self-pay | Admitting: Emergency Medicine

## 2014-09-19 ENCOUNTER — Emergency Department (HOSPITAL_COMMUNITY)
Admission: EM | Admit: 2014-09-19 | Discharge: 2014-09-19 | Disposition: A | Discharge status: Home / Self Care | Payer: BC Managed Care – PPO | Attending: Emergency Medicine | Admitting: Emergency Medicine

## 2014-09-19 ENCOUNTER — Telehealth: Payer: Self-pay | Admitting: Internal Medicine

## 2014-09-19 DIAGNOSIS — Z794 Long term (current) use of insulin: Secondary | ICD-10-CM | POA: Diagnosis not present

## 2014-09-19 DIAGNOSIS — R112 Nausea with vomiting, unspecified: Secondary | ICD-10-CM | POA: Diagnosis present

## 2014-09-19 DIAGNOSIS — R000 Tachycardia, unspecified: Secondary | ICD-10-CM | POA: Diagnosis not present

## 2014-09-19 DIAGNOSIS — Z87442 Personal history of urinary calculi: Secondary | ICD-10-CM | POA: Diagnosis not present

## 2014-09-19 DIAGNOSIS — E119 Type 2 diabetes mellitus without complications: Secondary | ICD-10-CM | POA: Insufficient documentation

## 2014-09-19 DIAGNOSIS — E785 Hyperlipidemia, unspecified: Secondary | ICD-10-CM | POA: Diagnosis not present

## 2014-09-19 DIAGNOSIS — Z7982 Long term (current) use of aspirin: Secondary | ICD-10-CM | POA: Insufficient documentation

## 2014-09-19 DIAGNOSIS — T7840XA Allergy, unspecified, initial encounter: Secondary | ICD-10-CM

## 2014-09-19 DIAGNOSIS — Z79899 Other long term (current) drug therapy: Secondary | ICD-10-CM | POA: Insufficient documentation

## 2014-09-19 MED ORDER — ONDANSETRON 4 MG PO TBDP: 4.0000 mg | ORAL_TABLET | Freq: Once | ORAL | Status: DC

## 2014-09-19 MED ORDER — SODIUM CHLORIDE 0.9 % IV BOLUS (SEPSIS)
1000.0000 mL | Freq: Once | INTRAVENOUS | Status: AC
  Administered 2014-09-19: 1000 mL via INTRAVENOUS

## 2014-09-19 MED ORDER — FAMOTIDINE 20 MG PO TABS
20.0000 mg | ORAL_TABLET | Freq: Once | ORAL | Status: AC
  Administered 2014-09-19: 20 mg via ORAL
  Filled 2014-09-19: qty 1

## 2014-09-19 MED ORDER — DIPHENHYDRAMINE HCL 25 MG PO CAPS
50.0000 mg | ORAL_CAPSULE | Freq: Once | ORAL | Status: AC
  Administered 2014-09-19: 50 mg via ORAL
  Filled 2014-09-19: qty 2

## 2014-09-19 MED ORDER — DEXAMETHASONE SODIUM PHOSPHATE 10 MG/ML IJ SOLN
10.0000 mg | Freq: Once | INTRAMUSCULAR | Status: AC
  Administered 2014-09-19: 10 mg via INTRAMUSCULAR
  Filled 2014-09-19: qty 1

## 2014-09-19 NOTE — Telephone Encounter (Signed)
[facility] Patient Name: Aaron Zimmerman DOB: May 23, 1950 Initial Comment Caller states her husband was out mowing, he got stung three times and he started throwing up, no energy. Heart racing. He was out in the hot son for awhile. Nurse Assessment Nurse: Donalynn Furlong, RN, Myna Hidalgo Date/Time Eilene Ghazi Time): 09/19/2014 1:05:44 PM Confirm and document reason for call. If symptomatic, describe symptoms. ---Caller states her husband was out mowing, he got stung(yellow jackets) three times and he started throwing up ( x 3 x's), no energy. Heart racing. He was out in the hot son for awhile. BP 119/65. Hr is 95. Breathing /swallowing well, headache. Walking well. Chilling. During this interview, pt heard continuously retching in background, caller states he is now extremely weak. Advised caller to call ambulance, esp if breathing/throat becomes affected as well, caller insists son-in-law can drive them, "it is minutes away" Has the patient traveled out of the country within the last 30 days? ---Not Applicable Does the patient require triage? ---Yes Related visit to physician within the last 2 weeks? ---N/A Does the PT have any chronic conditions? (i.e. diabetes, asthma, etc.) ---Unknown Guidelines Guideline Title Affirmed Question Affirmed Notes Bee or Yellow Jacket Sting [1] Vomiting or abdominal cramps AND [2] started within 2 hours of sting Final Disposition User Go to ED Now Donalynn Furlong, RN, Myna Hidalgo

## 2014-09-19 NOTE — Telephone Encounter (Signed)
PLEASE NOTE: All timestamps contained within this report are represented as [place] Standard Time. CONFIDENTIALTY NOTICE: This fax transmission is intended only for the addressee. It contains information that is legally privileged, confidential or otherwise protected from use or disclosure. If you are not the intended recipient, you are strictly prohibited from reviewing, disclosing, copying using or disseminating any of this information or taking any action in reliance on or regarding this information. If you have received this fax in error, please notify us immediately by telephone so that we can arrange for its return to [place]. Phone: [phone], Toll-Free: [phone], Fax: [phone] Page: 1 of 2 Call Id: [id number] [place] Patient Name: Aaron Zimmerman Gender: Male DOB: [birth year] Age: 64 Y 10 M 7 D Return Phone Number: [phone] (Primary) Address: City/State/Zip:  Client [place] [facility] - Client Client Site [place] - Day Physician Viviana Simpler Contact Type Call Call Type Triage / [place] Name Vaughan Basta Relationship To Patient Spouse Appointment Disposition EMR Appointment Not Necessary Info pasted into Epic Yes Return Phone Number [phone] (Primary) Chief Complaint HEAT - been out in heat and has headache, numbness, tingling or confusion. Initial Comment Caller states her husband was out mowing, he got stung three times and he started throwing up, no energy. Heart racing. He was out in the hot son for awhile. PreDisposition Call Doctor Nurse Assessment Nurse: Donalynn Furlong, RN, Myna Hidalgo Date/Time Eilene Ghazi Time): 09/19/2014 1:05:44 PM Confirm and document reason for call. If symptomatic, describe symptoms. ---Caller states her husband was out mowing, he got stung(yellow jackets) three times and he started throwing up ( x 3  x's), no energy. Heart racing. He was out in the hot son for awhile. BP 119/65. Hr is 95. Breathing /swallowing well, headache. Walking well. Chilling. During this interview, pt heard continuously retching in background, caller states he is now extremely weak. Advised caller to call ambulance, esp if breathing/throat becomes affected as well, caller insists son-in-law can drive them, "it is minutes away" Has the patient traveled out of the country within the last 30 days? ---Not Applicable Does the patient require triage? ---Yes Related visit to physician within the last 2 weeks? ---N/A Does the PT have any chronic conditions? (i.e. diabetes, asthma, etc.) ---Unknown Guidelines Guideline Title Affirmed Question Affirmed Notes Nurse Date/Time (Eastern Time) Bee or Yellow Jacket Sting [1] Vomiting or abdominal cramps AND [2] started within 2 hours of sting Donalynn Furlong, RN, Myna Hidalgo 09/19/2014 1:09:09 PM PLEASE NOTE: All timestamps contained within this report are represented as [place] Standard Time. CONFIDENTIALTY NOTICE: This fax transmission is intended only for the addressee. It contains information that is legally privileged, confidential or otherwise protected from use or disclosure. If you are not the intended recipient, you are strictly prohibited from reviewing, disclosing, copying using or disseminating any of this information or taking any action in reliance on or regarding this information. If you have received this fax in error, please notify us immediately by telephone so that we can arrange for its return to [place]. Phone: [phone], Toll-Free: [phone], Fax: [phone] Page: 2 of 2 Call Id: [id number] [place]. Time Eilene Ghazi Time) Disposition Final User 09/19/2014 1:02:49 PM Send to Urgent Queue Baruch Goldmann 09/19/2014 1:11:08 PM Go to ED Now Yes Donalynn Furlong, RN, Myna Hidalgo Caller Understands: Yes Disagree/Comply: Comply Care Advice Given Per Guideline GO TO ED NOW: You need to  be seen in the Emergency  Department. Go to the ER at ___________ [place] now. Drive carefully. DRIVING: Another adult should drive. TAKE AN ANTIHISTAMINE (e.g., Benadryl) NOW: * If you have it readily available, take a dose of Benadryl (OTC diphenhydramine, 50 mg by mouth). * If you do not have Benadryl, you can use any antihistamine cold medicine that you might have. REMOVE STINGER (if present): APPLY LOCAL COLD - COLD PACK METHOD: * Wrap a bag of ice in a towel. (or a bag of frozen vegetables, like peas) CALL 911 IF: * Difficulty breathing or swallowing occurs. CARE ADVICE given per Bee or Yellow Jacket Sting (Adult) guideline After Care Instructions Given Call Event Type User Date / Time Description Referrals REFERRED TO PCP OFFICE [facility] - ED

## 2014-09-19 NOTE — ED Notes (Signed)
Pt has two bee stings on lower right abdomen, 1 bee sting to upper posterior left arm. Pt tachycardic. Previously stung in past but not w/ these s/s. Pt DENIES shortness of breath, swelling of tongue/throat, difficulty breathing, itching, etc.

## 2014-09-19 NOTE — ED Provider Notes (Signed)
CSN: [id number]     Arrival date & time 09/19/14  1338 History   First MD Initiated Contact with Patient 09/19/14 1445     Chief Complaint  Patient presents with  . Insect Bite     (Consider location/radiation/quality/duration/timing/severity/associated sxs/prior Treatment) HPI Aaron GUIFFRE is a 64 y.o. male who comes in for evaluation of insect bite. Patient states at approximately 11:00 this morning, while mowing, he was stung by a yellow jacket 3 times. He reports to stings to his right abdomen and one to his left upper arm. He reports fairly sudden onset nausea and vomiting. He reports 4 total episodes of emesis since 11. He also feels like his heart is beating faster. He denies any chest pain, shortness of breath, difficulties breathing, tongue or lip swelling, skin rash, abdominal pain or cramping. Reports he has been to the allergist before" has 30 different allergies". He has not been prescribed an EpiPen.  Past Medical History  Diagnosis Date  . Hyperlipidemia   . Personal history of urinary calculi   . Vertigo   . Diabetes mellitus ~2006   Past Surgical History  Procedure Laterality Date  . Traumatic injury and repair of rt hand  1974   Family History  Problem Relation Age of Onset  . Heart disease Mother     CAD/PCI with stents; cardiomyopathy with AICD  . Cancer Other     Colon and Prostate  . Colon cancer Neg Hx    Social History  Substance Use Topics  . Smoking status: Never Smoker   . Smokeless tobacco: Former Systems developer    Types: [place] date: 02/08/1995  . Alcohol Use: No    Review of Systems A 10 point review of systems was completed and was negative except for pertinent positives and negatives as mentioned in the history of present illness     Allergies  Pioglitazone  Home Medications   Prior to Admission medications   Medication Sig Start Date End Date Taking? Authorizing Provider  aspirin 81 MG tablet Take 81 mg by mouth daily.   Yes  Historical Provider, MD  Aspirin-Salicylamide-Caffeine (BC HEADACHE POWDER PO) Take 1 Package by mouth daily as needed (headache).   Yes Historical Provider, MD  cetirizine (ZYRTEC) 10 MG tablet Take 10 mg by mouth daily as needed for allergies.   Yes Historical Provider, MD  Insulin Glargine (LANTUS SOLOSTAR) 100 UNIT/ML Solostar Pen Inject 60 Units into the skin daily. Dx: E11.9 Patient taking differently: Inject 50 Units into the skin daily at 10 pm. Dx: E11.9 07/14/14  Yes Venia Carbon, MD  INVOKANA 300 MG TABS tablet TAKE ONE TABLET BY MOUTH EVERY MORNING 05/16/14  Yes Venia Carbon, MD  lisinopril (PRINIVIL,ZESTRIL) 10 MG tablet TAKE 1 TABLET BY MOUTH DAILY 01/20/14  Yes Venia Carbon, MD  metFORMIN (GLUCOPHAGE) 1000 MG tablet TAKE 1 TABLET BY MOUTH TWICE A DAY WITH A MEAL 05/05/14  Yes Venia Carbon, MD  simvastatin (ZOCOR) 20 MG tablet TAKE ONE TABLET BY MOUTH EVERY NIGHT AT BEDTIME 05/16/14  Yes Venia Carbon, MD  hydrOXYzine (ATARAX/VISTARIL) 25 MG tablet Take 1 tablet (25 mg total) by mouth 3 (three) times daily as needed. Patient not taking: Reported on 09/19/2014 07/14/14   Venia Carbon, MD  sildenafil (REVATIO) 20 MG tablet Take 3-5 tablets (60-100 mg total) by mouth daily as needed. Patient not taking: Reported on 09/19/2014 07/14/14   Venia Carbon, MD   BP 116/66  mmHg  Pulse 107  Temp(Src) 97.8 F (36.6 C) (Oral)  Resp 25  Ht 5' 10.5" (1.791 m)  Wt 230 lb (104.327 kg)  BMI 32.52 kg/m2  SpO2 92% Physical Exam  Constitutional: He is oriented to person, place, and time. He appears well-developed and well-nourished.  HENT:  Head: Normocephalic and atraumatic.  Mouth/Throat: Oropharynx is clear and moist.  Eyes: Conjunctivae are normal. Pupils are equal, round, and reactive to light. Right eye exhibits no discharge. Left eye exhibits no discharge. No scleral icterus.  Neck: Neck supple.  Cardiovascular: Regular rhythm and normal heart sounds.   Mild tachycardia  with normal heart sounds.  Pulmonary/Chest: Effort normal and breath sounds normal. No respiratory distress. He has no wheezes. He has no rales.  Abdominal: Soft. There is no tenderness.  Musculoskeletal: He exhibits no tenderness.  Neurological: He is alert and oriented to person, place, and time.  Cranial Nerves II-XII grossly intact  Skin: Skin is warm and dry. No rash noted.  One, small localized area of erythema to the right lateral abdomen consistent with insect sting. Similar lesion noted to posterior aspect of left upper extremity. No other diffuse rash or lesions noted.  Psychiatric: He has a normal mood and affect.  Nursing note and vitals reviewed.   ED Course  Procedures (including critical care time) Labs Review Labs Reviewed - No data to display  Imaging Review No results found. I, Verl Dicker, personally reviewed and evaluated these images and lab results as part of my medical decision-making.   EKG Interpretation None      Meds given in ED:  Medications  ondansetron (ZOFRAN-ODT) disintegrating tablet 4 mg (not administered)  dexamethasone (DECADRON) injection 10 mg (10 mg Intramuscular Given 09/19/14 1635)  famotidine (PEPCID) tablet 20 mg (20 mg Oral Given 09/19/14 1635)  diphenhydrAMINE (BENADRYL) capsule 50 mg (50 mg Oral Given 09/19/14 1635)  sodium chloride 0.9 % bolus 1,000 mL (1,000 mLs Intravenous New Bag/Given 09/19/14 1915)    New Prescriptions   No medications on file   Filed Vitals:   09/19/14 1900 09/19/14 1915 09/19/14 1930 09/19/14 1945  BP: 112/77 123/64 118/66 116/66  Pulse: 110 107 108 107  Temp:      TempSrc:      Resp: 33 19 29 25   Height:      Weight:      SpO2: 93% 93% 94% 92%    6:00--patient states he feels much better after administration of medications in the ED. MDM  Vitals stable - afebrile Mild tachycardia, however patient denies any chest pain, shortness of breath. No leg swelling. Low suspicion for any infectious  etiology. Pt resting comfortably in ED. PE--physical exam as above and not concerning for acute or emergent pathology. No evidence of anaphylaxis or other emergent allergic reaction.  Patient responds well to Decadron, Pepcid and Benadryl. Discussed follow-up with PCP/allergist for further evaluation and management of symptoms.   I discussed all relevant lab findings and imaging results with pt and they verbalized understanding. Discussed f/u with PCP within 48 hrs and return precautions, pt very amenable to plan. Prior to patient discharge, I discussed and reviewed this case with Dr.Zackowski  Final diagnoses:  Allergic reaction, initial encounter       Comer Locket, PA-C 09/19/14 2045  Fredia Sorrow, MD 09/24/14 [phone]

## 2014-09-19 NOTE — ED Notes (Signed)
Pt moved to room A01. Pt monitored by pulse ox, bp cuff, and 12-lead. Family at bedside.

## 2014-09-19 NOTE — Discharge Instructions (Signed)
There does not appear to be an emergent cause for your allergic reaction at this time. Please follow-up with your doctors as needed. Return to ED for new or worsening symptoms.  Allergies Allergies may happen from anything your body is sensitive to. This may be food, medicines, pollens, chemicals, and nearly anything around you in everyday life that produces allergens. An allergen is anything that causes an allergy producing substance. Heredity is often a factor in causing these problems. This means you may have some of the same allergies as your parents. Food allergies happen in all age groups. Food allergies are some of the most severe and life threatening. Some common food allergies are cow's milk, seafood, eggs, nuts, wheat, and soybeans. SYMPTOMS   Swelling around the mouth.  An itchy red rash or hives.  Vomiting or diarrhea.  Difficulty breathing. SEVERE ALLERGIC REACTIONS ARE LIFE-THREATENING. This reaction is called anaphylaxis. It can cause the mouth and throat to swell and cause difficulty with breathing and swallowing. In severe reactions only a trace amount of food (for example, peanut oil in a salad) may cause death within seconds. Seasonal allergies occur in all age groups. These are seasonal because they usually occur during the same season every year. They may be a reaction to molds, grass pollens, or tree pollens. Other causes of problems are house dust mite allergens, pet dander, and mold spores. The symptoms often consist of nasal congestion, a runny itchy nose associated with sneezing, and tearing itchy eyes. There is often an associated itching of the mouth and ears. The problems happen when you come in contact with pollens and other allergens. Allergens are the particles in the air that the body reacts to with an allergic reaction. This causes you to release allergic antibodies. Through a chain of events, these eventually cause you to release histamine into the blood stream.  Although it is meant to be protective to the body, it is this release that causes your discomfort. This is why you were given anti-histamines to feel better. If you are unable to pinpoint the offending allergen, it may be determined by skin or blood testing. Allergies cannot be cured but can be controlled with medicine. Hay fever is a collection of all or some of the seasonal allergy problems. It may often be treated with simple over-the-counter medicine such as diphenhydramine. Take medicine as directed. Do not drink alcohol or drive while taking this medicine. Check with your caregiver or package insert for child dosages. If these medicines are not effective, there are many new medicines your caregiver can prescribe. Stronger medicine such as nasal spray, eye drops, and corticosteroids may be used if the first things you try do not work well. Other treatments such as immunotherapy or desensitizing injections can be used if all else fails. Follow up with your caregiver if problems continue. These seasonal allergies are usually not life threatening. They are generally more of a nuisance that can often be handled using medicine. HOME CARE INSTRUCTIONS   If unsure what causes a reaction, keep a diary of foods eaten and symptoms that follow. Avoid foods that cause reactions.  If hives or rash are present:  Take medicine as directed.  You may use an over-the-counter antihistamine (diphenhydramine) for hives and itching as needed.  Apply cold compresses (cloths) to the skin or take baths in cool water. Avoid hot baths or showers. Heat will make a rash and itching worse.  If you are severely allergic:  Following a treatment for a  severe reaction, hospitalization is often required for closer follow-up.  Wear a medic-alert bracelet or necklace stating the allergy.  You and your family must learn how to give adrenaline or use an anaphylaxis kit.  If you have had a severe reaction, always carry your  anaphylaxis kit or EpiPen with you. Use this medicine as directed by your caregiver if a severe reaction is occurring. Failure to do so could have a fatal outcome. SEEK MEDICAL CARE IF:  You suspect a food allergy. Symptoms generally happen within 30 minutes of eating a food.  Your symptoms have not gone away within 2 days or are getting worse.  You develop new symptoms.  You want to retest yourself or your child with a food or drink you think causes an allergic reaction. Never do this if an anaphylactic reaction to that food or drink has happened before. Only do this under the care of a caregiver. SEEK IMMEDIATE MEDICAL CARE IF:   You have difficulty breathing, are wheezing, or have a tight feeling in your chest or throat.  You have a swollen mouth, or you have hives, swelling, or itching all over your body.  You have had a severe reaction that has responded to your anaphylaxis kit or an EpiPen. These reactions may return when the medicine has worn off. These reactions should be considered life threatening. MAKE SURE YOU:   Understand these instructions.  Will watch your condition.  Will get help right away if you are not doing well or get worse. Document Released: 04/19/2002 Document Revised: 05/21/2012 Document Reviewed: 09/24/2007 [facility] Patient Information 2015 [place], [place]. This information is not intended to replace advice given to you by your health care provider. Make sure you discuss any questions you have with your health care provider.

## 2014-09-19 NOTE — ED Notes (Signed)
Patient states was bitten/stung by "it was probably yellow jackets" today around 1130.   Patient states since that time he has had N/V, chills, headache.   Patient does have raised areas to L arm and R abdomen.

## 2014-09-22 ENCOUNTER — Encounter: Payer: Self-pay | Admitting: *Deleted

## 2014-09-22 ENCOUNTER — Encounter: Payer: Self-pay | Admitting: Internal Medicine

## 2014-09-22 ENCOUNTER — Ambulatory Visit (INDEPENDENT_AMBULATORY_CARE_PROVIDER_SITE_OTHER): Payer: BC Managed Care – PPO | Admitting: Internal Medicine

## 2014-09-22 ENCOUNTER — Telehealth: Payer: Self-pay

## 2014-09-22 VITALS — BP 120/70 | HR 95 | Temp 98.1°F | Wt 218.0 lb

## 2014-09-22 DIAGNOSIS — T675XXA Heat exhaustion, unspecified, initial encounter: Secondary | ICD-10-CM | POA: Diagnosis not present

## 2014-09-22 NOTE — Assessment & Plan Note (Signed)
Hasn't happened before--may have had cofactor of yellow jacket bites (though no evidence of separate severe reaction since he kept working for another 1.5 hours) Still washed out and no energy Will give him out of work note till 8/17 Keep rehydrating Try to advance diet Check sugars--may not be low just because he is not eating much (recent change to lantus has his sugars down anyway--he had to cut to 50 units)

## 2014-09-22 NOTE — Progress Notes (Signed)
Pre visit review using our clinic review tool, if applicable. No additional management support is needed unless otherwise documented below in the visit note. 

## 2014-09-22 NOTE — Telephone Encounter (Signed)
Seen in ER and discharged Check on him today Doesn't really sound like anaphylaxis but we may want to do a blood test to see if he has severe allergy to stinging insects

## 2014-09-22 NOTE — Telephone Encounter (Signed)
Pt seen [name] on 09/19/14 and has appt with Dr Silvio Pate 09/22/14 at 2 pm.

## 2014-09-22 NOTE — Telephone Encounter (Signed)
No need to call him then I will discuss at [place]

## 2014-09-22 NOTE — Progress Notes (Signed)
Subjective:    Patient ID: Aaron Zimmerman, male    DOB: [birth year], 64 y.o.   MRN: [id number]  HPI Here for ER follow up Wife is here  Was working out in 2 acre field--mowing on tractor Yellow jackets came up and bit him--3 bites  Kept going for another hour and a half--- started getting nauseated then Uncontrolled vomiting so went to ER Notes reviewed  Felt better for the most part but didn't resolve Some low grade fever-- 101 -- 8/13 evening Abdominal pain this night also Would break out in sweat and feel bad in the sun  Has been drinking okay Appetite is off---not having anything of substance Some regular food today--but not back to normal  Current Outpatient Prescriptions on File Prior to Visit  Medication Sig Dispense Refill  . aspirin 81 MG tablet Take 81 mg by mouth daily.    . Aspirin-Salicylamide-Caffeine (BC HEADACHE POWDER PO) Take 1 Package by mouth daily as needed (headache).    . cetirizine (ZYRTEC) 10 MG tablet Take 10 mg by mouth daily as needed for allergies.    . INVOKANA 300 MG TABS tablet TAKE ONE TABLET BY MOUTH EVERY MORNING 90 tablet 3  . lisinopril (PRINIVIL,ZESTRIL) 10 MG tablet TAKE 1 TABLET BY MOUTH DAILY 90 tablet 3  . metFORMIN (GLUCOPHAGE) 1000 MG tablet TAKE 1 TABLET BY MOUTH TWICE A DAY WITH A MEAL 60 tablet 11  . simvastatin (ZOCOR) 20 MG tablet TAKE ONE TABLET BY MOUTH EVERY NIGHT AT BEDTIME 90 tablet 3   No current facility-administered medications on file prior to visit.    Allergies  Allergen Reactions  . Pioglitazone Rash    Past Medical History  Diagnosis Date  . Hyperlipidemia   . Personal history of urinary calculi   . Vertigo   . Diabetes mellitus ~2006    Past Surgical History  Procedure Laterality Date  . Traumatic injury and repair of rt hand  1974    Family History  Problem Relation Age of Onset  . Heart disease Mother     CAD/PCI with stents; cardiomyopathy with AICD  . Cancer Other     Colon and Prostate  .  Colon cancer Neg Hx     Social History   Social History  . Marital Status: Married    Spouse Name: N/A  . Number of Children: 2  . Years of Education: N/A   Occupational History  . 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 Smoking status: Never Smoker   . Smokeless tobacco: Former Systems developer    Types: [place] date: 02/08/1995  . Alcohol Use: No  . Drug Use: No  . Sexual Activity: Yes   Other Topics Concern  . Not on file   Social History Narrative   HSG   Married 1979   1 son 5 1 daughter 22; 1 granddaughter Eliezer Lofts '07), 1 grandson (Seth '09)   Work: [employer]   Regular exercise: walking when he can   Caffeine use: coffee all day   Review of Systems No cough or SOB No facial, lip or mouth swelling Has held his insulin since ER since he isn't eating much    Objective:   Physical Exam  Constitutional: He appears well-developed and well-nourished. No distress.  HENT:  Mouth/Throat: Oropharynx is clear and moist. No oropharyngeal exudate.  Neck: Normal range of motion. Neck supple. No thyromegaly present.  Cardiovascular: Normal rate, regular  rhythm and normal heart sounds.  Exam reveals no gallop.   No murmur heard. Pulmonary/Chest: Effort normal and breath sounds normal. No respiratory distress. He has no wheezes. He has no rales.  Abdominal: Soft. There is no tenderness.  Lymphadenopathy:    He has no cervical adenopathy.  Skin:  Cool and clammy          Assessment & Plan:

## 2015-01-28 ENCOUNTER — Other Ambulatory Visit: Payer: Self-pay | Admitting: Internal Medicine

## 2015-02-04 ENCOUNTER — Encounter: Payer: BC Managed Care – PPO | Admitting: Internal Medicine

## 2015-02-09 ENCOUNTER — Other Ambulatory Visit: Payer: Self-pay | Admitting: Internal Medicine

## 2015-02-12 ENCOUNTER — Telehealth: Payer: Self-pay | Admitting: *Deleted

## 2015-02-12 NOTE — Telephone Encounter (Signed)
Received fax from pharmacy that [facility] is no longer on pt's formulary and no longer covered. FARXIGA 5 mg or 10 mg is covered or JARDIANCE 10 mg or 25 mg is covered. Please advise

## 2015-02-16 MED ORDER — DAPAGLIFLOZIN PROPANEDIOL 10 MG PO TABS: 10.0000 mg | ORAL_TABLET | Freq: Every day | ORAL | Status: DC

## 2015-02-16 NOTE — Telephone Encounter (Signed)
Spoke with patient's wife and advised results, she will have him call if there are any problems

## 2015-02-16 NOTE — Telephone Encounter (Signed)
Okay to change to farxiga 10mg ---let him know this is fairly comparable to the invokana. Have him let us know if he has any problems with the change

## 2015-02-25 ENCOUNTER — Encounter: Payer: Self-pay | Admitting: Internal Medicine

## 2015-03-20 ENCOUNTER — Telehealth: Payer: Self-pay

## 2015-03-20 NOTE — Telephone Encounter (Signed)
Aaron Zimmerman from [facility] left v/m; pt's ins had pt change to different insulin; basaglar. pt struggling with unexplained high BS readings. Want to know if Dr Silvio Pate would approve continuous glucose monitoring. Aaron Zimmerman understands Dr Silvio Pate is out of office today and will get cb next week.

## 2015-03-21 NOTE — Telephone Encounter (Signed)
I heard about the change to farxiga --but not a different insulin I need to get more information about basaglar---never used it before Likely he just needs to adjust the doses upward I don't see a reason for continuous glucose monitoring He should just check twice a day and vary the times for the next week or so---then set up appt and we can review

## 2015-03-24 NOTE — Telephone Encounter (Signed)
Amy from [facility] left message to check status of this request from 2/10.  Please call back at [phone].

## 2015-03-24 NOTE — Telephone Encounter (Signed)
Spoke with patient and amy at [facility], pt has appt on 04/09/2015 and that per Dr. Silvio Pate is ok to wait until then.

## 2015-04-09 ENCOUNTER — Encounter: Payer: Self-pay | Admitting: Internal Medicine

## 2015-04-09 ENCOUNTER — Ambulatory Visit (INDEPENDENT_AMBULATORY_CARE_PROVIDER_SITE_OTHER): Payer: BC Managed Care – PPO | Admitting: Internal Medicine

## 2015-04-09 ENCOUNTER — Other Ambulatory Visit: Payer: Self-pay | Admitting: Internal Medicine

## 2015-04-09 VITALS — BP 126/66 | HR 85 | Temp 98.0°F | Ht 71.0 in | Wt 232.5 lb

## 2015-04-09 DIAGNOSIS — Z0000 Encounter for general adult medical examination without abnormal findings: Secondary | ICD-10-CM

## 2015-04-09 DIAGNOSIS — Z23 Encounter for immunization: Secondary | ICD-10-CM | POA: Diagnosis not present

## 2015-04-09 DIAGNOSIS — G008 Other bacterial meningitis: Secondary | ICD-10-CM

## 2015-04-09 DIAGNOSIS — E785 Hyperlipidemia, unspecified: Secondary | ICD-10-CM | POA: Diagnosis not present

## 2015-04-09 DIAGNOSIS — E1165 Type 2 diabetes mellitus with hyperglycemia: Secondary | ICD-10-CM

## 2015-04-09 DIAGNOSIS — R0789 Other chest pain: Secondary | ICD-10-CM

## 2015-04-09 DIAGNOSIS — Z125 Encounter for screening for malignant neoplasm of prostate: Secondary | ICD-10-CM

## 2015-04-09 DIAGNOSIS — Z794 Long term (current) use of insulin: Secondary | ICD-10-CM | POA: Diagnosis not present

## 2015-04-09 DIAGNOSIS — IMO0001 Reserved for inherently not codable concepts without codable children: Secondary | ICD-10-CM

## 2015-04-09 LAB — HM DIABETES FOOT EXAM

## 2015-04-09 NOTE — Assessment & Plan Note (Signed)
Inconsistent and doesn't feel well since change from lantus He will check with insurance about getting lantus again For now, split the other into 30 bid

## 2015-04-09 NOTE — Progress Notes (Signed)
Subjective:    Patient ID: Aaron Zimmerman, male    DOB: [birth year], 65 y.o.   MRN: [id number]  HPI Here for physical  Only doing "fair" Not doing well since the change to the generic insulin AM sugars highly variable--- as long as 75 and then up to 280 Had 2-3 weeks of tingling in his feet at first-- when sugars at 300. Now this is gone Was on 40 units of lantus--- has had to increase to 60 units of this new insulin Knows he had eye exam in past year--he will try to get records  Libido is some better Has worked things out with his wife  Mom died 2 weeks ago Had to go to nursing home Lungs "filled up with blood"  Current Outpatient Prescriptions on File Prior to Visit  Medication Sig Dispense Refill  . aspirin 81 MG tablet Take 81 mg by mouth daily.    . Aspirin-Salicylamide-Caffeine (BC HEADACHE POWDER PO) Take 1 Package by mouth daily as needed (headache).    . cetirizine (ZYRTEC) 10 MG tablet Take 10 mg by mouth daily as needed for allergies.    . dapagliflozin propanediol (FARXIGA) 10 MG TABS tablet Take 10 mg by mouth daily. 90 tablet 3  . lisinopril (PRINIVIL,ZESTRIL) 10 MG tablet TAKE 1 TABLET BY MOUTH DAILY 90 tablet 0  . metFORMIN (GLUCOPHAGE) 1000 MG tablet TAKE 1 TABLET BY MOUTH TWICE A DAY WITH A MEAL 60 tablet 11  . simvastatin (ZOCOR) 20 MG tablet TAKE ONE TABLET BY MOUTH EVERY NIGHT AT BEDTIME 90 tablet 3   No current facility-administered medications on file prior to visit.    Allergies  Allergen Reactions  . Pioglitazone Rash    Past Medical History  Diagnosis Date  . Hyperlipidemia   . Personal history of urinary calculi   . Vertigo   . Diabetes mellitus ~2006    Past Surgical History  Procedure Laterality Date  . Traumatic injury and repair of rt hand  1974    Family History  Problem Relation Age of Onset  . Heart disease Mother     CAD/PCI with stents; cardiomyopathy with AICD  . Cancer Other     Colon and Prostate  . Colon cancer Neg Hx      Social History   Social History  . Marital Status: Married    Spouse Name: N/A  . Number of Children: 2  . Years of Education: N/A   Occupational History  . 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 Smoking status: Never Smoker   . Smokeless tobacco: Former Systems developer    Types: [place] date: 02/08/1995  . Alcohol Use: No  . Drug Use: No  . Sexual Activity: Yes   Other Topics Concern  . Not on file   Social History Narrative   HSG   Married 1979   1 son 48 1 daughter 16; 1 granddaughter Eliezer Lofts '07), 1 grandson (Seth '09)   Work: [employer]   Regular exercise: walking when he can   Caffeine use: coffee all day   Review of Systems  Constitutional: Positive for unexpected weight change. Negative for fatigue.       Stress with mom's death and uncle's death Eating more with the stress Wears seat belt  HENT: Negative for dental problem, hearing loss and tinnitus.        Keeps up with dentist  Eyes: Negative for visual disturbance.  No diplopia or unilateral vision loss  Respiratory: Negative for cough, chest tightness and shortness of breath.   Cardiovascular: Negative for palpitations and leg swelling.       Vague chest feeling---"something different"  Gastrointestinal: Negative for nausea, vomiting, abdominal pain and blood in stool.       Occ heartburn with spicy foods-- tums helps  Endocrine: Positive for polyuria. Negative for polydipsia.  Genitourinary: Positive for urgency and frequency. Negative for difficulty urinating.  Musculoskeletal: Negative for back pain, joint swelling and arthralgias.  Skin: Negative for rash.       No suspicious lesions  Allergic/Immunologic: Positive for environmental allergies. Negative for immunocompromised state.  Neurological: Positive for headaches. Negative for dizziness, syncope, weakness and light-headedness.  Hematological: Negative for adenopathy.  Bruises/bleeds easily.  Psychiatric/Behavioral: Negative for sleep disturbance and dysphoric mood. The patient is not nervous/anxious.        Objective:   Physical Exam  Constitutional: He appears well-developed and well-nourished. No distress.  HENT:  Head: Normocephalic and atraumatic.  Right Ear: External ear normal.  Left Ear: External ear normal.  Mouth/Throat: Oropharynx is clear and moist. No oropharyngeal exudate.  Eyes: Conjunctivae are normal. Pupils are equal, round, and reactive to light.  Neck: Normal range of motion. Neck supple. No thyromegaly present.  Cardiovascular: Normal rate, regular rhythm, normal heart sounds and intact distal pulses.  Exam reveals no gallop.   No murmur heard. Pulmonary/Chest: Effort normal and breath sounds normal. No respiratory distress. He has no wheezes. He has no rales.  Abdominal: Soft. There is no tenderness.  Musculoskeletal: He exhibits no edema or tenderness.  Lymphadenopathy:    He has no cervical adenopathy.  Neurological:  Normal sensation in feet  Skin: No rash noted. No erythema.  No foot lesions  Psychiatric: He has a normal mood and affect. His behavior is normal.          Assessment & Plan:

## 2015-04-09 NOTE — Assessment & Plan Note (Signed)
No problems with statin 

## 2015-04-09 NOTE — Progress Notes (Signed)
Pre visit review using our clinic review tool, if applicable. No additional management support is needed unless otherwise documented below in the visit note. 

## 2015-04-09 NOTE — Assessment & Plan Note (Signed)
Colon due again 2020 Discussed PSA ---will check No zostavax--never had varicella

## 2015-04-09 NOTE — Addendum Note (Signed)
Addended by: Pilar Grammes on: 04/09/2015 05:01 PM   Modules accepted: Orders

## 2015-04-09 NOTE — Assessment & Plan Note (Signed)
Vague and doesn't sound ischemic--but checked EKG Discussed that [facility] and start regular exercise

## 2015-04-10 LAB — LIPID PANEL
CHOLESTEROL: 119 mg/dL (ref 0–200)
HDL: 32.2 mg/dL — ABNORMAL LOW (ref >39.00)
LDL Cholesterol: 49 mg/dL (ref 0–99)
NonHDL: 87.09
Total CHOL/HDL Ratio: 4
Triglycerides: 191 mg/dL — ABNORMAL HIGH (ref 0.0–149.0)
VLDL: 38.2 mg/dL (ref 0.0–40.0)

## 2015-04-10 LAB — CBC WITH DIFFERENTIAL/PLATELET
Basophils Absolute: 0 10*3/uL (ref 0.0–0.1)
Basophils Relative: 0.4 % (ref 0.0–3.0)
Eosinophils Absolute: 0.1 10*3/uL (ref 0.0–0.7)
Eosinophils Relative: 0.9 % (ref 0.0–5.0)
HCT: 48 % (ref 39.0–52.0)
Hemoglobin: 16.2 g/dL (ref 13.0–17.0)
Lymphocytes Relative: 25.7 % (ref 12.0–46.0)
Lymphs Abs: 2.2 10*3/uL (ref 0.7–4.0)
MCHC: 33.8 g/dL (ref 30.0–36.0)
MCV: 85.4 fl (ref 78.0–100.0)
Monocytes Absolute: 0.6 10*3/uL (ref 0.1–1.0)
Monocytes Relative: 7.3 % (ref 3.0–12.0)
NEUTROS ABS: 5.5 10*3/uL (ref 1.4–7.7)
Neutrophils Relative %: 65.7 % (ref 43.0–77.0)
PLATELETS: 227 10*3/uL (ref 150.0–400.0)
RBC: 5.62 Mil/uL (ref 4.22–5.81)
RDW: 13 % (ref 11.5–15.5)
WBC: 8.4 10*3/uL (ref 4.0–10.5)

## 2015-04-10 LAB — COMPREHENSIVE METABOLIC PANEL
ALT: 19 U/L (ref 0–53)
AST: 16 U/L (ref 0–37)
Albumin: 4.8 g/dL (ref 3.5–5.2)
Alkaline Phosphatase: 52 U/L (ref 39–117)
BUN: 17 mg/dL (ref 6–23)
CO2: 30 mEq/L (ref 19–32)
Calcium: 9.9 mg/dL (ref 8.4–10.5)
Chloride: 101 mEq/L (ref 96–112)
Creatinine, Ser: 1.06 mg/dL (ref 0.40–1.50)
GFR: 75.14 mL/min (ref >60.00)
Glucose, Bld: 120 mg/dL — ABNORMAL HIGH (ref 70–99)
Potassium: 4.3 mEq/L (ref 3.5–5.1)
Sodium: 139 mEq/L (ref 135–145)
Total Bilirubin: 0.7 mg/dL (ref 0.2–1.2)
Total Protein: 7.1 g/dL (ref 6.0–8.3)

## 2015-04-10 LAB — HEMOGLOBIN A1C: Hgb A1c MFr Bld: 8 % — ABNORMAL HIGH (ref 4.6–6.5)

## 2015-04-10 LAB — PSA: PSA: 3 ng/mL (ref 0.10–4.00)

## 2015-04-10 LAB — T4, FREE: Free T4: 0.95 ng/dL (ref 0.60–1.60)

## 2015-04-10 NOTE — Telephone Encounter (Signed)
Spoke to Bea at the pharmacy, who states that the medications are equivalent but that pt may be responding differently. She also states that a PA will be required. New Lantus Rx sent to pharmacy and Bea will send kickback for PA to be completed on today.

## 2015-04-10 NOTE — Telephone Encounter (Signed)
PA completed online. Approval received and faxed back to [facility]. Medication approved through 04-09-16

## 2015-04-10 NOTE — Telephone Encounter (Signed)
Spoke to [employer] at [facility] in regards to him being changed to [employer] from Lantus. She said it is the exact same medication as Lantus, just made by a different manufacturer. If you are okay with him continuing on the generic, please do a new rx. Bee was questioning if he was supposed to be splitting his 60 unit dose to 30 in am and 30 in pm, per patient after yesterday's visit.

## 2015-04-10 NOTE — Telephone Encounter (Signed)
He has done very poorly on the basaglar and it is not equivalent. If possible, he should go back on lantus (which he took only 40 units daily of) I told him to split the dose in hopes it would decrease the tremendous variability in his sugars

## 2015-04-13 ENCOUNTER — Encounter: Payer: Self-pay | Admitting: Internal Medicine

## 2015-04-13 ENCOUNTER — Ambulatory Visit (INDEPENDENT_AMBULATORY_CARE_PROVIDER_SITE_OTHER): Payer: BC Managed Care – PPO | Admitting: Internal Medicine

## 2015-04-13 VITALS — BP 112/70 | HR 99 | Temp 98.2°F | Resp 16 | Wt 226.0 lb

## 2015-04-13 DIAGNOSIS — B9789 Other viral agents as the cause of diseases classified elsewhere: Secondary | ICD-10-CM

## 2015-04-13 DIAGNOSIS — Z794 Long term (current) use of insulin: Secondary | ICD-10-CM

## 2015-04-13 DIAGNOSIS — E1165 Type 2 diabetes mellitus with hyperglycemia: Secondary | ICD-10-CM

## 2015-04-13 DIAGNOSIS — J069 Acute upper respiratory infection, unspecified: Secondary | ICD-10-CM | POA: Insufficient documentation

## 2015-04-13 DIAGNOSIS — IMO0001 Reserved for inherently not codable concepts without codable children: Secondary | ICD-10-CM

## 2015-04-13 NOTE — Assessment & Plan Note (Signed)
Was able to get back on the lantus

## 2015-04-13 NOTE — Assessment & Plan Note (Signed)
Some sinus tenderness but seems to just be self limited viral illness Discussed supportive care If worsens next week, would try amoxicillin

## 2015-04-13 NOTE — Progress Notes (Signed)
Subjective:    Patient ID: Aaron Zimmerman, male    DOB: August 15, 1950, 65 y.o.   MRN: [id number]  HPI Here due to respiratory infection Started 2 days ago Low grade fever Aches and pains, cold feeling--then hot Having a hard time sleeping--- awakening every 30 minutes  Nose is congested Not much cough No SOB Intermittent sore throat---tried gargling with salt water No ear pain  Tried aspirin for fever Vitamin C also  Current Outpatient Prescriptions on File Prior to Visit  Medication Sig Dispense Refill  . aspirin 81 MG tablet Take 81 mg by mouth daily.    . Aspirin-Salicylamide-Caffeine (BC HEADACHE POWDER PO) Take 1 Package by mouth daily as needed (headache).    . cetirizine (ZYRTEC) 10 MG tablet Take 10 mg by mouth daily as needed for allergies.    . dapagliflozin propanediol (FARXIGA) 10 MG TABS tablet Take 10 mg by mouth daily. 90 tablet 3  . Insulin Glargine (LANTUS SOLOSTAR) 100 UNIT/ML Solostar Pen Inject 40 Units into the skin daily. 15 mL 1  . lisinopril (PRINIVIL,ZESTRIL) 10 MG tablet TAKE 1 TABLET BY MOUTH DAILY 90 tablet 0  . metFORMIN (GLUCOPHAGE) 1000 MG tablet TAKE 1 TABLET BY MOUTH TWICE A DAY WITH A MEAL 60 tablet 11  . simvastatin (ZOCOR) 20 MG tablet TAKE ONE TABLET BY MOUTH EVERY NIGHT AT BEDTIME 90 tablet 3   No current facility-administered medications on file prior to visit.    Allergies  Allergen Reactions  . Pioglitazone Rash    Past Medical History  Diagnosis Date  . Hyperlipidemia   . Personal history of urinary calculi   . Vertigo   . Diabetes mellitus ~2006    Past Surgical History  Procedure Laterality Date  . Traumatic injury and repair of rt hand  1974    Family History  Problem Relation Age of Onset  . Heart disease Mother     CAD/PCI with stents; cardiomyopathy with AICD  . Cancer Other     Colon and Prostate  . Colon cancer Neg Hx     Social History   Social History  . Marital Status: Married    Spouse Name: N/A  .  Number of Children: 2  . Years of Education: N/A   Occupational History  . 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 Smoking status: Never Smoker   . Smokeless tobacco: Former Systems developer    Types: [place] date: 02/08/1995  . Alcohol Use: No  . Drug Use: No  . Sexual Activity: Yes   Other Topics Concern  . Not on file   Social History Narrative   HSG   Married 1979   1 son 21 1 daughter 61; 1 granddaughter Eliezer Lofts '07), 1 grandson (Seth '09)   Work: [employer]   Regular exercise: walking when he can   Caffeine use: coffee all day   Review of Systems  No rash No vomiting or diarrhea Not much appetite--has to make himself eat     Objective:   Physical Exam  Constitutional: He appears well-developed. No distress.  HENT:  Mild maxillary tenderness TMs normal Moderate nasal inflammation Mild pharyngeal injection without exudate  Neck: Normal range of motion. Neck supple.  Pulmonary/Chest: Effort normal and breath sounds normal. No respiratory distress. He has no wheezes. He has no rales.  Lymphadenopathy:    He has no cervical adenopathy.  Assessment & Plan:

## 2015-04-13 NOTE — Progress Notes (Signed)
Pre visit review using our clinic review tool, if applicable. No additional management support is needed unless otherwise documented below in the visit note. 

## 2015-04-14 ENCOUNTER — Telehealth: Payer: Self-pay

## 2015-04-14 NOTE — Telephone Encounter (Signed)
Mrs Aaron Zimmerman left v/m;pt was seen 04/13/15 pt is so tired and no energy; pt is not going to be able to work on 04/15/15 and request work note for 04/15/15; Mrs Aaron Zimmerman also wants to know what decongestant pt can take due to pt being diabetic. Mrs Aaron Zimmerman request cb.

## 2015-04-15 NOTE — Telephone Encounter (Signed)
Okay to write note for 3/8 I am not a fan of decongestants--they can have a lot of side effects. If he wants to try one, he can try pseudoephedrine in low dose--- you have to ask for it. There are others available on the shelves that just say nasal decongestant--but if they don't markedly help fairly soon after taking, should not be continued

## 2015-04-15 NOTE — Telephone Encounter (Signed)
Spoke to wife, Vaughan Basta, per [place]. Advised her of the decongestants and that the work note was up front to pick up.

## 2015-04-17 ENCOUNTER — Telehealth: Payer: Self-pay

## 2015-04-17 ENCOUNTER — Other Ambulatory Visit: Payer: Self-pay | Admitting: Internal Medicine

## 2015-04-17 MED ORDER — AMOXICILLIN 500 MG PO CAPS: 500.0000 mg | ORAL_CAPSULE | Freq: Three times a day (TID) | ORAL | Status: DC

## 2015-04-17 NOTE — Telephone Encounter (Signed)
Pt is aware.  

## 2015-04-17 NOTE — Telephone Encounter (Signed)
Amoxil sent to [facility]

## 2015-04-17 NOTE — Telephone Encounter (Signed)
Left message on cell for patient to call back. I also called [facility] and spoke to [place]. She said they ran it through and it was still $900 because it is a non-formulary drug. She already spoke to the patient.

## 2015-04-17 NOTE — Telephone Encounter (Signed)
Pt was seen 04/13/15 and was advised if worsened to cb for amoxicillin; pt request abx to [facility]; symptoms are not worse but not a lot better; lt side of face has pressure feeling, head congestion, loss of energy, prod cough with yellow phlegm and when blows nose the mucus is yellow. No fever today.pt request cb. Dr Silvio Pate out of office.

## 2015-04-27 ENCOUNTER — Other Ambulatory Visit: Payer: Self-pay | Admitting: Internal Medicine

## 2015-05-19 ENCOUNTER — Other Ambulatory Visit: Payer: Self-pay | Admitting: Internal Medicine

## 2015-07-03 ENCOUNTER — Other Ambulatory Visit: Payer: Self-pay | Admitting: Internal Medicine

## 2015-08-06 ENCOUNTER — Other Ambulatory Visit: Payer: Self-pay | Admitting: Internal Medicine

## 2015-09-03 ENCOUNTER — Telehealth: Payer: Self-pay

## 2015-09-03 NOTE — Telephone Encounter (Signed)
Aaron Zimmerman wants to know when next appt is with Dr Silvio Pate. Advised per [facility] 10/15/15 at 3:45 with Dr Silvio Pate. [name] voiced understanding.

## 2015-09-05 NOTE — Telephone Encounter (Signed)
Make sure he doesn't need to be sooner--was she just confirming date?

## 2015-09-07 NOTE — Telephone Encounter (Signed)
Spoke to wife. She was just wanted to confirm

## 2015-10-15 ENCOUNTER — Encounter: Payer: Self-pay | Admitting: Internal Medicine

## 2015-10-15 ENCOUNTER — Ambulatory Visit (INDEPENDENT_AMBULATORY_CARE_PROVIDER_SITE_OTHER): Payer: BC Managed Care – PPO | Admitting: Internal Medicine

## 2015-10-15 VITALS — BP 122/74 | HR 74 | Temp 98.0°F | Wt 227.0 lb

## 2015-10-15 DIAGNOSIS — E0859 Diabetes mellitus due to underlying condition with other circulatory complications: Secondary | ICD-10-CM

## 2015-10-15 DIAGNOSIS — Z794 Long term (current) use of insulin: Secondary | ICD-10-CM | POA: Diagnosis not present

## 2015-10-15 DIAGNOSIS — Z23 Encounter for immunization: Secondary | ICD-10-CM | POA: Diagnosis not present

## 2015-10-15 DIAGNOSIS — E538 Deficiency of other specified B group vitamins: Secondary | ICD-10-CM | POA: Diagnosis not present

## 2015-10-15 DIAGNOSIS — E1169 Type 2 diabetes mellitus with other specified complication: Secondary | ICD-10-CM | POA: Diagnosis not present

## 2015-10-15 DIAGNOSIS — N521 Erectile dysfunction due to diseases classified elsewhere: Secondary | ICD-10-CM

## 2015-10-15 MED ORDER — SILDENAFIL CITRATE 20 MG PO TABS: 20.0000 mg | ORAL_TABLET | Freq: Three times a day (TID) | ORAL | 11 refills | Status: DC

## 2015-10-15 NOTE — Assessment & Plan Note (Signed)
Hopefully control still acceptable Continue brand lantus for now Check A1c

## 2015-10-15 NOTE — Progress Notes (Signed)
Pre visit review using our clinic review tool, if applicable. No additional management support is needed unless otherwise documented below in the visit note. 

## 2015-10-15 NOTE — Patient Instructions (Signed)
Please set up your diabetic eye exam. 

## 2015-10-15 NOTE — Addendum Note (Signed)
Addended by: Pilar Grammes on: 10/15/2015 04:43 PM   Modules accepted: Orders

## 2015-10-15 NOTE — Assessment & Plan Note (Signed)
Discussed--no reason to recheck testosterone

## 2015-10-15 NOTE — Assessment & Plan Note (Signed)
Will recheck  Takes supplement intermittently

## 2015-10-15 NOTE — Progress Notes (Signed)
Subjective:    Patient ID: Aaron Zimmerman, male    DOB: 08-31-1950, 65 y.o.   MRN: [id number]  HPI Here for follow up of diabetes Went back on brand lantus--- able to cut back to 40 units again Sugars better--checks every other day or so 130-145 fasting No sig hypoglycemic reactions--will occ get 85-90 (just brief feeling funny) No sores, pain or numbness (had trouble when on the generic lantus)  Trying to walk more--wife had bariatric surgery Tries to keep up with proper eating--does like plums  Current Outpatient Prescriptions on File Prior to Visit  Medication Sig Dispense Refill  . aspirin 81 MG tablet Take 81 mg by mouth daily.    . Aspirin-Salicylamide-Caffeine (BC HEADACHE POWDER PO) Take 1 Package by mouth daily as needed (headache).    . cetirizine (ZYRTEC) 10 MG tablet Take 10 mg by mouth daily as needed for allergies.    . dapagliflozin propanediol (FARXIGA) 10 MG TABS tablet Take 10 mg by mouth daily. 90 tablet 3  . LANTUS SOLOSTAR 100 UNIT/ML Solostar Pen INJECT 40 UNITS INTO THE SKIN DAILY 15 mL 5  . lisinopril (PRINIVIL,ZESTRIL) 10 MG tablet TAKE 1 TABLET BY MOUTH DAILY 90 tablet 1  . metFORMIN (GLUCOPHAGE) 1000 MG tablet TAKE 1 TABLET BY MOUTH TWICE A DAY WITH A MEAL 60 tablet 11  . simvastatin (ZOCOR) 20 MG tablet TAKE ONE (1) TABLET BY MOUTH EVERY NIGHTAT BEDTIME 90 tablet 3   No current facility-administered medications on file prior to visit.     Allergies  Allergen Reactions  . Pioglitazone Rash    Past Medical History:  Diagnosis Date  . Diabetes mellitus ~2006  . Hyperlipidemia   . Personal history of urinary calculi   . Vertigo     Past Surgical History:  Procedure Laterality Date  . traumatic injury and repair of RT hand  1974    Family History  Problem Relation Age of Onset  . Heart disease Mother     CAD/PCI with stents; cardiomyopathy with AICD  . Cancer Other     Colon and Prostate  . Colon cancer Neg Hx     Social History    Social History  . Marital status: Married    Spouse name: N/A  . Number of children: 2  . Years of education: N/A   Occupational History  . 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 Smoking status: Never Smoker  . Smokeless tobacco: Former Systems developer    Types: Chew    Quit date: 02/08/1995  . Alcohol use No  . Drug use: No  . Sexual activity: Yes   Other Topics Concern  . Not on file   Social History Narrative   HSG   Married 1979   1 son 53 1 daughter 50; 1 granddaughter Eliezer Lofts '07), 1 grandson (Seth '09)   Work: [employer]   Regular exercise: walking when he can   Caffeine use: coffee all day   Review of Systems Sleeps okay Appetite is usually okay Weight is stable Libido not as good-- no ED if "interested" Will occasionally swallow wrong and cough---no other cough    Objective:   Physical Exam  Constitutional: He appears well-developed and well-nourished. No distress.  Neck: Normal range of motion. Neck supple. No thyromegaly present.  Cardiovascular: Normal rate, regular rhythm, normal heart sounds and intact distal pulses.  Exam reveals no gallop.   No murmur heard. Pulmonary/Chest: Effort normal and  breath sounds normal. No respiratory distress. He has no wheezes. He has no rales.  Abdominal: Soft. There is no tenderness.  Musculoskeletal: He exhibits no edema or tenderness.  Lymphadenopathy:    He has no cervical adenopathy.  Skin:  Slight plantar callous--no ulcers  Psychiatric: He has a normal mood and affect. His behavior is normal.          Assessment & Plan:

## 2015-10-16 LAB — HEMOGLOBIN A1C: Hgb A1c MFr Bld: 7.7 % — ABNORMAL HIGH (ref 4.6–6.5)

## 2015-10-16 LAB — VITAMIN B12: VITAMIN B 12: 268 pg/mL (ref 211–911)

## 2015-10-28 ENCOUNTER — Other Ambulatory Visit: Payer: Self-pay | Admitting: Internal Medicine

## 2015-11-03 ENCOUNTER — Emergency Department (HOSPITAL_COMMUNITY)
Admission: EM | Admit: 2015-11-03 | Discharge: 2015-11-03 | Disposition: A | Discharge status: Home / Self Care | Payer: BC Managed Care – PPO | Attending: Emergency Medicine | Admitting: Emergency Medicine

## 2015-11-03 ENCOUNTER — Encounter (HOSPITAL_COMMUNITY): Payer: Self-pay | Admitting: Neurology

## 2015-11-03 ENCOUNTER — Telehealth: Payer: Self-pay | Admitting: Internal Medicine

## 2015-11-03 DIAGNOSIS — Z794 Long term (current) use of insulin: Secondary | ICD-10-CM | POA: Insufficient documentation

## 2015-11-03 DIAGNOSIS — Z7984 Long term (current) use of oral hypoglycemic drugs: Secondary | ICD-10-CM | POA: Insufficient documentation

## 2015-11-03 DIAGNOSIS — L0889 Other specified local infections of the skin and subcutaneous tissue: Secondary | ICD-10-CM | POA: Insufficient documentation

## 2015-11-03 DIAGNOSIS — Z7982 Long term (current) use of aspirin: Secondary | ICD-10-CM | POA: Insufficient documentation

## 2015-11-03 DIAGNOSIS — E119 Type 2 diabetes mellitus without complications: Secondary | ICD-10-CM | POA: Diagnosis not present

## 2015-11-03 DIAGNOSIS — R220 Localized swelling, mass and lump, head: Secondary | ICD-10-CM | POA: Diagnosis present

## 2015-11-03 DIAGNOSIS — L089 Local infection of the skin and subcutaneous tissue, unspecified: Secondary | ICD-10-CM

## 2015-11-03 LAB — CBC WITH DIFFERENTIAL/PLATELET
BASOS ABS: 0 10*3/uL (ref 0.0–0.1)
Basophils Relative: 0 %
Eosinophils Absolute: 0 10*3/uL (ref 0.0–0.7)
Eosinophils Relative: 0 %
HEMATOCRIT: 48.7 % (ref 39.0–52.0)
Hemoglobin: 16.1 g/dL (ref 13.0–17.0)
LYMPHS PCT: 15 %
Lymphs Abs: 1.4 10*3/uL (ref 0.7–4.0)
MCH: 28.9 pg (ref 26.0–34.0)
MCHC: 33.1 g/dL (ref 30.0–36.0)
MCV: 87.4 fL (ref 78.0–100.0)
Monocytes Absolute: 0.6 10*3/uL (ref 0.1–1.0)
Monocytes Relative: 6 %
NEUTROS ABS: 7.2 10*3/uL (ref 1.7–7.7)
Neutrophils Relative %: 78 %
Platelets: 198 10*3/uL (ref 150–400)
RBC: 5.57 MIL/uL (ref 4.22–5.81)
RDW: 12.6 % (ref 11.5–15.5)
WBC: 9.2 10*3/uL (ref 4.0–10.5)

## 2015-11-03 LAB — BASIC METABOLIC PANEL
ANION GAP: 9 (ref 5–15)
BUN: 13 mg/dL (ref 6–20)
CO2: 26 mmol/L (ref 22–32)
Calcium: 9.6 mg/dL (ref 8.9–10.3)
Chloride: 105 mmol/L (ref 101–111)
Creatinine, Ser: 1.3 mg/dL — ABNORMAL HIGH (ref 0.61–1.24)
GFR calc Af Amer: >60 mL/min (ref >60)
GFR, EST NON AFRICAN AMERICAN: 57 mL/min — AB (ref >60)
Glucose, Bld: 236 mg/dL — ABNORMAL HIGH (ref 65–99)
POTASSIUM: 4.4 mmol/L (ref 3.5–5.1)
Sodium: 140 mmol/L (ref 135–145)

## 2015-11-03 MED ORDER — AMOXICILLIN-POT CLAVULANATE 875-125 MG PO TABS: 1.0000 | ORAL_TABLET | Freq: Two times a day (BID) | ORAL | 0 refills | Status: DC

## 2015-11-03 NOTE — Discharge Instructions (Signed)
Take all antibiotics as prescribed. Return here if any increased swelling, redness, or fever. Recheck with your doctor in next 1-3 days if symptoms improving.

## 2015-11-03 NOTE — ED Provider Notes (Signed)
[facility] DEPT Provider Note   CSN: [id number] Arrival date & time: 11/03/15  1328     History   Chief Complaint Chief Complaint  Patient presents with  . Facial Swelling    HPI Aaron Zimmerman is a 65 y.o. male.  HPI  This is a 65 year old man with a history of diabetes who presents today with height of right facial swelling. He noticed some nasal congestion and pressure during the night on the right cheek. This morning his wife noted some swelling in that area. She noted that his face looks somewhat asymmetrical from this and called his primary care physician. They told him that he needed to come to the ED for evaluation for stroke. He has not noted any vision changes, any other facial asymmetry, difficulty speaking, leg or arm weakness, or difficulty with gait. 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 Patient does not smoke, drink alcohol.    Past Medical History:  Diagnosis Date  . Diabetes mellitus ~2006  . Hyperlipidemia   . Personal history of urinary calculi   . Vertigo   . Vitamin B12 deficiency     Patient Active Problem List   Diagnosis Date Noted  . Vitamin B12 deficiency   . Viral upper respiratory infection 04/13/2015  . Chest pressure 04/09/2015  . Heat exhaustion 09/22/2014  . Chronic pruritus 07/14/2014  . Erectile dysfunction associated with type 2 diabetes mellitus ([place]) 07/14/2014  . Routine health maintenance 03/09/2013  . Polyp of colon, adenomatous 03/06/2013  . Solitary pulmonary nodule 07/22/2012  . B12 deficiency 03/01/2010  . ALLERGIC RHINITIS, SEASONAL, MILD 04/28/2009  . ONYCHOMYCOSIS, TOENAILS 03/02/2009  . Decreased libido 08/24/2007  . Diabetes mellitus with circulatory complication ([place]) [id number]  . Hyperlipemia 01/22/2007    Past Surgical History:  Procedure Laterality Date  . traumatic injury  and repair of RT hand  1974       Home Medications    Prior to Admission medications   Medication Sig Start Date End Date Taking? Authorizing Provider  aspirin 81 MG tablet Take 81 mg by mouth daily.    Historical Provider, MD  Aspirin-Salicylamide-Caffeine (BC HEADACHE POWDER PO) Take 1 Package by mouth daily as needed (headache).    Historical Provider, MD  cetirizine (ZYRTEC) 10 MG tablet Take 10 mg by mouth daily as needed for allergies.    Historical Provider, MD  dapagliflozin propanediol (FARXIGA) 10 MG TABS tablet Take 10 mg by mouth daily. 02/16/15   Venia Carbon, MD  LANTUS SOLOSTAR 100 UNIT/ML Solostar Pen INJECT 40 UNITS INTO THE SKIN DAILY 07/03/15   Venia Carbon, MD  lisinopril (PRINIVIL,ZESTRIL) 10 MG tablet TAKE 1 TABLET BY MOUTH DAILY 10/29/15   Venia Carbon, MD  metFORMIN (GLUCOPHAGE) 1000 MG tablet TAKE 1 TABLET BY MOUTH TWICE A DAY WITH A MEAL 05/20/15   Venia Carbon, MD  sildenafil (REVATIO) 20 MG tablet Take 1 tablet (20 mg total) by mouth 3 (three) times daily. Prn 10/15/15   Venia Carbon, MD  simvastatin (ZOCOR) 20 MG tablet TAKE ONE (1) TABLET BY MOUTH EVERY NIGHTAT BEDTIME 08/06/15   Venia Carbon, MD    Family History Family History  Problem Relation Age of Onset  . Heart disease Mother     CAD/PCI with stents; cardiomyopathy with AICD  . Cancer Other     Colon and Prostate  .  Colon cancer Neg Hx     Social History Social History  Substance Use Topics  . Smoking status: Never Smoker  . Smokeless tobacco: Former Systems developer    Types: Chew    Quit date: 02/08/1995  . Alcohol use No     Allergies   Pioglitazone   Review of Systems Review of Systems  All other systems reviewed and are negative.    Physical Exam Updated Vital Signs BP 110/75 (BP Location: Right Arm)   Pulse 89   Temp 98.2 F (36.8 C) (Oral)   Resp 18   SpO2 96%   Physical Exam  Constitutional: He is oriented to person, place, and time. He appears well-developed  and well-nourished.  HENT:  Head: Normocephalic and atraumatic.  Right Ear: External ear normal.  Left Ear: External ear normal.  Poor dentition with some tenderness palpation and come off of right canine. Mild erythema with swelling right cheek with some decrease of right nasolabial fold. No palpable fluctuance or obvious mass noted.  Eyes: EOM are normal. Pupils are equal, round, and reactive to light.  Neck: Normal range of motion.  Cardiovascular: Normal rate.   Pulmonary/Chest: Effort normal.  Abdominal: Soft.  Musculoskeletal: Normal range of motion.  Neurological: He is alert and oriented to person, place, and time. He has normal reflexes. He displays normal reflexes. No cranial nerve deficit. He exhibits normal muscle tone. Coordination normal.  Nursing note and vitals reviewed.    ED Treatments / Results  Labs (all labs ordered are listed, but only abnormal results are displayed) Labs Reviewed  BASIC METABOLIC PANEL - Abnormal; Notable for the following:       Result Value   Glucose, Bld 236 (*)    Creatinine, Ser 1.30 (*)    GFR calc non Af Amer 57 (*)    All other components within normal limits  CBC WITH DIFFERENTIAL/PLATELET    EKG  EKG Interpretation None       Radiology No results found.  Procedures Procedures (including critical care time)  Medications Ordered in ED Medications - No data to display   Initial Impression / Assessment and Plan / ED Course  I have reviewed the triage vital signs and the nursing notes.  Pertinent labs & imaging results that were available during my care of the patient were reviewed by me and considered in my medical decision making (see chart for details).  Clinical Course    66 year old male with diabetes who presents stable some right facial swelling most consistent with either dental infection or sinusitis. Lab workup here reveals normal CBC with normal white blood cell count. His creatinine is slightly elevated  at 1.3 and blood sugars elevated at 236 with otherwise normal labs. I discussed these results with the patient and his wife. Plan to place the patient on Augmentin to cover possible sinusitis or dental infection. We have discussed that if his symptoms worsen, especially increased swelling, redness, or fever he should return here. Otherwise he will follow-up with his primary care physician and dentist. I have done a complete neurological exam and had no concern that there is any evidence of stroke. Sounds like from the history with his wife that the decreased nasolabial fold from the slight swelling had madeasymmetrical there is no other evidence of stroke. Patient and wife advised and agree with plan.  Final Clinical Impressions(s) / ED Diagnoses   Final diagnoses:  Facial infection    New Prescriptions New Prescriptions   No medications on file  Pattricia Boss, MD 11/03/15 1726

## 2015-11-03 NOTE — Telephone Encounter (Signed)
Aaron Zimmerman from [facility] said pts wife is upset because pt was taken to the back at ED, lab drawn and then pt was returned to waiting area. I spoke with [place] and there are several people ahead of pt and Mrs Joneen Caraway wants to know if pt can be worked in at [facility]. I spoke with Dr Glori Bickers and she said pt should stay at ED to be eval to determine cause of his symptoms and to let the ED staff know of any changes in pts condition. Mrs Joneen Caraway voiced understanding and appreciative. FYI to Dr Glori Bickers.

## 2015-11-03 NOTE — ED Notes (Signed)
PA Kayla asked to evaluate patient regarding facial droop. Agree will order basic labs for now. Will continue to monitor.

## 2015-11-03 NOTE — Telephone Encounter (Signed)
Spoke to [place] regarding this

## 2015-11-03 NOTE — Telephone Encounter (Signed)
Patient Name: Aaron Zimmerman DOB: Mar 29, 1950 Initial Comment Caller she has facial swelling, lip drooping. Nurse Assessment Nurse: Vallery Sa, RN, Cathy Date/Time (Eastern Time): 11/03/2015 1:07:16 PM Confirm and document reason for call. If symptomatic, describe symptoms. You must click the next button to save text entered. ---Caller states Aaron Zimmerman developed drooping and swelling on the right side of his face today. No injury. No breathing or swallowing difficulty. Alert and responsive. Has the patient traveled out of the country within the last 30 days? ---No Does the patient have any new or worsening symptoms? ---Yes Will a triage be completed? ---Yes Related visit to physician within the last 2 weeks? ---No Does the PT have any chronic conditions? (i.e. diabetes, asthma, etc.) ---Yes List chronic conditions. ---Diabetes, Blockage in brain, On Cholesterol Is this a behavioral health or substance abuse call? ---No Guidelines Guideline Title Affirmed Question Affirmed Notes Neurologic Deficit [1] Weakness (i.e., paralysis, loss of muscle strength) of the face, arm / hand, or leg / foot on one side of the body AND [2] sudden onset AND [3] present now Final Disposition User Call EMS 911 Now Vallery Sa, RN, [place] Disagree/Comply: Ashby Dawes states her husband is having someone drive him to [facility] ER.

## 2015-11-03 NOTE — ED Triage Notes (Addendum)
Pt here after the recommendation of his family and doctor when they noticed right sided facial droop. Right cheek is swollen and tender to touch. No other deficits noted. Speech is clear, does appear to have right sided  Droop, but swelling to right cheek. A x 4. Does report sinus pain and drainage this morning.

## 2015-11-03 NOTE — Telephone Encounter (Signed)
If he is having facial droop,  they more than likely will need to do a CT scan (I suspect)- spoke with Rena to have patient check in with the triage nurse in the waiting room regarding his status -I would not advise them to leave the ED at this time

## 2015-11-03 NOTE — Telephone Encounter (Signed)
Per chart review pt is at ED now.

## 2015-11-03 NOTE — Telephone Encounter (Signed)
See previous note from [facility] today.

## 2015-11-03 NOTE — ED Provider Notes (Signed)
MSE was initiated and I personally evaluated the patient and placed orders (if any) at  2:00 PM on November 03, 2015.  Patient presents with right cheek swelling and pain that began this morning.  No numbness, weakness, slurred speech, headache, or visual disturbance.  No fevers.  No CP, neck pain, or SOB.  He complains of nasal congestion and sinus drainage.  General: Alert and no acute distress HEENT: atraumatic.  Right facial swelling about the zygoma with tenderness. Respiratory: normal effort, no retractions, no use of accessory muscles Cardiac: regular rate and rhythm Abdominal: nondistended, soft Neuro: AOx3 .  No gross deficit.  No drift.  Normal strength and sensation throughout upper and lower extremities. Psych: normal speech, appropriate mood and affect   The patient appears stable so that the remainder of the MSE may be completed by another provider.      Gloriann Loan, PA-C 11/03/15 1405    Merrily Pew, MD 11/04/15 1228

## 2015-11-03 NOTE — Telephone Encounter (Signed)
Patient Name: Aaron Zimmerman DOB: 1950/06/06 Initial Comment Caller states she is at ED with husband, per referral from RN. Having facial swelling and lip drooping. Nurse Assessment Nurse: Vallery Sa, RN, Cathy Date/Time (Eastern Time): 11/03/2015 2:25:04 PM Confirm and document reason for call. If symptomatic, describe symptoms. You must click the next button to save text entered. ---Vaughan Basta states they are in the ER now. Her husband was examined, labwork was drawn and they sent them back out to the waiting room. She asks if Rayne can be seen at the office rather than waiting in the ER. No appointments available. Called the office backline and Reena will speak with them further. Has the patient traveled out of the country within the last 30 days? ---Not Applicable Does the patient have any new or worsening symptoms? ---Yes Will a triage be completed? ---No Select reason for no triage. ---Other

## 2015-11-04 NOTE — Telephone Encounter (Signed)
Spoke to pt. He will start the abx. Made appt for 11-06-15

## 2015-11-04 NOTE — Telephone Encounter (Signed)
He was seen and diagnosed with facial or dental infection and sent home on antibiotics. Please check on him today He should have follow up later this week unless his face returns completely to normal (but still should probably check in with his dentist)

## 2015-11-06 ENCOUNTER — Encounter: Payer: Self-pay | Admitting: Internal Medicine

## 2015-11-06 ENCOUNTER — Ambulatory Visit (INDEPENDENT_AMBULATORY_CARE_PROVIDER_SITE_OTHER): Payer: BC Managed Care – PPO | Admitting: Internal Medicine

## 2015-11-06 VITALS — BP 110/70 | HR 90 | Temp 97.6°F | Wt 229.0 lb

## 2015-11-06 DIAGNOSIS — R944 Abnormal results of kidney function studies: Secondary | ICD-10-CM | POA: Diagnosis not present

## 2015-11-06 DIAGNOSIS — K047 Periapical abscess without sinus: Secondary | ICD-10-CM | POA: Insufficient documentation

## 2015-11-06 LAB — RENAL FUNCTION PANEL
Albumin: 4.2 g/dL (ref 3.5–5.2)
BUN: 19 mg/dL (ref 6–23)
CHLORIDE: 104 meq/L (ref 96–112)
CO2: 27 mEq/L (ref 19–32)
Calcium: 9.2 mg/dL (ref 8.4–10.5)
Creatinine, Ser: 1.07 mg/dL (ref 0.40–1.50)
GFR: 74.19 mL/min (ref >60.00)
Glucose, Bld: 175 mg/dL — ABNORMAL HIGH (ref 70–99)
Phosphorus: 3.3 mg/dL (ref 2.3–4.6)
Potassium: 4.5 mEq/L (ref 3.5–5.1)
SODIUM: 140 meq/L (ref 135–145)

## 2015-11-06 NOTE — Progress Notes (Signed)
Pre visit review using our clinic review tool, if applicable. No additional management support is needed unless otherwise documented below in the visit note. 

## 2015-11-06 NOTE — Assessment & Plan Note (Signed)
Seems clearly to have been the problem Now better on augmentin Urged him to finish this out--and keep his appt with dentist

## 2015-11-06 NOTE — Progress Notes (Signed)
Subjective:    Patient ID: Aaron Zimmerman, male    DOB: [birth year], 65 y.o.   MRN: [id number]  HPI Here for ER follow up--with wife  Sudden onset of symptoms on right side of face--3 nights ago Didn't feel bad but face was swollen No redness or pain/tenderness (but was tender in ER) Has some bad teeth due for removed Some right mouth drooping (no dysphagia)  Does feel better now  Current Outpatient Prescriptions on File Prior to Visit  Medication Sig Dispense Refill  . amoxicillin-clavulanate (AUGMENTIN) 875-125 MG tablet Take 1 tablet by mouth every 12 (twelve) hours. 14 tablet 0  . aspirin 81 MG tablet Take 81 mg by mouth daily.    . Aspirin-Salicylamide-Caffeine (BC HEADACHE POWDER PO) Take 1 Package by mouth daily as needed (headache).    . cetirizine (ZYRTEC) 10 MG tablet Take 10 mg by mouth daily as needed for allergies.    . dapagliflozin propanediol (FARXIGA) 10 MG TABS tablet Take 10 mg by mouth daily. 90 tablet 3  . LANTUS SOLOSTAR 100 UNIT/ML Solostar Pen INJECT 40 UNITS INTO THE SKIN DAILY 15 mL 5  . lisinopril (PRINIVIL,ZESTRIL) 10 MG tablet TAKE 1 TABLET BY MOUTH DAILY 90 tablet 3  . metFORMIN (GLUCOPHAGE) 1000 MG tablet TAKE 1 TABLET BY MOUTH TWICE A DAY WITH A MEAL 60 tablet 11  . sildenafil (REVATIO) 20 MG tablet Take 1 tablet (20 mg total) by mouth 3 (three) times daily. Prn 50 tablet 11  . simvastatin (ZOCOR) 20 MG tablet TAKE ONE (1) TABLET BY MOUTH EVERY NIGHTAT BEDTIME 90 tablet 3   No current facility-administered medications on file prior to visit.     Allergies  Allergen Reactions  . Pioglitazone Rash    Past Medical History:  Diagnosis Date  . Diabetes mellitus ~2006  . Hyperlipidemia   . Personal history of urinary calculi   . Vertigo   . Vitamin B12 deficiency     Past Surgical History:  Procedure Laterality Date  . traumatic injury and repair of RT hand  1974    Family History  Problem Relation Age of Onset  . Heart disease Mother       CAD/PCI with stents; cardiomyopathy with AICD  . Cancer Other     Colon and Prostate  . Colon cancer Neg Hx     Social History   Social History  . Marital status: Married    Spouse name: N/A  . Number of children: 2  . Years of education: N/A   Occupational History  . 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 Smoking status: Never Smoker  . Smokeless tobacco: Former Systems developer    Types: Chew    Quit date: 02/08/1995  . Alcohol use No  . Drug use: No  . Sexual activity: Yes   Other Topics Concern  . Not on file   Social History Narrative   HSG   Married 1979   1 son 51 1 daughter 23; 1 granddaughter Eliezer Lofts '07), 1 grandson (Seth '09)   Work: [employer]   Regular exercise: walking when he can   Caffeine use: coffee all day   Review of Systems  No aphasia No focal weakness      Objective:   Physical Exam  HENT:  Facial swelling is gone Slight sensation with palpation but not really tender Several broken teeth, including right upper incisor--no active inflammation  Assessment & Plan:

## 2015-11-06 NOTE — Assessment & Plan Note (Signed)
Creatinine increased in ER Will recheck today

## 2015-12-05 ENCOUNTER — Ambulatory Visit (INDEPENDENT_AMBULATORY_CARE_PROVIDER_SITE_OTHER): Payer: BC Managed Care – PPO | Admitting: Family Medicine

## 2015-12-05 VITALS — BP 104/66 | HR 86 | Temp 97.6°F | Resp 18 | Wt 221.0 lb

## 2015-12-05 DIAGNOSIS — B349 Viral infection, unspecified: Secondary | ICD-10-CM

## 2015-12-05 MED ORDER — KETOROLAC TROMETHAMINE 60 MG/2ML IM SOLN
60.0000 mg | Freq: Once | INTRAMUSCULAR | Status: AC
  Administered 2015-12-05: 30 mg via INTRAMUSCULAR

## 2015-12-05 NOTE — Progress Notes (Signed)
Pre visit review using our clinic review tool, if applicable. No additional management support is needed unless otherwise documented below in the visit note. 

## 2015-12-05 NOTE — Progress Notes (Signed)
Chief Complaint  Patient presents with  . Headache    nausea, vomitted once yesterday, fever, body aches, headache 6-7 on pain scale.     Aaron Zimmerman here for URI complaints. Here with his wife.  Duration: 1 day  Associated symptoms: myalgia and N/V, fevers, headache, poor appetite Denies: shortness of breath, abd pain, diarrhea, chest pain Treatment to date: OTC acetaminophen PRN and rest; Tylenol was helpful Sick contacts: No  ROS:  Const: +fever GI: No diarrhea, +N/V yesterday, no abd pain  Past Medical History:  Diagnosis Date  . Diabetes mellitus ~2006  . Hyperlipidemia   . Personal history of urinary calculi   . Vertigo   . Vitamin B12 deficiency    Family History  Problem Relation Age of Onset  . Heart disease Mother     CAD/PCI with stents; cardiomyopathy with AICD  . Cancer Other     Colon and Prostate  . Colon cancer Neg Hx     BP 104/66   Pulse 86   Temp 97.6 F (36.4 C) (Oral)   Resp 18   Wt 221 lb (100.2 kg)   SpO2 96%   BMI 30.82 kg/m  General: Awake, alert, appears stated age HEENT: AT, [place], ears patent b/l and TM's neg, nares patent w/o discharge, pharynx pink and without exudates, MMM, poor dentition Neck: No masses or asymmetry Heart: RRR, no murmurs, no bruits Lungs: CTAB, no accessory muscle use Abd: BS+, soft, NT, ND Psych: Age appropriate judgment and insight, normal mood and affect  Viral illness - Plan: ketorolac (TORADOL) injection 60 mg  Orders as above. Vague symptoms, he does not present like a typical influenza infection minus the myalgias. Renal function reviewed, 30 mg of Toradol given. OK to continue using Tylenol for misery. Not nauseated since yesterday so no anti-emetic. Practice good hand hygiene. F/u this coming week if symptoms worsen or fail to improve. Pt voiced understanding and agreement to the plan.  Ashley, DO 12/05/15 11:01 AM

## 2015-12-05 NOTE — Patient Instructions (Signed)
Call your PCP's office on Monday if you are not feeling better.  OK to use Tylenol (2 extra strength tablets 1000 mg or 3 normal strength tabs 975 mg) three times daily as needed for misery and/or fever.

## 2015-12-07 ENCOUNTER — Telehealth: Payer: Self-pay

## 2015-12-07 NOTE — Telephone Encounter (Signed)
noted 

## 2015-12-07 NOTE — Telephone Encounter (Signed)
PLEASE NOTE: All timestamps contained within this report are represented as [place] Standard Time. CONFIDENTIALTY NOTICE: This fax transmission is intended only for the addressee. It contains information that is legally privileged, confidential or otherwise protected from use or disclosure. If you are not the intended recipient, you are strictly prohibited from reviewing, disclosing, copying using or disseminating any of this information or taking any action in reliance on or regarding this information. If you have received this fax in error, please notify us immediately by telephone so that we can arrange for its return to [place]. Phone: [phone], Toll-Free: [phone], Fax: [phone] Page: 1 of 1 Call Id: [id number] [place] Patient Name: Aaron Zimmerman Gender: Male DOB: 1950-03-31 Age: 65 Y 23 D Return Phone Number: [phone] (Primary), [phone] (Secondary) Address: City/State/Zip: [place] [address] Client [facility] Night - Client Client Site [place] Physician Viviana Simpler - MD Contact Type Call Who Is Calling Patient / Member / Family / Caregiver Call Type Triage / Clinical Caller Name Royal Hawthorn Relationship To Patient Spouse Return Phone Number [phone] (Primary) Chief Complaint Fever (non urgent symptom) (> THREE MONTHS) Reason for Call Symptomatic / Request for [place] says the nurse told her about 30 min ago to call back to see about having her husband seen this AM at the [facility] location. PT has fever of 100, body aches, headache pain scale of 6, and has no energy. She gave him Tylenol Translation No Nurse Assessment Guidelines Guideline Title Affirmed Question Affirmed Notes Nurse Date/Time (Eastern Time) Disp. Time Eilene Ghazi Time) Disposition Final  User 12/05/2015 9:13:44 AM Call Completed Christel Mormon, RN, Levada Dy 12/05/2015 9:13:37 AM Clinical Call Yes Christel Mormon, RN, Levada Dy Comments User: Donnie Aho, RN Date/Time Eilene Ghazi Time): 12/05/2015 9:13:24 AM Triaged about 30 minutes ago to be seen. Wife called a different number for the [place] office than the one we have listed. I gave her the [phone] number and she states she will call that number. Wife wanted him seen today.

## 2015-12-07 NOTE — Telephone Encounter (Signed)
Per chart review tab pt was seen Sat clinic on 12/05/15. Dr Silvio Pate not in office.

## 2015-12-07 NOTE — Telephone Encounter (Signed)
PLEASE NOTE: All timestamps contained within this report are represented as [place] Standard Time. CONFIDENTIALTY NOTICE: This fax transmission is intended only for the addressee. It contains information that is legally privileged, confidential or otherwise protected from use or disclosure. If you are not the intended recipient, you are strictly prohibited from reviewing, disclosing, copying using or disseminating any of this information or taking any action in reliance on or regarding this information. If you have received this fax in error, please notify us immediately by telephone so that we can arrange for its return to [place]. Phone: [phone], Toll-Free: [phone], Fax: [phone] Page: 1 of 2 Call Id: [id number] [place] Patient Name: Aaron Zimmerman Gender: Male DOB: April 13, 1950 Age: 65 Y 23 D Return Phone Number: [phone] (Primary), [phone] (Secondary) Address[place] [address] Client [facility] - Client Client Site [place] Physician Viviana Simpler - MD Contact Type Call Who Is Calling Patient / Member / Family / Caregiver Call Type Triage / Clinical Caller Name Royal Hawthorn Relationship To Patient Spouse Return Phone Number [phone] (Primary) Chief Complaint Joint Pain Reason for Call Symptomatic / Request for Health Information Initial Comment CAller states wants appt for husband today; chills/ fever, body aches, no energy; PreDisposition Call Doctor Translation No Nurse Assessment Nurse: Christel Mormon, RN, Levada Dy Date/Time (Eastern Time): 12/05/2015 8:23:42 AM Confirm and document reason for call. If symptomatic, describe symptoms. You must click the next button to save text entered. ---Late yesterday afternoon, husband started getting fever, chills, body aches and no energy. Temp is  low grade around 100 or so. He also has a bad headache and vomited once last night. Has the patient traveled out of the country within the last 30 days? ---No Does the patient have any new or worsening symptoms? ---Yes Will a triage be completed? ---Yes Related visit to physician within the last 2 weeks? ---No Does the PT have any chronic conditions? (i.e. diabetes, asthma, etc.) ---Yes List chronic conditions. ---diabetes Is this a behavioral health or substance abuse call? ---No Guidelines Guideline Title Affirmed Question Affirmed Notes Nurse Date/Time (Eastern Time) Sinus Pain or Congestion [1] Fever > 100.5 F (38.1 C) AND [2] diabetes mellitus or weak immune system (e.g., HIV positive, cancer chemo, splenectomy, organ transplant, chronic steroids) [place], RN, Levada Dy 12/05/2015 8:27:41 AM PLEASE NOTE: All timestamps contained within this report are represented as [place] Standard Time. CONFIDENTIALTY NOTICE: This fax transmission is intended only for the addressee. It contains information that is legally privileged, confidential or otherwise protected from use or disclosure. If you are not the intended recipient, you are strictly prohibited from reviewing, disclosing, copying using or disseminating any of this information or taking any action in reliance on or regarding this information. If you have received this fax in error, please notify us immediately by telephone so that we can arrange for its return to [place]. Phone: [phone], Toll-Free: [phone], Fax: [phone] Page: 2 of 2 Call Id: [id number] [place]. Time Eilene Ghazi Time) Disposition Final User 12/05/2015 8:32:52 AM See Physician within 4 Hours (or PCP triage) Yes Christel Mormon, RN, Marin Shutter Understands: Yes Disagree/Comply: Comply Care Advice Given Per Guideline SEE PHYSICIAN WITHIN 4 HOURS (or PCP triage): PAIN OR FEVER MEDICINES: * For pain and fever relief, take acetaminophen or ibuprofen. * Treat fevers above  101 F (38.3 C). * The goal of fever therapy is to bring the fever down  to a comfortable level. Remember that fever medicine usually lowers fever 2-3 F (1-1.5 C). CALL BACK IF: * You become worse. CARE ADVICE given per Sinus Pain or Congestion (Adult) guideline. Comments User: Jerrye Beavers Date/Time Eilene Ghazi Time): 12/05/2015 8:19:12 AM This call came in on [place] Night. User: Donnie Aho, RN Date/Time Eilene Ghazi Time): 12/05/2015 8:27:22 AM She states no respiratory isuses but does have a bad headache in the center of his forehead. Referrals [facility]

## 2016-01-22 ENCOUNTER — Other Ambulatory Visit: Payer: Self-pay | Admitting: Internal Medicine

## 2016-03-14 ENCOUNTER — Other Ambulatory Visit: Payer: Self-pay | Admitting: Internal Medicine

## 2016-04-18 ENCOUNTER — Ambulatory Visit: Payer: BC Managed Care – PPO | Admitting: Internal Medicine

## 2016-05-04 ENCOUNTER — Ambulatory Visit (INDEPENDENT_AMBULATORY_CARE_PROVIDER_SITE_OTHER): Payer: BC Managed Care – PPO | Admitting: Internal Medicine

## 2016-05-04 ENCOUNTER — Encounter: Payer: Self-pay | Admitting: Internal Medicine

## 2016-05-04 VITALS — BP 110/70 | HR 77 | Temp 97.6°F | Ht 67.5 in | Wt 221.0 lb

## 2016-05-04 DIAGNOSIS — Z0000 Encounter for general adult medical examination without abnormal findings: Secondary | ICD-10-CM | POA: Diagnosis not present

## 2016-05-04 DIAGNOSIS — N521 Erectile dysfunction due to diseases classified elsewhere: Secondary | ICD-10-CM | POA: Diagnosis not present

## 2016-05-04 DIAGNOSIS — Z794 Long term (current) use of insulin: Secondary | ICD-10-CM | POA: Diagnosis not present

## 2016-05-04 DIAGNOSIS — Z23 Encounter for immunization: Secondary | ICD-10-CM

## 2016-05-04 DIAGNOSIS — E1169 Type 2 diabetes mellitus with other specified complication: Secondary | ICD-10-CM | POA: Diagnosis not present

## 2016-05-04 DIAGNOSIS — E0859 Diabetes mellitus due to underlying condition with other circulatory complications: Secondary | ICD-10-CM

## 2016-05-04 LAB — HEMOGLOBIN A1C: Hgb A1c MFr Bld: 8.3 % — ABNORMAL HIGH (ref 4.6–6.5)

## 2016-05-04 LAB — HM DIABETES FOOT EXAM

## 2016-05-04 MED ORDER — CETIRIZINE HCL 10 MG PO TABS: 10.0000 mg | ORAL_TABLET | Freq: Every day | ORAL | 3 refills | Status: AC | PRN

## 2016-05-04 MED ORDER — LISINOPRIL 10 MG PO TABS: 10.0000 mg | ORAL_TABLET | Freq: Every day | ORAL | 3 refills | Status: DC

## 2016-05-04 NOTE — Progress Notes (Signed)
Subjective:    Patient ID: Aaron Zimmerman, male    DOB: [birth year], 66 y.o.   MRN: [id number]  HPI Here for physical  Has noticed more urinary frequency Has urgency and some incontinence if he is not careful Some nocturia He feels the stream is fine Had brief burning--better with increased fluids Discussed the farxiga---discussed scheduled voiding Still feels libido is okay Uses sildenafil--- but concerned about the cost  Checks sugars regularly Usually 130-150 fasting No hypoglycemic reactions of note No sig foot numbness or pain. Using diabetic socks. Some tingling at times --like after getting out of boots  Current Outpatient Prescriptions on File Prior to Visit  Medication Sig Dispense Refill  . aspirin 81 MG tablet Take 81 mg by mouth daily.    . Aspirin-Salicylamide-Caffeine (BC HEADACHE POWDER PO) Take 1 Package by mouth daily as needed (headache).    . cetirizine (ZYRTEC) 10 MG tablet Take 10 mg by mouth daily as needed for allergies.    Marland Kitchen FARXIGA 10 MG TABS tablet TAKE 1 TABLET BY MOUTH DAILY 90 tablet 3  . LANTUS SOLOSTAR 100 UNIT/ML Solostar Pen INJECT 40 UNITS INTO THE SKIN DAILY 15 mL 5  . lisinopril (PRINIVIL,ZESTRIL) 10 MG tablet TAKE 1 TABLET BY MOUTH DAILY 90 tablet 3  . metFORMIN (GLUCOPHAGE) 1000 MG tablet TAKE 1 TABLET BY MOUTH TWICE A DAY WITH A MEAL 60 tablet 11  . simvastatin (ZOCOR) 20 MG tablet TAKE ONE (1) TABLET BY MOUTH EVERY NIGHTAT BEDTIME 90 tablet 3   No current facility-administered medications on file prior to visit.     Allergies  Allergen Reactions  . Pioglitazone Rash    Past Medical History:  Diagnosis Date  . Diabetes mellitus ~2006  . Hyperlipidemia   . Personal history of urinary calculi   . Vertigo   . Vitamin B12 deficiency     Past Surgical History:  Procedure Laterality Date  . traumatic injury and repair of RT hand  1974    Family History  Problem Relation Age of Onset  . Heart disease Mother     CAD/PCI with  stents; cardiomyopathy with AICD  . Cancer Other     Colon and Prostate  . Colon cancer Neg Hx     Social History   Social History  . Marital status: Married    Spouse name: N/A  . Number of children: 2  . Years of education: N/A   Occupational History  . 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 Smoking status: Never Smoker  . Smokeless tobacco: Former Systems developer    Types: Chew    Quit date: 02/08/1995  . Alcohol use No  . Drug use: No  . Sexual activity: Yes   Other Topics Concern  . Not on file   Social History Narrative   HSG   Married 1979   1 son 60 1 daughter 78; 1 granddaughter Eliezer Lofts '07), 1 grandson (Seth '09)   Work: [employer]   Regular exercise: walking when he can   Caffeine use: coffee all day   Review of Systems  Constitutional: Negative for fatigue.       Weight fairly stable [place] [facility] Wears seat belt  HENT: Positive for hearing loss. Negative for trouble swallowing.        Rare tinnitus Keeps up with dentist---may need extractions  Eyes: Negative for visual disturbance.  No diplopia or unilateral vision loss  Respiratory: Positive for cough. Negative for chest tightness and shortness of breath.        Cough--- if eats or drinks too fast  Cardiovascular: Negative for chest pain, palpitations and leg swelling.  Gastrointestinal: Negative for abdominal pain, blood in stool, constipation and nausea.       No sig heartburn  Endocrine: Negative for polydipsia.  Genitourinary: Positive for urgency. Negative for hematuria.  Musculoskeletal: Negative for arthralgias, back pain and joint swelling.  Skin: Negative for rash.       No suspicious lesions  Allergic/Immunologic: Positive for environmental allergies. Negative for immunocompromised state.       Recent cold-- better now (may have been allergies)  Neurological: Negative for dizziness, syncope,  light-headedness and headaches.  Hematological: Negative for adenopathy. Bruises/bleeds easily.  Psychiatric/Behavioral: Negative for dysphoric mood and sleep disturbance. The patient is not nervous/anxious.        Objective:   Physical Exam  Constitutional: He appears well-developed and well-nourished. No distress.  HENT:  Head: Normocephalic.  Right Ear: External ear normal.  Left Ear: External ear normal.  Mouth/Throat: Oropharynx is clear and moist. No oropharyngeal exudate.  Eyes: Conjunctivae are normal. Pupils are equal, round, and reactive to light.  Neck: No thyromegaly present.  Cardiovascular: Normal rate, regular rhythm, normal heart sounds and intact distal pulses.  Exam reveals no gallop.   No murmur heard. Pulmonary/Chest: Effort normal and breath sounds normal. No respiratory distress. He has no wheezes. He has no rales.  Abdominal: Soft. He exhibits no distension. There is no tenderness. There is no rebound and no guarding.  Musculoskeletal: He exhibits no edema or tenderness.  Lymphadenopathy:    He has no cervical adenopathy.  Neurological:  Normal sensation in feet  Skin: No rash noted.  No foot lesions Mild plantar callous  Psychiatric: He has a normal mood and affect. His behavior is normal.          Assessment & Plan:

## 2016-05-04 NOTE — Addendum Note (Signed)
Addended by: Pilar Grammes on: 05/04/2016 11:56 AM   Modules accepted: Orders

## 2016-05-04 NOTE — Progress Notes (Signed)
Pre visit review using our clinic review tool, if applicable. No additional management support is needed unless otherwise documented below in the visit note. 

## 2016-05-04 NOTE — Assessment & Plan Note (Signed)
Discussed the sildenafil

## 2016-05-04 NOTE — Assessment & Plan Note (Signed)
Healthy Trying to work on fitness Colon due 2020 Will defer PSA to next year at least

## 2016-05-04 NOTE — Assessment & Plan Note (Signed)
Seems to have reasonable control Will check labs 

## 2016-05-04 NOTE — Patient Instructions (Signed)
You can check the price of sildenafil at [place].

## 2016-05-21 ENCOUNTER — Other Ambulatory Visit: Payer: Self-pay | Admitting: Internal Medicine

## 2016-05-23 ENCOUNTER — Telehealth: Payer: Self-pay

## 2016-05-23 NOTE — Telephone Encounter (Signed)
Mrs Aaron Zimmerman left v/m; pharmacy advised pt that lantus was not approved by pts ins. Request prior auth done; pt has tried levemir before and that did not work for pt. Call [phone] and let them know lantus is medical necessity. Pt is out of insulin.

## 2016-05-25 MED ORDER — INSULIN DEGLUDEC 100 UNIT/ML ~~LOC~~ SOPN: 40.0000 [IU] | PEN_INJECTOR | Freq: Every day | SUBCUTANEOUS | 11 refills | Status: DC

## 2016-05-25 NOTE — Telephone Encounter (Signed)
Spoke to pt. Advised him I have sent in a new rx for the [place]. Told him to keep a watch on his blood sugar.

## 2016-05-25 NOTE — Telephone Encounter (Signed)
We had to do a PA for it last year if I remember. I will take care of it.

## 2016-05-25 NOTE — Telephone Encounter (Signed)
Please call  He needs the lantus---but I don't object to him trying the generic that is out Tell him he probably needs to buy at least 1 vial pending the approval to prevent his sugars getting out of control

## 2016-05-25 NOTE — Telephone Encounter (Signed)
Okay to try tresiba---it is the same medication made by another company

## 2016-05-25 NOTE — Telephone Encounter (Signed)
Spoke to pt to let him know what was going on. Left wife a message, also.  Dr Silvio Pate, most likely, he will have to try [place]. Can you tell me what to order incase they let me know this afternoon so I can send in his medication? His blood sugar is around 180-200 without Lantus.

## 2016-05-25 NOTE — Telephone Encounter (Signed)
Spoke to Aaron Zimmerman at [employer] to do Urgent Formulary Exemption for the Lantus like I did last year. Unfortunately, this year, they are making it even harder. They ask if he has tried and failed Engineer, agricultural, Levemir, AND [place]. 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 Has not tried [place]. Should have a response in the next 24 hours.

## 2016-06-06 ENCOUNTER — Telehealth: Payer: Self-pay

## 2016-06-06 ENCOUNTER — Other Ambulatory Visit: Payer: Self-pay | Admitting: Internal Medicine

## 2016-06-06 NOTE — Telephone Encounter (Signed)
I would use the medication he has, with inc in dose gradually to get sugar lower.  Would check AM sugar and if >150, then add 1 unit to total dose that day.  Ex [phone], etc units until sugar improved.  Have them update PCP in a few days.   Thanks.

## 2016-06-06 NOTE — Telephone Encounter (Signed)
Mrs Aaron Zimmerman left v/m since pt had to stop lantus and start [place] pts blood sugars are 180-250; pt is more irritable than usual. Mrs Aaron Zimmerman request cb to her or pt. [name] can change back to lantus or try something different because [name] is not working well for pt.  Dr Silvio Pate out of office.

## 2016-06-07 NOTE — Telephone Encounter (Signed)
I would agree. Please slowly increase the dose till the AM sugars are all under 140-150. The [name] is the same medication but made by a different company. There can be as much as a 20% difference in dosing--so he may just need more If he can't get it controlled, we will have to apply for an authorization to change back to lantus (but it will probably cost a lot more money)

## 2016-06-07 NOTE — Telephone Encounter (Signed)
Dr. Damita Dunnings replied to this last evening and I am just now getting around to calling.  Dr. Silvio Pate is back in the office now, is this still your recommendation?

## 2016-06-07 NOTE — Telephone Encounter (Signed)
Spoke to pt. He said he is already up to 60 units of Tresiba and getting 200 blood sugar readings. He will increase it by 5 units a day to see if he can get 140-150. He will keep me up dated.

## 2016-08-09 ENCOUNTER — Telehealth: Payer: Self-pay | Admitting: Internal Medicine

## 2016-08-09 NOTE — Telephone Encounter (Signed)
Please see what details you can get on this.  [facility] cautions in the meantime.   Thanks.

## 2016-08-09 NOTE — Telephone Encounter (Signed)
Left detailed message on voicemail to return call with details.

## 2016-08-09 NOTE — Telephone Encounter (Signed)
Spoke with patient who states he did get choked on beef tips which left his throat feeling swollen.  He was hot and sweaty through the night and this morning was cold and clammy but now feels considerably better except his throat still feels swollen.  No fevers, SOB or chest pain.  Patient is able to swallow.  Patient advised to chew his food carefully in the meantime and to keep his appt here on Thursday, July 5th.  Patient also advised if he gets worse, to go to [facility] or ER.

## 2016-08-09 NOTE — Telephone Encounter (Signed)
Pt has appt with Dr Damita Dunnings on 08/11/16 at 4pm.

## 2016-08-09 NOTE — Telephone Encounter (Signed)
Patient Name: JAWANZA ZAMBITO DOB: November 03, 1950 Initial Comment Caller states that her husband is choking when eating and drinking Nurse Assessment Nurse: Ronnald Ramp, RN, Miranda Date/Time (Eastern Time): 08/09/2016 8:22:08 AM Confirm and document reason for call. If symptomatic, describe symptoms. ---Caller states her husband has been having episodes of choking when eating or drinking off and on for the last 3 months. It has been getting worse and had a severe episode last night. Spoke with pt, he stated last night after this happened, it felt like "a gland swollen" in his throat. Which has resolved. Today he is cold and clammy. BS 120. Does the patient have any new or worsening symptoms? ---Yes Will a triage be completed? ---Yes Related visit to physician within the last 2 weeks? ---No Does the PT have any chronic conditions? (i.e. diabetes, asthma, etc.) ---Yes List chronic conditions. ---Diabetes Is this a behavioral health or substance abuse call? ---No Guidelines Guideline Title Affirmed Question Affirmed Notes Choking - Inhaled Foreign Body [1] Choking has occurred 3 or more times in the last year AND [2] the cause is not known Sweating [1] MODERATE sweating (e.g., interferes with normal activities like work or school) AND [2] cause unknown AND [3] new onset in the last 4 weeks Final Disposition User See PCP When Office is Open (within 3 days) Ronnald Ramp, Therapist, sports, Miranda Comments No appt available with PCP. Appt scheduled for 7/5 at 4:00pm with Dr. Damita Dunnings. Disagree/Comply: Comply

## 2016-08-09 NOTE — Telephone Encounter (Signed)
Thanks

## 2016-08-11 ENCOUNTER — Ambulatory Visit (INDEPENDENT_AMBULATORY_CARE_PROVIDER_SITE_OTHER): Payer: BC Managed Care – PPO | Admitting: Family Medicine

## 2016-08-11 ENCOUNTER — Encounter: Payer: Self-pay | Admitting: Family Medicine

## 2016-08-11 VITALS — BP 106/62 | HR 84 | Temp 97.7°F | Wt 229.0 lb

## 2016-08-11 DIAGNOSIS — R1310 Dysphagia, unspecified: Secondary | ICD-10-CM

## 2016-08-11 DIAGNOSIS — R509 Fever, unspecified: Secondary | ICD-10-CM | POA: Diagnosis not present

## 2016-08-11 MED ORDER — INSULIN DEGLUDEC 100 UNIT/ML ~~LOC~~ SOPN: 60.0000 [IU] | PEN_INJECTOR | Freq: Every day | SUBCUTANEOUS | Status: DC

## 2016-08-11 MED ORDER — SILDENAFIL CITRATE 20 MG PO TABS: 60.0000 mg | ORAL_TABLET | Freq: Every day | ORAL | 11 refills | Status: DC | PRN

## 2016-08-11 MED ORDER — OMEPRAZOLE 20 MG PO CPDR: 20.0000 mg | DELAYED_RELEASE_CAPSULE | Freq: Every day | ORAL | 3 refills | Status: DC

## 2016-08-11 MED ORDER — SILDENAFIL CITRATE 20 MG PO TABS: 20.0000 mg | ORAL_TABLET | Freq: Three times a day (TID) | ORAL | 11 refills | Status: DC

## 2016-08-11 NOTE — Patient Instructions (Signed)
If the fevers continue then let us know, along with any other symptoms.  Rosaria Ferries will call about your referral to the GI clinic.  Start taking prilosec 20mg  a day.  Take care.  Glad to see you.

## 2016-08-11 NOTE — Progress Notes (Signed)
Sugar 120-130, occ lower; had inc'd insulin up to 60 units.    Sx started over the last few months.  He has been more likely to "get choked".  He tried to swallow to big of a piece of meat. It can feel like something is getting hung up in his throat when he isn't eating.  No supine or nocturnal sx.  Only heartburn if he eats garlic.  More burping than normal.  No vomiting except when clearing out the piece of meat recently.    The piece of meat was stuck but he was still able to cough and clear the meat bolus.  Never required the heimlich with any events.    No blood in stool, not vomiting blood.  He has had some events where he could clear the esophaus distally.    Rare BC use.  No aleve, no ibuprofen.   Fever up to 100.4 in the last few nights, with sweats during the day or night.  Recently had a mild ST.  this appear to be incidental and unrelated to recent events.  Meds, vitals, and allergies reviewed.   ROS: Per HPI unless specifically indicated in ROS section   GEN: nad, alert and oriented HEENT: mucous membranes moist, OP wnl NECK: supple w/o LA CV: rrr.  no murmur PULM: ctab, no inc wob ABD: soft, +bs EXT: no edema

## 2016-08-12 DIAGNOSIS — R509 Fever, unspecified: Secondary | ICD-10-CM | POA: Insufficient documentation

## 2016-08-12 DIAGNOSIS — R1310 Dysphagia, unspecified: Secondary | ICD-10-CM | POA: Insufficient documentation

## 2016-08-12 NOTE — Assessment & Plan Note (Signed)
He seems to have esophageal symptoms with food sticking in the esophagus after swallowing. It seems that he has symptoms relatively high up in the esophagus but not in the larynx or pharynx to cause true choking. Discussed with patient. He also likely has some component of esophageal spasm versus stricture. He may end up needing dilation. Reasonable to refer to GI for consideration of EGD. Anatomy and rationale discussed with patient. Start Prilosec 20 mg a day. He does likely have concurrent GERD, discussed with patient. Dysphagia cautions, chew food well, do not eat in a hurry, etc. He agrees. >25 minutes spent in face to face time with patient, >50% spent in counselling or coordination of care.

## 2016-08-12 NOTE — Assessment & Plan Note (Signed)
This looks to be incidental and unrelated. Discussed with patient. Only going on for a few days. Benign and nonfocal exam. Could have a mild virus causing symptoms. Observe for now and update me if new symptoms. He agrees. No alarming focal or localizing symptoms or signs on exam at this point.

## 2016-08-23 ENCOUNTER — Encounter: Payer: Self-pay | Admitting: Physician Assistant

## 2016-08-23 ENCOUNTER — Ambulatory Visit (INDEPENDENT_AMBULATORY_CARE_PROVIDER_SITE_OTHER): Payer: BC Managed Care – PPO | Admitting: Physician Assistant

## 2016-08-23 VITALS — BP 130/60 | HR 72 | Ht 67.5 in | Wt 232.5 lb

## 2016-08-23 DIAGNOSIS — R1314 Dysphagia, pharyngoesophageal phase: Secondary | ICD-10-CM | POA: Diagnosis not present

## 2016-08-23 NOTE — Progress Notes (Signed)
Chief Complaint: Dysphagia  HPI:  [name] is a 66 year old Caucasian male with a past medical history of diabetes and others listed below, who was referred to me by Aaron Ghent, MD for a complaint of dysphagia .      Patient previously followed in our clinic with Dr. Olevia Zimmerman and was last seen for a screening colonoscopy in 2015 with a finding of polyps and recommendations for repeat in 5 years.   Today, the patient presents to clinic accompanied by his wife and tells me that for the past year or so he has had an increasing amount of problems when swallowing. He tells me that "I get choked on anything including water". Patient tells me that this was getting worse and increasing in frequency to the point where he had an "episode while out to eat". He was "chewing steak and it got stuck and hung in my throat". Patient tells me typically solid foods tend to "come back up" when they are stuck and liquids tend to gradually go down. He tends to have more of a problem with cold liquids than others. They feel as though they get hung halfway down his throat. He was started on Prilosec 20 mg once daily about 2 weeks ago by his PCP and has been very conscious about taking smaller bites and chewing well and has had less frequent problems over the past 2 weeks. He does tell me he is slightly worried due to his father having esophageal cancer though "he smoked like a freight train". Patient denies overt symptoms of reflux but does tell me that "I burp a lot".   Patient denies a fever, chills, blood in his stool, melena, weight loss, fatigue, anorexia, nausea, vomiting, heartburn, reflux or symptoms that awaken him at night.  Past Medical History:  Diagnosis Date  . Diabetes mellitus ~2006  . Hyperlipidemia   . Personal history of urinary calculi   . Vertigo   . Vitamin B12 deficiency     Past Surgical History:  Procedure Laterality Date  . traumatic injury and repair of RT hand  1974    Current  Outpatient Prescriptions  Medication Sig Dispense Refill  . aspirin 81 MG tablet Take 81 mg by mouth daily.    . cetirizine (ZYRTEC) 10 MG tablet Take 1 tablet (10 mg total) by mouth daily as needed for allergies. 90 tablet 3  . FARXIGA 10 MG TABS tablet TAKE 1 TABLET BY MOUTH DAILY 90 tablet 3  . insulin degludec (TRESIBA FLEXTOUCH) 100 UNIT/ML SOPN FlexTouch Pen Inject 0.6 mLs (60 Units total) into the skin daily.    Marland Kitchen lisinopril (PRINIVIL,ZESTRIL) 10 MG tablet Take 1 tablet (10 mg total) by mouth daily. 90 tablet 3  . metFORMIN (GLUCOPHAGE) 1000 MG tablet TAKE ONE TABLET BY MOUTH TWICE DAILY WITH MEALS. 60 tablet 11  . omeprazole (PRILOSEC) 20 MG capsule Take 1 capsule (20 mg total) by mouth daily. 30 capsule 3  . sildenafil (REVATIO) 20 MG tablet Take 3 tablets (60 mg total) by mouth daily as needed. Prn 50 tablet 11  . simvastatin (ZOCOR) 20 MG tablet TAKE ONE (1) TABLET BY MOUTH EVERY NIGHTAT BEDTIME 90 tablet 3   No current facility-administered medications for this visit.     Allergies as of 08/23/2016 - Review Complete 08/23/2016  Allergen Reaction Noted  . Pioglitazone Rash 01/12/2009    Family History  Problem Relation Age of Onset  . Heart disease Mother  CAD/PCI with stents; cardiomyopathy with AICD  . Cancer Other        Colon and Prostate  . Colon cancer Neg Hx     Social History   Social History  . Marital status: Married    Spouse name: N/A  . Number of children: 2  . Years of education: N/A   Occupational History  . 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 Smoking status: Never Smoker  . Smokeless tobacco: Former Systems developer    Types: Chew    Quit date: 02/08/1995  . Alcohol use No  . Drug use: No  . Sexual activity: Yes   Other Topics Concern  . Not on file   Social History Narrative   HSG   Married 1979   1 son 29 1 daughter 66; 1 granddaughter Aaron Zimmerman '07), 1 grandson (Aaron Zimmerman '09)   Work:  [employer]   Regular exercise: walking when he can   Caffeine use: coffee all day    Review of Systems:    Constitutional: No weight loss, fever or chills Skin: No rash Cardiovascular: No chest pain Respiratory: No SOB Gastrointestinal: See HPI and otherwise negative Genitourinary: No dysuria  Neurological: No headache Musculoskeletal: No new muscle or joint pain Hematologic: No bleeding  Psychiatric: No history of depression or anxiety   Physical Exam:  Vital signs: BP 130/60   Pulse 72   Ht 5' 7.5" (1.715 m)   Wt 232 lb 8 oz (105.5 kg)   BMI 35.88 kg/m   Constitutional:   Pleasant overweight Caucasian male appears to be in NAD, Well developed, Well nourished, alert and cooperative Head:  Normocephalic and atraumatic. Eyes:   PEERL, EOMI. No icterus. Conjunctiva pink. Ears:  Normal auditory acuity. Neck:  Supple Throat: Oral cavity and pharynx without inflammation, swelling or lesion.  Respiratory: Respirations even and unlabored. Lungs clear to auscultation bilaterally.   No wheezes, crackles, or rhonchi.  Cardiovascular: Normal S1, S2. No MRG. Regular rate and rhythm. No peripheral edema, cyanosis or pallor.  Gastrointestinal: Diastasis Recti Soft, nondistended, nontender. No rebound or guarding. Normal bowel sounds. No appreciable masses or hepatomegaly. Rectal:  Not performed.  Msk:  Symmetrical without gross deformities. Without edema, no deformity or joint abnormality.  Neurologic:  Alert and  oriented x4;  grossly normal neurologically.  Skin:   Dry and intact without significant lesions or rashes. Psychiatric:  Demonstrates good judgement and reason without abnormal affect or behaviors.  No recent labs or imaging.  Assessment: 1. Dysphagia: Symptoms for the past year, worse over the past few months, with solids and liquids, seem to go down and get "stuck", some regurgitation; consider dysmotility versus esophageal stricture versus ring versus web  versus mass  Plan: 1. Ordered an esophagram with barium tablet. If this shows a structural abnormality will recommend an endoscopy. We did discuss this procedure today including risks, benefits, limitations and alternatives. The patient agrees to proceed if necessary. 2. The procedure would be scheduled with Dr. Silverio Decamp in the [facility] 3. Reviewed anti-dysphagia measures including taking small bites, drinking sips of water, taking small bites, avoiding distraction while eating and the chin tuck technique. 4. Patient continues Prilosec 20 mg once daily, 30-60 minutes before eating. If endoscopy reveals severe disease, could discuss increasing this medication. 5. Patient to follow in our clinic per recommendations after barium swallow above. This will be with Dr. Silverio Decamp or myself  Ellouise Newer, PA-C [facility] 08/23/2016, 9:02 AM  Cc: Aaron Ghent, MD

## 2016-08-23 NOTE — Patient Instructions (Signed)
You have been scheduled for a Barium Esophogram at [facility] (1st floor of the hospital) on Monday 09-05-2016 at 11:00 am. Please arrive at 10:45 am to your appointment for registration. Make certain not to have anything to eat or drink 6 hours prior to your test. If you need to reschedule for any reason, please contact radiology at [phone] to do so. __________________________________________________________________ A barium swallow is an examination that concentrates on views of the esophagus. This tends to be a double contrast exam (barium and two liquids which, when combined, create a gas to distend the wall of the oesophagus) or single contrast (non-ionic iodine based). The study is usually tailored to your symptoms so a good history is essential. Attention is paid during the study to the form, structure and configuration of the esophagus, looking for functional disorders (such as aspiration, dysphagia, achalasia, motility and reflux) EXAMINATION You may be asked to change into a gown, depending on the type of swallow being performed. A radiologist and radiographer will perform the procedure. The radiologist will advise you of the type of contrast selected for your procedure and direct you during the exam. You will be asked to stand, sit or lie in several different positions and to hold a small amount of fluid in your mouth before being asked to swallow while the imaging is performed .In some instances you may be asked to swallow barium coated marshmallows to assess the motility of a solid food bolus. The exam can be recorded as a digital or video fluoroscopy procedure. POST PROCEDURE It will take 1-2 days for the barium to pass through your system. To facilitate this, it is important, unless otherwise directed, to increase your fluids for the next 24-48hrs and to resume your normal diet.  This test typically takes about 30 minutes to perform.  You will be called with the  results. Follow up with Dr. Silverio Decamp as needed.  __________________________________________________________________________________

## 2016-08-24 NOTE — Progress Notes (Signed)
Reviewed and agree with documentation and assessment and plan. K. Veena Nandigam , MD   

## 2016-09-05 ENCOUNTER — Ambulatory Visit (HOSPITAL_COMMUNITY)
Admission: RE | Admit: 2016-09-05 | Discharge: 2016-09-05 | Disposition: A | Discharge status: Home / Self Care | Payer: BC Managed Care – PPO | Source: Ambulatory Visit | Attending: Physician Assistant | Admitting: Physician Assistant

## 2016-09-05 ENCOUNTER — Telehealth: Payer: Self-pay | Admitting: Physician Assistant

## 2016-09-05 DIAGNOSIS — K449 Diaphragmatic hernia without obstruction or gangrene: Secondary | ICD-10-CM | POA: Diagnosis not present

## 2016-09-05 DIAGNOSIS — R1314 Dysphagia, pharyngoesophageal phase: Secondary | ICD-10-CM | POA: Diagnosis not present

## 2016-09-05 NOTE — Telephone Encounter (Signed)
Patient notified that Aaron Zimmerman, [place] is waiting for  weigh in on next step and that we will call back when she has reviewed.

## 2016-09-13 NOTE — Telephone Encounter (Signed)
OK for EGD. Barium esophagram was normal, he may or may not have a stricture, but we can take a look to decide next step. Thanks-JLL

## 2016-09-13 NOTE — Telephone Encounter (Signed)
The pt has been scheduled for pre visit and EGD with Dr Silverio Decamp.  The pt is aware

## 2016-09-13 NOTE — Telephone Encounter (Signed)
Aaron Zimmerman have you reviewed for next step?

## 2016-09-30 ENCOUNTER — Ambulatory Visit (AMBULATORY_SURGERY_CENTER): Payer: Self-pay | Admitting: *Deleted

## 2016-09-30 VITALS — Ht 70.5 in | Wt 233.0 lb

## 2016-09-30 DIAGNOSIS — R1314 Dysphagia, pharyngoesophageal phase: Secondary | ICD-10-CM

## 2016-09-30 NOTE — Progress Notes (Signed)
No allergies to eggs or soy. No problems with anesthesia.  Pt given Emmi instructions for EGD  No oxygen use  No diet drug use  

## 2016-10-07 ENCOUNTER — Encounter: Payer: Self-pay | Admitting: Gastroenterology

## 2016-10-07 ENCOUNTER — Ambulatory Visit (AMBULATORY_SURGERY_CENTER): Payer: BC Managed Care – PPO | Admitting: Gastroenterology

## 2016-10-07 VITALS — BP 118/70 | HR 67 | Temp 98.0°F | Resp 22 | Ht 70.0 in | Wt 233.0 lb

## 2016-10-07 DIAGNOSIS — K319 Disease of stomach and duodenum, unspecified: Secondary | ICD-10-CM

## 2016-10-07 DIAGNOSIS — R1314 Dysphagia, pharyngoesophageal phase: Secondary | ICD-10-CM

## 2016-10-07 MED ORDER — SODIUM CHLORIDE 0.9 % IV SOLN: 500.0000 mL | INTRAVENOUS | Status: DC

## 2016-10-07 NOTE — Op Note (Addendum)
[place] Patient Name: Aaron Zimmerman Procedure Date: 10/07/2016 1:22 PM MRN: [id number] Endoscopist: Mauri Pole , MD Age: 66 Referring MD:  Date of Birth: [birth year] Gender: Male Account #: [id number] Procedure:                Upper GI endoscopy Indications:              Dysphagia Medicines:                Monitored Anesthesia Care Procedure:                Pre-Anesthesia Assessment:                           - Prior to the procedure, a History and Physical                            was performed, and patient medications and                            allergies were reviewed. The patient's tolerance of                            previous anesthesia was also reviewed. The risks                            and benefits of the procedure and the sedation                            options and risks were discussed with the patient.                            All questions were answered, and informed consent                            was obtained. Prior Anticoagulants: The patient has                            taken no previous anticoagulant or antiplatelet                            agents. ASA Grade Assessment: II - A patient with                            mild systemic disease. After reviewing the risks                            and benefits, the patient was deemed in                            satisfactory condition to undergo the procedure.                           After obtaining informed consent, the endoscope was  passed under direct vision. Throughout the                            procedure, the patient's blood pressure, pulse, and                            oxygen saturations were monitored continuously. The                            Model GIF-HQ190 [id number]) scope was introduced                            through the mouth, and advanced to the second part                            of duodenum. The upper GI endoscopy  was                            accomplished without difficulty. The patient                            tolerated the procedure well. Scope In: Scope Out: Findings:                 A small hiatal hernia was present. No visible                            stricture. Esophaeal mucosa appeared normal.                           Scattered mild inflammation characterized by                            congestion (edema), erythema and friability was                            found in the gastric antrum. Biopsies were taken                            with a cold forceps for Helicobacter pylori testing.                           The examined duodenum was normal. Complications:            No immediate complications. Estimated Blood Loss:     Estimated blood loss was minimal. Impression:               - Small hiatal hernia.                           - Gastritis. Biopsied.                           - Normal examined duodenum. Recommendation:           - Patient has a contact number available for  emergencies. The signs and symptoms of potential                            delayed complications were discussed with the                            patient. Return to normal activities tomorrow.                            Written discharge instructions were provided to the                            patient.                           - Resume previous diet.                           - Continue present medications.                           - Await pathology results.                           - Schedule for FEES by speech and swallow                            pathologist and modified barium swallow to evaluate                            for possible oropharyngeal dysphagia Mauri Pole, MD 10/07/2016 1:40:26 PM This report has been signed electronically.

## 2016-10-07 NOTE — Patient Instructions (Signed)
  Handout given: Gastritis.    YOU HAD AN ENDOSCOPIC PROCEDURE TODAY AT [facility]:   Refer to the procedure report that was given to you for any specific questions about what was found during the examination.  If the procedure report does not answer your questions, please call your gastroenterologist to clarify.  If you requested that your care partner not be given the details of your procedure findings, then the procedure report has been included in a sealed envelope for you to review at your convenience later.  YOU SHOULD EXPECT: Some feelings of bloating in the abdomen. Passage of more gas than usual.  Walking can help get rid of the air that was put into your GI tract during the procedure and reduce the bloating. If you had a lower endoscopy (such as a colonoscopy or flexible sigmoidoscopy) you may notice spotting of blood in your stool or on the toilet paper. If you underwent a bowel prep for your procedure, you may not have a normal bowel movement for a few days.  Please Note:  You might notice some irritation and congestion in your nose or some drainage.  This is from the oxygen used during your procedure.  There is no need for concern and it should clear up in a day or so.  SYMPTOMS TO REPORT IMMEDIATELY:    Following upper endoscopy (EGD)  Vomiting of blood or coffee ground material  New chest pain or pain under the shoulder blades  Painful or persistently difficult swallowing  New shortness of breath  Fever of 100F or higher  Black, tarry-looking stools  For urgent or emergent issues, a gastroenterologist can be reached at any hour by calling [phone].   DIET:  We do recommend a small meal at first, but then you may proceed to your regular diet.  Drink plenty of fluids but you should avoid alcoholic beverages for 24 hours.  ACTIVITY:  You should plan to take it easy for the rest of today and you should NOT DRIVE or use heavy machinery until tomorrow  (because of the sedation medicines used during the test).    FOLLOW UP: Our staff will call the number listed on your records the next business day following your procedure to check on you and address any questions or concerns that you may have regarding the information given to you following your procedure. If we do not reach you, we will leave a message.  However, if you are feeling well and you are not experiencing any problems, there is no need to return our call.  We will assume that you have returned to your regular daily activities without incident.  If any biopsies were taken you will be contacted by phone or by letter within the next 1-3 weeks.  Please call us at [phone] if you have not heard about the biopsies in 3 weeks.    SIGNATURES/CONFIDENTIALITY: You and/or your care partner have signed paperwork which will be entered into your electronic medical record.  These signatures attest to the fact that that the information above on your After Visit Summary has been reviewed and is understood.  Full responsibility of the confidentiality of this discharge information lies with you and/or your care-partner.

## 2016-10-07 NOTE — Progress Notes (Signed)
Called to room to assist during endoscopic procedure.  Patient ID and intended procedure confirmed with present staff. Received instructions for my participation in the procedure from the performing physician.  

## 2016-10-07 NOTE — Progress Notes (Signed)
Report to PACU, RN, vss, BBS= Clear.  

## 2016-10-07 NOTE — Progress Notes (Signed)
Pt's states no medical or surgical changes since previsit or office visit. 

## 2016-10-11 ENCOUNTER — Telehealth: Payer: Self-pay | Admitting: *Deleted

## 2016-10-11 NOTE — Telephone Encounter (Signed)
No answer, message left for the patient. 

## 2016-10-11 NOTE — Telephone Encounter (Signed)
Left message on 2nd f/u callback 

## 2016-10-11 NOTE — Telephone Encounter (Signed)
Patient calling back states he is doing fine and does not need a call back.

## 2016-10-13 ENCOUNTER — Telehealth: Payer: Self-pay | Admitting: Gastroenterology

## 2016-10-13 ENCOUNTER — Other Ambulatory Visit: Payer: Self-pay

## 2016-10-13 DIAGNOSIS — R1310 Dysphagia, unspecified: Secondary | ICD-10-CM

## 2016-10-13 NOTE — Telephone Encounter (Signed)
Speech pathologist recommends starting with a modified barium swallow. I have ordered this. Is this okay with you?

## 2016-10-13 NOTE — Telephone Encounter (Signed)
Ok thanks 

## 2016-10-14 ENCOUNTER — Other Ambulatory Visit (HOSPITAL_COMMUNITY): Payer: Self-pay | Admitting: Gastroenterology

## 2016-10-14 ENCOUNTER — Other Ambulatory Visit: Payer: Self-pay

## 2016-10-14 DIAGNOSIS — R1319 Other dysphagia: Secondary | ICD-10-CM

## 2016-10-14 MED ORDER — BIS SUBCIT-METRONID-TETRACYC 140-125-125 MG PO CAPS: 3.0000 | ORAL_CAPSULE | Freq: Three times a day (TID) | ORAL | 0 refills | Status: DC

## 2016-10-14 MED ORDER — OMEPRAZOLE 40 MG PO CPDR: 40.0000 mg | DELAYED_RELEASE_CAPSULE | Freq: Two times a day (BID) | ORAL | 0 refills | Status: DC

## 2016-10-14 NOTE — Telephone Encounter (Signed)
Acute Rehab has contacted [facility] with the appointment and instructions.

## 2016-10-18 ENCOUNTER — Encounter: Payer: Self-pay | Admitting: Family Medicine

## 2016-10-18 ENCOUNTER — Ambulatory Visit (INDEPENDENT_AMBULATORY_CARE_PROVIDER_SITE_OTHER): Payer: BC Managed Care – PPO | Admitting: Family Medicine

## 2016-10-18 ENCOUNTER — Encounter: Payer: Self-pay | Admitting: Internal Medicine

## 2016-10-18 ENCOUNTER — Ambulatory Visit: Payer: BC Managed Care – PPO | Admitting: Family Medicine

## 2016-10-18 DIAGNOSIS — B9789 Other viral agents as the cause of diseases classified elsewhere: Secondary | ICD-10-CM

## 2016-10-18 DIAGNOSIS — J069 Acute upper respiratory infection, unspecified: Secondary | ICD-10-CM

## 2016-10-18 MED ORDER — GUAIFENESIN-CODEINE 100-10 MG/5ML PO SYRP: 5.0000 mL | ORAL_SOLUTION | Freq: Every evening | ORAL | 0 refills | Status: DC | PRN

## 2016-10-18 NOTE — Assessment & Plan Note (Signed)
Symptomatic care. Cough suppressant for rest at night.

## 2016-10-18 NOTE — Patient Instructions (Addendum)
Mucinex DM during the day.  Prescription cough suppressant at night. Tylenol for bodyache and headache  Rest, fluids.  Call if not improving in 5-7 days, por sooner if shortness of breath.

## 2016-10-18 NOTE — Progress Notes (Signed)
   Subjective:    Patient ID: Aaron Zimmerman, male    DOB: [birth year], 66 y.o.   MRN: [id number]  Fever   Pertinent negatives include no wheezing.  Cough  This is a new problem. The current episode started in the past 7 days (4 days). The problem has been gradually worsening. The problem occurs constantly. The cough is productive of sputum. Associated symptoms include chills, myalgias and nasal congestion. Pertinent negatives include no ear congestion, shortness of breath or wheezing. Associated symptoms comments: Fever 99.0-100.0 F yesterday  sinus pressure. The symptoms are aggravated by lying down. Risk factors: nonsmoker. Treatments tried: mucinex, allegra. The treatment provided mild relief. There is no history of asthma, bronchiectasis, COPD or environmental allergies.   He is currently on  Pylera  For Hpylori   Blood pressure 102/70, pulse 88, temperature 97.6 F (36.4 C), temperature source Oral, height 5' 7.5" (1.715 m), weight 224 lb 12 oz (101.9 kg).  Review of Systems  Constitutional: Positive for chills.  Respiratory: Negative for shortness of breath and wheezing.   Musculoskeletal: Positive for myalgias.  Allergic/Immunologic: Negative for environmental allergies.       Objective:   Physical Exam  Constitutional: Vital signs are normal. He appears well-developed and well-nourished.  Non-toxic appearance. He does not appear ill. No distress.  HENT:  Head: Normocephalic and atraumatic.  Right Ear: Hearing, tympanic membrane, external ear and ear canal normal. No tenderness. No foreign bodies. Tympanic membrane is not retracted and not bulging.  Left Ear: Hearing, tympanic membrane, external ear and ear canal normal. No tenderness. No foreign bodies. Tympanic membrane is not retracted and not bulging.  Nose: Nose normal. No mucosal edema or rhinorrhea. Right sinus exhibits no maxillary sinus tenderness and no frontal sinus tenderness. Left sinus exhibits no maxillary  sinus tenderness and no frontal sinus tenderness.  Mouth/Throat: Uvula is midline, oropharynx is clear and moist and mucous membranes are normal. Normal dentition. No dental caries. No oropharyngeal exudate or tonsillar abscesses.  Eyes: Pupils are equal, round, and reactive to light. Conjunctivae, EOM and lids are normal. Lids are everted and swept, no foreign bodies found.  Neck: Trachea normal, normal range of motion and phonation normal. Neck supple. Carotid bruit is not present. No thyroid mass and no thyromegaly present.  Cardiovascular: Normal rate, regular rhythm, S1 normal, S2 normal, normal heart sounds, intact distal pulses and normal pulses.  Exam reveals no gallop.   No murmur heard. Pulmonary/Chest: Effort normal and breath sounds normal. No respiratory distress. He has no wheezes. He has no rhonchi. He has no rales.  Abdominal: Soft. Normal appearance and bowel sounds are normal. There is no hepatosplenomegaly. There is no tenderness. There is no rebound, no guarding and no CVA tenderness. No hernia.  Neurological: He is alert. He has normal reflexes.  Skin: Skin is warm, dry and intact. No rash noted.  Psychiatric: He has a normal mood and affect. His speech is normal and behavior is normal. Judgment normal.          Assessment & Plan:

## 2016-10-19 ENCOUNTER — Ambulatory Visit (HOSPITAL_COMMUNITY)
Admission: RE | Admit: 2016-10-19 | Discharge: 2016-10-19 | Disposition: A | Discharge status: Home / Self Care | Payer: BC Managed Care – PPO | Source: Ambulatory Visit | Attending: Gastroenterology | Admitting: Gastroenterology

## 2016-10-19 DIAGNOSIS — R1310 Dysphagia, unspecified: Secondary | ICD-10-CM | POA: Diagnosis present

## 2016-10-19 DIAGNOSIS — R1313 Dysphagia, pharyngeal phase: Secondary | ICD-10-CM | POA: Insufficient documentation

## 2016-10-19 DIAGNOSIS — R1319 Other dysphagia: Secondary | ICD-10-CM

## 2016-10-24 ENCOUNTER — Other Ambulatory Visit: Payer: Self-pay | Admitting: Internal Medicine

## 2016-10-25 NOTE — Telephone Encounter (Signed)
Approved: okay x 1 year 

## 2016-10-25 NOTE — Telephone Encounter (Signed)
Last labs 04/2015. Pt did not complete labs at 04/2016 CPE. pls advise

## 2016-11-06 ENCOUNTER — Encounter (HOSPITAL_COMMUNITY): Payer: Self-pay | Admitting: Emergency Medicine

## 2016-11-06 DIAGNOSIS — Z7984 Long term (current) use of oral hypoglycemic drugs: Secondary | ICD-10-CM | POA: Insufficient documentation

## 2016-11-06 DIAGNOSIS — R319 Hematuria, unspecified: Secondary | ICD-10-CM | POA: Diagnosis present

## 2016-11-06 DIAGNOSIS — E119 Type 2 diabetes mellitus without complications: Secondary | ICD-10-CM | POA: Diagnosis not present

## 2016-11-06 DIAGNOSIS — R310 Gross hematuria: Secondary | ICD-10-CM | POA: Diagnosis not present

## 2016-11-06 DIAGNOSIS — N200 Calculus of kidney: Secondary | ICD-10-CM | POA: Insufficient documentation

## 2016-11-06 DIAGNOSIS — Z79899 Other long term (current) drug therapy: Secondary | ICD-10-CM | POA: Insufficient documentation

## 2016-11-06 DIAGNOSIS — Z7982 Long term (current) use of aspirin: Secondary | ICD-10-CM | POA: Diagnosis not present

## 2016-11-06 DIAGNOSIS — Z87891 Personal history of nicotine dependence: Secondary | ICD-10-CM | POA: Diagnosis not present

## 2016-11-06 DIAGNOSIS — N281 Cyst of kidney, acquired: Secondary | ICD-10-CM | POA: Insufficient documentation

## 2016-11-06 LAB — URINALYSIS, ROUTINE W REFLEX MICROSCOPIC
BILIRUBIN URINE: NEGATIVE
Bacteria, UA: NONE SEEN
KETONES UR: 5 mg/dL — AB
LEUKOCYTES UA: NEGATIVE
NITRITE: NEGATIVE
PH: 5 (ref 5.0–8.0)
Protein, ur: NEGATIVE mg/dL
SPECIFIC GRAVITY, URINE: 1.031 — AB (ref 1.005–1.030)
WBC, UA: NONE SEEN WBC/hpf (ref 0–5)

## 2016-11-06 NOTE — ED Triage Notes (Signed)
Patient reports one episode of bright red blood in the urine without clots. Denies abdominal pain, N/V/D. Hx kidney stones.

## 2016-11-07 ENCOUNTER — Emergency Department (HOSPITAL_COMMUNITY): Payer: BC Managed Care – PPO

## 2016-11-07 ENCOUNTER — Emergency Department (HOSPITAL_COMMUNITY)
Admission: EM | Admit: 2016-11-07 | Discharge: 2016-11-07 | Disposition: A | Discharge status: Home / Self Care | Payer: BC Managed Care – PPO | Attending: Emergency Medicine | Admitting: Emergency Medicine

## 2016-11-07 ENCOUNTER — Encounter (HOSPITAL_COMMUNITY): Payer: Self-pay | Admitting: Radiology

## 2016-11-07 DIAGNOSIS — R310 Gross hematuria: Secondary | ICD-10-CM

## 2016-11-07 DIAGNOSIS — N281 Cyst of kidney, acquired: Secondary | ICD-10-CM

## 2016-11-07 DIAGNOSIS — N200 Calculus of kidney: Secondary | ICD-10-CM

## 2016-11-07 LAB — CBC WITH DIFFERENTIAL/PLATELET
BASOS ABS: 0 10*3/uL (ref 0.0–0.1)
Basophils Relative: 1 %
EOS ABS: 0.1 10*3/uL (ref 0.0–0.7)
EOS PCT: 2 %
HCT: 47.6 % (ref 39.0–52.0)
HEMOGLOBIN: 15.8 g/dL (ref 13.0–17.0)
LYMPHS PCT: 25 %
Lymphs Abs: 1.5 10*3/uL (ref 0.7–4.0)
MCH: 28.3 pg (ref 26.0–34.0)
MCHC: 33.2 g/dL (ref 30.0–36.0)
MCV: 85.3 fL (ref 78.0–100.0)
Monocytes Absolute: 0.4 10*3/uL (ref 0.1–1.0)
Monocytes Relative: 7 %
NEUTROS PCT: 65 %
Neutro Abs: 3.9 10*3/uL (ref 1.7–7.7)
PLATELETS: 174 10*3/uL (ref 150–400)
RBC: 5.58 MIL/uL (ref 4.22–5.81)
RDW: 13.5 % (ref 11.5–15.5)
WBC: 5.9 10*3/uL (ref 4.0–10.5)

## 2016-11-07 LAB — COMPREHENSIVE METABOLIC PANEL
ALT: 23 U/L (ref 17–63)
AST: 34 U/L (ref 15–41)
Albumin: 4.3 g/dL (ref 3.5–5.0)
Alkaline Phosphatase: 60 U/L (ref 38–126)
Anion gap: 8 (ref 5–15)
BUN: 13 mg/dL (ref 6–20)
CHLORIDE: 104 mmol/L (ref 101–111)
CO2: 28 mmol/L (ref 22–32)
CREATININE: 1.08 mg/dL (ref 0.61–1.24)
Calcium: 9.3 mg/dL (ref 8.9–10.3)
GFR calc Af Amer: >60 mL/min (ref >60)
GFR calc non Af Amer: >60 mL/min (ref >60)
Glucose, Bld: 200 mg/dL — ABNORMAL HIGH (ref 65–99)
Potassium: 4.7 mmol/L (ref 3.5–5.1)
SODIUM: 140 mmol/L (ref 135–145)
Total Bilirubin: 1.3 mg/dL — ABNORMAL HIGH (ref 0.3–1.2)
Total Protein: 7.3 g/dL (ref 6.5–8.1)

## 2016-11-07 MED ORDER — IOPAMIDOL (ISOVUE-300) INJECTION 61%
100.0000 mL | Freq: Once | INTRAVENOUS | Status: AC | PRN
  Administered 2016-11-07: 100 mL via INTRAVENOUS

## 2016-11-07 MED ORDER — TAMSULOSIN HCL 0.4 MG PO CAPS: 0.4000 mg | ORAL_CAPSULE | Freq: Every day | ORAL | 0 refills | Status: DC

## 2016-11-07 MED ORDER — SODIUM CHLORIDE 0.9 % IV BOLUS (SEPSIS)
1000.0000 mL | Freq: Once | INTRAVENOUS | Status: AC
  Administered 2016-11-07: 1000 mL via INTRAVENOUS

## 2016-11-07 MED ORDER — IOPAMIDOL (ISOVUE-300) INJECTION 61%
INTRAVENOUS | Status: AC
  Administered 2016-11-07: 100 mL via INTRAVENOUS
  Filled 2016-11-07: qty 100

## 2016-11-07 MED ORDER — DEXTROSE 5 % IV SOLN: 1.0000 g | Freq: Once | INTRAVENOUS | Status: DC

## 2016-11-07 NOTE — Discharge Instructions (Signed)
Take flomax daily.   Expect some blood in urine. Drink plenty of fluids   Call urology for follow up in a week.   Return to ER if you have severe flank pain, abdominal pain, unable to urinate.

## 2016-11-07 NOTE — ED Notes (Signed)
Patient denies pain and is resting comfortably.  

## 2016-11-07 NOTE — ED Notes (Signed)
Patient transported to CT 

## 2016-11-07 NOTE — ED Provider Notes (Signed)
[facility] DEPT Provider Note   CSN: [id number] Arrival date & time: 11/06/16  2228     History   Chief Complaint Chief Complaint  Patient presents with  . Hematuria    HPI Aaron Zimmerman is a 66 y.o. male history of diabetes, hyperlipidemia, here presenting with bright red blood in the urine. Patient states that he was having sex with his wife and thought that he had premature ejaculation and then had bright red blood in his urine. He states that there are a lot of clots in it. He denies any flank pain or any bladder spasms. Patient has a history of kidney stones about 15-20 years ago. Has no history of bladder cancer. No fever or dysuria.  The history is provided by the patient.    Past Medical History:  Diagnosis Date  . Allergy   . Diabetes mellitus ~2006  . Hyperlipidemia   . Personal history of urinary calculi   . Vertigo   . Vitamin B12 deficiency     Patient Active Problem List   Diagnosis Date Noted  . Fever, unspecified 08/12/2016  . Dysphagia 08/12/2016  . Dental infection 11/06/2015  . Vitamin B12 deficiency   . Viral URI with cough 04/13/2015  . Erectile dysfunction associated with type 2 diabetes mellitus ([place]) 07/14/2014  . Routine health maintenance 03/09/2013  . Polyp of colon, adenomatous 03/06/2013  . Solitary pulmonary nodule 07/22/2012  . B12 deficiency 03/01/2010  . ALLERGIC RHINITIS, SEASONAL, MILD 04/28/2009  . ONYCHOMYCOSIS, TOENAILS 03/02/2009  . Diabetes mellitus with circulatory complication ([place]) 84/13/2440  . Hyperlipemia 01/22/2007    Past Surgical History:  Procedure Laterality Date  . traumatic injury and repair of RT hand  1974       Home Medications    Prior to Admission medications   Medication Sig Start Date End Date Taking? Authorizing Provider  aspirin 81 MG tablet Take 81 mg by mouth daily.   Yes [provider]  bismuth-metronidazole-tetracycline (PYLERA) [phone] MG capsule Take 3 capsules by  mouth 4 (four) times daily -  before meals and at bedtime. 10/14/16  Yes Nandigam, Venia Minks, MD  cetirizine (ZYRTEC) 10 MG tablet Take 1 tablet (10 mg total) by mouth daily as needed for allergies. 05/04/16  Yes Venia Carbon, MD  Cyanocobalamin (B-12 PO) Take 1 tablet by mouth daily.    Yes [provider]  FARXIGA 10 MG TABS tablet TAKE 1 TABLET BY MOUTH DAILY 03/14/16  Yes Venia Carbon, MD  guaiFENesin-codeine (ROBITUSSIN AC) 100-10 MG/5ML syrup Take 5-10 mLs by mouth at bedtime as needed for cough. 10/18/16  Yes Bedsole, Amy E, MD  insulin degludec (TRESIBA FLEXTOUCH) 100 UNIT/ML SOPN FlexTouch Pen Inject 0.6 mLs (60 Units total) into the skin daily. Patient taking differently: Inject 50 Units into the skin daily.  08/11/16  Yes Tonia Ghent, MD  lisinopril (PRINIVIL,ZESTRIL) 10 MG tablet Take 1 tablet (10 mg total) by mouth daily. 05/04/16  Yes Venia Carbon, MD  metFORMIN (GLUCOPHAGE) 1000 MG tablet TAKE ONE TABLET BY MOUTH TWICE DAILY WITH MEALS. 06/06/16  Yes Venia Carbon, MD  omeprazole (PRILOSEC) 40 MG capsule Take 1 capsule (40 mg total) by mouth 2 (two) times daily. 10/14/16  Yes Nandigam, Venia Minks, MD  simvastatin (ZOCOR) 20 MG tablet TAKE ONE TABLET BY MOUTH EVERY NIGHT AT BEDTIME 10/25/16  Yes Viviana Simpler I, MD  sildenafil (REVATIO) 20 MG tablet Take 3 tablets (60 mg total) by mouth daily as  needed. Prn 08/11/16   Tonia Ghent, MD    Family History Family History  Problem Relation Age of Onset  . Heart disease Mother        CAD/PCI with stents; cardiomyopathy with AICD  . Cancer Other        Colon and Prostate  . Colon cancer Neg Hx     Social History Social History  Substance Use Topics  . Smoking status: Never Smoker  . Smokeless tobacco: Former Systems developer    Types: Chew    Quit date: 02/08/1995  . Alcohol use No     Allergies   Pioglitazone   Review of Systems Review of Systems  Genitourinary: Positive for hematuria.  All other systems  reviewed and are negative.    Physical Exam Updated Vital Signs BP 128/86 (BP Location: Left Arm)   Pulse 76   Temp 97.8 F (36.6 C) (Oral)   Resp 18   Ht 5' 7.5" (1.715 m)   Wt 102.5 kg (226 lb)   SpO2 98%   BMI 34.87 kg/m   Physical Exam  Constitutional: He is oriented to person, place, and time. He appears well-developed and well-nourished.  HENT:  Head: Normocephalic.  Left Ear: External ear normal.  Mouth/Throat: Oropharynx is clear and moist.  Eyes: Pupils are equal, round, and reactive to light. Conjunctivae and EOM are normal.  Neck: Normal range of motion. Neck supple.  Cardiovascular: Normal rate, regular rhythm and normal heart sounds.   Pulmonary/Chest: Effort normal and breath sounds normal. No respiratory distress. He has no wheezes. He has no rales.  Abdominal: Soft. Bowel sounds are normal. He exhibits no distension. There is no tenderness. There is no guarding.  No CVAT, no suprapubic tenderness   Musculoskeletal: Normal range of motion.  Neurological: He is alert and oriented to person, place, and time. He displays normal reflexes. No cranial nerve deficit. Coordination normal.  Skin: Skin is warm.  Psychiatric: He has a normal mood and affect.  Nursing note and vitals reviewed.    ED Treatments / Results  Labs (all labs ordered are listed, but only abnormal results are displayed) Labs Reviewed  URINALYSIS, ROUTINE W REFLEX MICROSCOPIC - Abnormal; Notable for the following:       Result Value   APPearance HAZY (*)    Specific Gravity, Urine 1.031 (*)    Glucose, UA >=500 (*)    Hgb urine dipstick LARGE (*)    Ketones, ur 5 (*)    Squamous Epithelial / LPF 0-5 (*)    All other components within normal limits  COMPREHENSIVE METABOLIC PANEL - Abnormal; Notable for the following:    Glucose, Bld 200 (*)    Total Bilirubin 1.3 (*)    All other components within normal limits  URINE CULTURE  CBC WITH DIFFERENTIAL/PLATELET    EKG  EKG  Interpretation None       Radiology Ct Abdomen Pelvis W Wo Contrast  Result Date: 11/07/2016 CLINICAL DATA:  66 year old male with history of bright red blood in the urine. EXAM: CT ABDOMEN AND PELVIS WITHOUT AND WITH CONTRAST TECHNIQUE: Multidetector CT imaging of the abdomen and pelvis was performed following the standard protocol before and following the bolus administration of intravenous contrast. CONTRAST:  100 mL of Isovue 300. COMPARISON:  CT the abdomen and pelvis 06/03/2010. FINDINGS: Lower chest: Elevation of the left hemidiaphragm. Areas of subsegmental atelectasis in the inferior segment of the lingula and in the left lower lobe. Calcifications of the left anterior  descending and right coronary arteries. Hepatobiliary: Diffuse low attenuation throughout the hepatic parenchyma, indicative of a background of severe hepatic steatosis. No suspicious cystic or solid hepatic lesions. No intra or extrahepatic biliary ductal dilatation. Gallbladder is normal in appearance. Pancreas: No pancreatic mass. No pancreatic ductal dilatation. No pancreatic or peripancreatic fluid or inflammatory changes. Spleen: Well-defined low-attenuation lesions in the spleen measuring up to 2.4 cm in diameter, stable compared to prior study from 2012, incompletely characterized but likely to represent small cysts. Adrenals/Urinary Tract: Tiny calcifications are noted within the collecting systems of both kidneys measuring up to 4 mm in the upper pole collecting system of left kidney. No additional calculi are noted along the course of either ureter or within the lumen of the urinary bladder. No hydroureteronephrosis or perinephric stranding is noted to suggest urinary tract obstruction at this time. 11 mm low-attenuation lesion in the lateral aspect of the lower pole of the right kidney is compatible with a simple cyst. Other subcentimeter low-attenuation lesion in the lower pole the right kidney is too small to  definitively characterize, but is statistically likely a tiny cyst. No suspicious appearing renal lesions. On postcontrast delayed images there are no filling defects within the collecting system of either kidney, along the course of either ureter, or within the lumen of the urinary bladder to strongly suggest presence of a urothelial neoplasm at this time. Urinary bladder is normal in appearance. Stomach/Bowel: Normal appearance of the stomach. No pathologic dilatation of small bowel or colon. Normal appendix. Vascular/Lymphatic: Aortic atherosclerosis, without evidence of aneurysm or dissection in the abdominal or pelvic vasculature. No lymphadenopathy noted in the abdomen or pelvis. Reproductive: Prostate gland and seminal vesicles are unremarkable in appearance. Other: No significant volume of ascites.  No pneumoperitoneum. Musculoskeletal: There are no aggressive appearing lytic or blastic lesions noted in the visualized portions of the skeleton. IMPRESSION: 1. Nonobstructive calculi are noted within the collecting systems of both kidneys measuring up to 4 mm in the upper pole collecting system of left kidney. No ureteral stones or findings of urinary tract obstruction are noted at this time. 2. Small simple cysts in the right kidney. Additional subcentimeter low-attenuation lesion in the right kidney is too small to definitively characterize, but is statistically likely a tiny cyst. 3. Severe hepatic steatosis. 4. Aortic atherosclerosis, in addition to at least 2 vessel coronary artery disease. Please note that although the presence of coronary artery calcium documents the presence of coronary artery disease, the severity of this disease and any potential stenosis cannot be assessed on this non-gated CT examination. Assessment for potential risk factor modification, dietary therapy or pharmacologic therapy may be warranted, if clinically indicated. 5. Additional incidental findings, as above. Electronically  Signed   By: Vinnie Langton M.D.   On: 11/07/2016 13:21    Procedures Procedures (including critical care time)  Medications Ordered in ED Medications  sodium chloride 0.9 % bolus 1,000 mL (0 mLs Intravenous Stopped 11/07/16 0945)  iopamidol (ISOVUE-300) 61 % injection 100 mL (100 mLs Intravenous Contrast Given 11/07/16 1233)     Initial Impression / Assessment and Plan / ED Course  I have reviewed the triage vital signs and the nursing notes.  Pertinent labs & imaging results that were available during my care of the patient were reviewed by me and considered in my medical decision making (see chart for details).     thurman sarver is a 66 y.o. male who presenting with gross hematuria with no pain  or dysuria. Consider bladder tumor vs bladder stone. Afebrile, no CVAT. Will get labs, UA, CT ab/pel to further assess.    2:03 PM UA + hematuria with no UTI. CT showed multiple intrarenal stones with no hydro. Also simple cysts in the kidney. Hg stable. Hematuria improved with IVF and patient is not in retention. Will give flomax and have him follow up with urology. If he has more flank pain and trouble urinating, return to ED for evaluation. Otherwise, expect some hematuria and can be followed up outpatient.    Final Clinical Impressions(s) / ED Diagnoses   Final diagnoses:  None    New Prescriptions New Prescriptions   No medications on file     Drenda Freeze, MD 11/07/16 1406

## 2016-11-08 LAB — URINE CULTURE: CULTURE: NO GROWTH

## 2016-11-09 ENCOUNTER — Other Ambulatory Visit: Payer: Self-pay | Admitting: Internal Medicine

## 2016-11-10 ENCOUNTER — Encounter: Payer: Self-pay | Admitting: Internal Medicine

## 2016-11-10 ENCOUNTER — Ambulatory Visit (INDEPENDENT_AMBULATORY_CARE_PROVIDER_SITE_OTHER): Payer: BC Managed Care – PPO | Admitting: Internal Medicine

## 2016-11-10 VITALS — BP 138/84 | HR 99 | Temp 97.1°F | Wt 231.0 lb

## 2016-11-10 DIAGNOSIS — I700 Atherosclerosis of aorta: Secondary | ICD-10-CM | POA: Diagnosis not present

## 2016-11-10 DIAGNOSIS — Z794 Long term (current) use of insulin: Secondary | ICD-10-CM

## 2016-11-10 DIAGNOSIS — E0859 Diabetes mellitus due to underlying condition with other circulatory complications: Secondary | ICD-10-CM

## 2016-11-10 MED ORDER — INSULIN DEGLUDEC 100 UNIT/ML ~~LOC~~ SOPN: 50.0000 [IU] | PEN_INJECTOR | Freq: Every day | SUBCUTANEOUS | Status: DC

## 2016-11-10 MED ORDER — LISINOPRIL 10 MG PO TABS: 10.0000 mg | ORAL_TABLET | Freq: Every day | ORAL | 3 refills | Status: DC

## 2016-11-10 NOTE — Progress Notes (Signed)
Subjective:    Patient ID: Aaron Zimmerman, male    DOB: 1950/06/10, 66 y.o.   MRN: [id number]  HPI Here for follow up of ER visit Reviewed records Was starting sex--but hadn't penetrated yet Started bleeding---no true hematuria Needed to put pressure on it--then to ER CT scan showed small stones but no obstruction Never took the tamsulosin  Did go to Dr Karsten Ro and had cystoscopy On second pass, he found small tear Aspirin on hold till he returns next month  Still urinates a lot due to the [place] from urologist --samples May have helped ?mybetriq  Here mostly due to concerns about other findings Has aortic atherosclerosis but no symptoms Coronary artery calcium also noted in 2 vessels--they are both concerned about it  Sugars are better More consistent in 130's fasting Due for A1c again  Current Outpatient Prescriptions on File Prior to Visit  Medication Sig Dispense Refill  . aspirin 81 MG tablet Take 81 mg by mouth daily.    . cetirizine (ZYRTEC) 10 MG tablet Take 1 tablet (10 mg total) by mouth daily as needed for allergies. 90 tablet 3  . Cyanocobalamin (B-12 PO) Take 1 tablet by mouth daily.     Marland Kitchen FARXIGA 10 MG TABS tablet TAKE 1 TABLET BY MOUTH DAILY 90 tablet 3  . guaiFENesin-codeine (ROBITUSSIN AC) 100-10 MG/5ML syrup Take 5-10 mLs by mouth at bedtime as needed for cough. 180 mL 0  . insulin degludec (TRESIBA FLEXTOUCH) 100 UNIT/ML SOPN FlexTouch Pen Inject 0.6 mLs (60 Units total) into the skin daily. (Patient taking differently: Inject 50 Units into the skin daily. )    . lisinopril (PRINIVIL,ZESTRIL) 10 MG tablet Take 1 tablet (10 mg total) by mouth daily. 90 tablet 3  . metFORMIN (GLUCOPHAGE) 1000 MG tablet TAKE ONE TABLET BY MOUTH TWICE DAILY WITH MEALS. 60 tablet 11  . omeprazole (PRILOSEC) 40 MG capsule Take 1 capsule (40 mg total) by mouth 2 (two) times daily. 60 capsule 0  . sildenafil (REVATIO) 20 MG tablet Take 3 tablets (60 mg total)  by mouth daily as needed. Prn 50 tablet 11  . simvastatin (ZOCOR) 20 MG tablet TAKE ONE TABLET BY MOUTH EVERY NIGHT AT BEDTIME 90 tablet 3   Current Facility-Administered Medications on File Prior to Visit  Medication Dose Route Frequency Provider Last Rate Last Dose  . 0.9 %  sodium chloride infusion  500 mL Intravenous Continuous Nandigam, Venia Minks, MD        Allergies  Allergen Reactions  . Pioglitazone Rash    Past Medical History:  Diagnosis Date  . Allergy   . Diabetes mellitus ~2006  . Hyperlipidemia   . Personal history of urinary calculi   . Vertigo   . Vitamin B12 deficiency     Past Surgical History:  Procedure Laterality Date  . traumatic injury and repair of RT hand  1974    Family History  Problem Relation Age of Onset  . Heart disease Mother        CAD/PCI with stents; cardiomyopathy with AICD  . Cancer Other        Colon and Prostate  . Colon cancer Neg Hx     Social History   Social History  . Marital status: Married    Spouse name: N/A  . Number of children: 2  . Years of education: N/A   Occupational History  . 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  Smoking status: Never Smoker  . Smokeless tobacco: Former Systems developer    Types: Chew    Quit date: 02/08/1995  . Alcohol use No  . Drug use: No  . Sexual activity: Yes   Other Topics Concern  . Not on file   Social History Narrative   HSG   Married 1979   1 son 32 1 daughter 9; 1 granddaughter Eliezer Lofts '07), 1 grandson (Seth '09)   Work: [employer]   Regular exercise: walking when he can   Caffeine use: coffee all day   Review of Systems Appetite is okay Feels a bit weak    Objective:   Physical Exam        Assessment & Plan:

## 2016-11-10 NOTE — Assessment & Plan Note (Addendum)
He is already on statin and aspirin BP is okay  Also coronary calcifications They wonder about a stress test--- discussed the reasons not too. Counseled 15/20 minute visit Discussed lifestyle measures

## 2016-11-10 NOTE — Assessment & Plan Note (Signed)
Hopefully A1c is down

## 2016-11-11 LAB — HEMOGLOBIN A1C: Hgb A1c MFr Bld: 7.6 % — ABNORMAL HIGH (ref 4.6–6.5)

## 2016-11-18 ENCOUNTER — Other Ambulatory Visit: Payer: Self-pay | Admitting: Gastroenterology

## 2016-12-01 ENCOUNTER — Encounter: Payer: Self-pay | Admitting: Gastroenterology

## 2016-12-02 ENCOUNTER — Other Ambulatory Visit: Payer: Self-pay

## 2016-12-02 DIAGNOSIS — A048 Other specified bacterial intestinal infections: Secondary | ICD-10-CM

## 2016-12-05 ENCOUNTER — Other Ambulatory Visit: Payer: BC Managed Care – PPO

## 2016-12-06 ENCOUNTER — Other Ambulatory Visit: Payer: BC Managed Care – PPO

## 2016-12-06 DIAGNOSIS — A048 Other specified bacterial intestinal infections: Secondary | ICD-10-CM

## 2016-12-09 LAB — H. PYLORI ANTIGEN, STOOL: H pylori Ag, Stl: NEGATIVE

## 2016-12-21 ENCOUNTER — Telehealth: Payer: Self-pay | Admitting: Internal Medicine

## 2016-12-21 DIAGNOSIS — I2510 Atherosclerotic heart disease of native coronary artery without angina pectoris: Secondary | ICD-10-CM

## 2016-12-21 DIAGNOSIS — I2584 Coronary atherosclerosis due to calcified coronary lesion: Principal | ICD-10-CM

## 2016-12-21 NOTE — Telephone Encounter (Signed)
Referral placed Not sure if functional test should be undertaken

## 2016-12-23 ENCOUNTER — Ambulatory Visit: Payer: BC Managed Care – PPO | Admitting: Cardiovascular Disease

## 2016-12-23 ENCOUNTER — Encounter: Payer: Self-pay | Admitting: Gastroenterology

## 2016-12-23 ENCOUNTER — Encounter: Payer: Self-pay | Admitting: Cardiovascular Disease

## 2016-12-23 VITALS — BP 100/70 | HR 70 | Ht 67.5 in | Wt 231.2 lb

## 2016-12-23 DIAGNOSIS — I2584 Coronary atherosclerosis due to calcified coronary lesion: Secondary | ICD-10-CM

## 2016-12-23 DIAGNOSIS — I10 Essential (primary) hypertension: Secondary | ICD-10-CM | POA: Insufficient documentation

## 2016-12-23 DIAGNOSIS — R0789 Other chest pain: Secondary | ICD-10-CM

## 2016-12-23 DIAGNOSIS — I2510 Atherosclerotic heart disease of native coronary artery without angina pectoris: Secondary | ICD-10-CM | POA: Diagnosis not present

## 2016-12-23 NOTE — Assessment & Plan Note (Signed)
History of essential hypertension on lisinopril for blood pressure 100/70. 2 current meds (

## 2016-12-23 NOTE — Assessment & Plan Note (Signed)
History of hyperlipidemia on statin therapy followed by his PCP 

## 2016-12-23 NOTE — Assessment & Plan Note (Signed)
History of coronary calcification on chest CT. Does have risk factors and occasional atypical chest pain. The consultation is in the dissolution of the LAD and RCA. To get an exercise Myoview to further evaluate the physiologic significance.

## 2016-12-23 NOTE — Progress Notes (Signed)
12/23/2016 [place]   Jan 18, 1951  [id number]  Primary Physician Venia Carbon, MD Primary Cardiologist: Lorretta Harp MD Lupe Carney, [place]  HPI:  Aaron Zimmerman is a 66 y.o. male married, father of 2, grandfather and 4 grandchildren referred by Dr. Annamarie Major for cardiovascular evaluation because of coronary calcification demonstrated on CT imaging. 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 He does have a history of treated diabetes, hypertension and hyperlipidemia. His father had CAD. He's never had a heart attack or stroke. He has occasional atypical chest pain. He had a CT performed of his abdomen and pelvis the setting of bleeding from his penis on 11/07/16 that showed calcification in the LAD and RCA.   Current Meds  Medication Sig  . aspirin 81 MG tablet Take 81 mg by mouth daily.  . cetirizine (ZYRTEC) 10 MG tablet Take 1 tablet (10 mg total) by mouth daily as needed for allergies.  . Cyanocobalamin (B-12 PO) Take 1 tablet by mouth daily.   Marland Kitchen FARXIGA 10 MG TABS tablet TAKE 1 TABLET BY MOUTH DAILY  . guaiFENesin-codeine (ROBITUSSIN AC) 100-10 MG/5ML syrup Take 5-10 mLs by mouth at bedtime as needed for cough.  . insulin degludec (TRESIBA FLEXTOUCH) 100 UNIT/ML SOPN FlexTouch Pen Inject 0.5 mLs (50 Units total) into the skin daily.  Marland Kitchen lisinopril (PRINIVIL,ZESTRIL) 10 MG tablet Take 1 tablet (10 mg total) by mouth daily.  . metFORMIN (GLUCOPHAGE) 1000 MG tablet TAKE ONE TABLET BY MOUTH TWICE DAILY WITH MEALS.  Marland Kitchen omeprazole (PRILOSEC) 40 MG capsule TAKE ONE (1) CAPSULE BY MOUTH 2 TIMES DAILY  . sildenafil (REVATIO) 20 MG tablet Take 3 tablets (60 mg total) by mouth daily as needed. Prn  . simvastatin (ZOCOR) 20 MG tablet TAKE ONE TABLET BY MOUTH EVERY NIGHT AT BEDTIME   Current Facility-Administered Medications for the 12/23/16 encounter (Office Visit) with Lorretta Harp, MD  Medication  . 0.9 %  sodium chloride infusion     Allergies    Allergen Reactions  . Pioglitazone Rash    Social History   Socioeconomic History  . Marital status: Married    Spouse name: Not on file  . Number of children: 2  . Years of education: Not on file  . Highest education level: Not on file  Social Needs  . Financial resource strain: Not on file  . Food insecurity - worry: Not on file  . Food insecurity - inability: Not on file  . Transportation needs - medical: Not on file  . Transportation needs - non-medical: Not on file  Occupational History  . Occupation: Maintenance-- temperature controls    Employer: [employer]  Tobacco Use  . Smoking status: Never Smoker  . Smokeless tobacco: Former Systems developer    Types: Chew  Substance and Sexual Activity  . Alcohol use: No  . Drug use: No  . Sexual activity: Yes  Other Topics Concern  . Not on file  Social History Narrative   HSG   Married 1979   1 son 28 1 daughter 46; 1 granddaughter Eliezer Lofts '07), 1 grandson (Seth '09)   Work: [employer]   Regular exercise: walking when he can   Caffeine use: coffee all day     Review of Systems: General: negative for chills, fever, night sweats or weight changes.  Cardiovascular: negative for chest pain, dyspnea on exertion, edema, orthopnea, palpitations, paroxysmal nocturnal dyspnea or shortness of breath Dermatological: negative  for rash Respiratory: negative for cough or wheezing Urologic: negative for hematuria Abdominal: negative for nausea, vomiting, diarrhea, bright red blood per rectum, melena, or hematemesis Neurologic: negative for visual changes, syncope, or dizziness All other systems reviewed and are otherwise negative except as noted above.    Blood pressure 100/70, pulse 70, height 5' 7.5" (1.715 m), weight 231 lb 3.2 oz (104.9 kg), SpO2 95 %.  General appearance: alert and no distress Neck: no adenopathy, no carotid bruit, no JVD, supple, symmetrical, trachea midline and thyroid not enlarged,  symmetric, no tenderness/mass/nodules Lungs: clear to auscultation bilaterally Heart: regular rate and rhythm, S1, S2 normal, no murmur, click, rub or gallop Extremities: extremities normal, atraumatic, no cyanosis or edema Pulses: 2+ and symmetric Skin: Skin color, texture, turgor normal. No rashes or lesions Neurologic: Alert and oriented X 3, normal strength and tone. Normal symmetric reflexes. Normal coordination and gait  EKG sinus rhythm at 70 with right bundle branch block. I personally reviewed this EKG.  ASSESSMENT AND PLAN:   Hyperlipemia History of hyperlipidemia on statin therapy followed by his PCP  Essential hypertension History of essential hypertension on lisinopril for blood pressure 100/70. 2 current meds (  Coronary artery calcification seen on CT scan History of coronary calcification on chest CT. Does have risk factors and occasional atypical chest pain. The consultation is in the dissolution of the LAD and RCA. To get an exercise Myoview to further evaluate the physiologic significance.      Lorretta Harp MD FACP,FACC,FAHA, [facility] 12/23/2016 1:46 PM

## 2016-12-23 NOTE — Patient Instructions (Signed)
Medication Instructions: Your physician recommends that you continue on your current medications as directed. Please refer to the Current Medication list given to you today.   Testing/Procedures: Your physician has requested that you have an exercise stress myoview. For further information please visit [email or web address]. Please follow instruction sheet, as given.  Follow-Up: Your physician recommends that you schedule a follow-up appointment as needed with Dr. Berry.   

## 2016-12-28 ENCOUNTER — Telehealth (HOSPITAL_COMMUNITY): Payer: Self-pay

## 2016-12-28 NOTE — Telephone Encounter (Signed)
Encounter complete. 

## 2017-01-03 ENCOUNTER — Ambulatory Visit (HOSPITAL_COMMUNITY)
Admission: RE | Admit: 2017-01-03 | Discharge: 2017-01-03 | Disposition: A | Discharge status: Home / Self Care | Payer: BC Managed Care – PPO | Source: Ambulatory Visit | Attending: Cardiology | Admitting: Cardiology

## 2017-01-03 DIAGNOSIS — E119 Type 2 diabetes mellitus without complications: Secondary | ICD-10-CM | POA: Insufficient documentation

## 2017-01-03 DIAGNOSIS — I4510 Unspecified right bundle-branch block: Secondary | ICD-10-CM | POA: Insufficient documentation

## 2017-01-03 DIAGNOSIS — I2510 Atherosclerotic heart disease of native coronary artery without angina pectoris: Secondary | ICD-10-CM | POA: Insufficient documentation

## 2017-01-03 DIAGNOSIS — Z6835 Body mass index (BMI) 35.0-35.9, adult: Secondary | ICD-10-CM | POA: Insufficient documentation

## 2017-01-03 DIAGNOSIS — I10 Essential (primary) hypertension: Secondary | ICD-10-CM | POA: Insufficient documentation

## 2017-01-03 DIAGNOSIS — Z87891 Personal history of nicotine dependence: Secondary | ICD-10-CM | POA: Diagnosis not present

## 2017-01-03 DIAGNOSIS — R0789 Other chest pain: Secondary | ICD-10-CM

## 2017-01-03 DIAGNOSIS — I2584 Coronary atherosclerosis due to calcified coronary lesion: Secondary | ICD-10-CM | POA: Diagnosis not present

## 2017-01-03 DIAGNOSIS — E669 Obesity, unspecified: Secondary | ICD-10-CM | POA: Diagnosis not present

## 2017-01-03 DIAGNOSIS — Z8249 Family history of ischemic heart disease and other diseases of the circulatory system: Secondary | ICD-10-CM | POA: Insufficient documentation

## 2017-01-03 LAB — MYOCARDIAL PERFUSION IMAGING
CHL RATE OF PERCEIVED EXERTION: 18
CSEPEDS: 1 s
Estimated workload: 8.5 METS
Exercise duration (min): 7 min
LVDIAVOL: 85 mL (ref 62–150)
LVSYSVOL: 31 mL
MPHR: 156 {beats}/min
Peak HR: 142 {beats}/min
Percent HR: 91 %
Rest HR: 74 {beats}/min
SDS: 1
SRS: 1
SSS: 2
TID: 1.04

## 2017-01-03 MED ORDER — TECHNETIUM TC 99M TETROFOSMIN IV KIT
28.2000 | PACK | Freq: Once | INTRAVENOUS | Status: AC | PRN
  Administered 2017-01-03: 28.2 via INTRAVENOUS
  Filled 2017-01-03: qty 29

## 2017-01-03 MED ORDER — TECHNETIUM TC 99M TETROFOSMIN IV KIT
9.9000 | PACK | Freq: Once | INTRAVENOUS | Status: AC | PRN
  Administered 2017-01-03: 9.9 via INTRAVENOUS
  Filled 2017-01-03: qty 10

## 2017-02-03 ENCOUNTER — Telehealth: Payer: Self-pay | Admitting: Internal Medicine

## 2017-02-03 NOTE — Telephone Encounter (Signed)
Pt has a dry and hacky cough for 1 1/2 weeks. Pt taking OTC med for cough with no effectivenss. No SOB, pt has slight wheezing; when blows nose has goopy yellow mucus. Low grade fever of 99 on and off.pt last seen 11/10/16. Pt scheduled appt on 02/04/17 to see Dr Glori Bickers at [facility] but wants to see if Dr Silvio Pate could suggest something to take and not have to go for appt.pt request cb.

## 2017-02-03 NOTE — Telephone Encounter (Signed)
Copied from [place]. Topic: Quick Communication - See Telephone Encounter >> Feb 03, 2017  1:42 PM Bea Graff, NT wrote: CRM for notification. See Telephone encounter for: Pt would like to see if something for his cough can be called into [facility]. He is feeling some better from a sinus infection but he cannot get rid of the cough. Please call to let know if this can be or is done for pt.   02/03/17.

## 2017-02-04 ENCOUNTER — Ambulatory Visit: Payer: BC Managed Care – PPO | Admitting: Family Medicine

## 2017-02-04 ENCOUNTER — Encounter: Payer: Self-pay | Admitting: Family Medicine

## 2017-02-04 VITALS — BP 120/78 | HR 89 | Temp 98.3°F | Wt 222.0 lb

## 2017-02-04 DIAGNOSIS — J0110 Acute frontal sinusitis, unspecified: Secondary | ICD-10-CM | POA: Diagnosis not present

## 2017-02-04 DIAGNOSIS — J019 Acute sinusitis, unspecified: Secondary | ICD-10-CM | POA: Insufficient documentation

## 2017-02-04 MED ORDER — BENZONATATE 200 MG PO CAPS: 200.0000 mg | ORAL_CAPSULE | Freq: Three times a day (TID) | ORAL | 1 refills | Status: DC | PRN

## 2017-02-04 MED ORDER — AMOXICILLIN-POT CLAVULANATE 875-125 MG PO TABS: 1.0000 | ORAL_TABLET | Freq: Two times a day (BID) | ORAL | 0 refills | Status: DC

## 2017-02-04 NOTE — Telephone Encounter (Signed)
This came after I had left. He should be seen obviously

## 2017-02-04 NOTE — Progress Notes (Signed)
Subjective:    Patient ID: Aaron Zimmerman, male    DOB: 1950/06/06, 66 y.o.   MRN: [id number]  HPI Here for cough and congestion   Started about 2 weeks ago as a cold  Not getting better  Got better and then worse again  Today wiped out   Some wheezing and tight chest the past few days  Low grade temp at home   Nose is congested- thick colored mucous  Has headaches - above his eyes  Ears are fine -do not hurt  Throat was raw-that is improved   Cough - dry and non productive   Taking mucinex and delsym  Elderberry supplement  Tessalon pearles from wife    Patient Active Problem List   Diagnosis Date Noted  . Acute sinusitis 02/04/2017  . Essential hypertension 12/23/2016  . Coronary artery calcification seen on CT scan 12/23/2016  . Aortic atherosclerosis ([place]) 11/10/2016  . Fever, unspecified 08/12/2016  . Dysphagia 08/12/2016  . Dental infection 11/06/2015  . Vitamin B12 deficiency   . Viral URI with cough 04/13/2015  . Erectile dysfunction associated with type 2 diabetes mellitus ([place]) 07/14/2014  . Routine health maintenance 03/09/2013  . Polyp of colon, adenomatous 03/06/2013  . Solitary pulmonary nodule 07/22/2012  . B12 deficiency 03/01/2010  . ALLERGIC RHINITIS, SEASONAL, MILD 04/28/2009  . ONYCHOMYCOSIS, TOENAILS 03/02/2009  . Diabetes mellitus with circulatory complication ([place]) 36/14/4315  . Hyperlipemia 01/22/2007   Past Medical History:  Diagnosis Date  . Allergy   . Diabetes mellitus ~2006  . Hyperlipidemia   . Personal history of urinary calculi   . Vertigo   . Vitamin B12 deficiency    Past Surgical History:  Procedure Laterality Date  . traumatic injury and repair of RT hand  1974   Social History   Tobacco Use  . Smoking status: Never Smoker  . Smokeless tobacco: Former Systems developer    Types: Chew  Substance Use Topics  . Alcohol use: No  . Drug use: No   Family History  Problem Relation Age of Onset  . Heart disease Mother      CAD/PCI with stents; cardiomyopathy with AICD  . Cancer Other        Colon and Prostate  . Colon cancer Neg Hx    Allergies  Allergen Reactions  . Pioglitazone Rash   Current Outpatient Medications on File Prior to Visit  Medication Sig Dispense Refill  . aspirin 81 MG tablet Take 81 mg by mouth daily.    . cetirizine (ZYRTEC) 10 MG tablet Take 1 tablet (10 mg total) by mouth daily as needed for allergies. 90 tablet 3  . Cyanocobalamin (B-12 PO) Take 1 tablet by mouth daily.     Marland Kitchen FARXIGA 10 MG TABS tablet TAKE 1 TABLET BY MOUTH DAILY 90 tablet 3  . insulin degludec (TRESIBA FLEXTOUCH) 100 UNIT/ML SOPN FlexTouch Pen Inject 0.5 mLs (50 Units total) into the skin daily.    Marland Kitchen lisinopril (PRINIVIL,ZESTRIL) 10 MG tablet Take 1 tablet (10 mg total) by mouth daily. 90 tablet 3  . metFORMIN (GLUCOPHAGE) 1000 MG tablet TAKE ONE TABLET BY MOUTH TWICE DAILY WITH MEALS. 60 tablet 11  . sildenafil (REVATIO) 20 MG tablet Take 3 tablets (60 mg total) by mouth daily as needed. Prn 50 tablet 11  . simvastatin (ZOCOR) 20 MG tablet TAKE ONE TABLET BY MOUTH EVERY NIGHT AT BEDTIME 90 tablet 3   Current Facility-Administered Medications on File Prior to Visit  Medication Dose  Route Frequency Provider Last Rate Last Dose  . 0.9 %  sodium chloride infusion  500 mL Intravenous Continuous Nandigam, Venia Minks, MD         Review of Systems  Constitutional: Positive for appetite change and fatigue. Negative for fever.  HENT: Positive for congestion, postnasal drip, rhinorrhea, sinus pressure, sinus pain, sneezing and sore throat. Negative for ear discharge, ear pain and facial swelling.   Eyes: Negative for pain and discharge.  Respiratory: Positive for cough and chest tightness. Negative for shortness of breath, wheezing and stridor.   Cardiovascular: Negative for chest pain.  Gastrointestinal: Negative for diarrhea, nausea and vomiting.  Genitourinary: Negative for frequency, hematuria and urgency.    Musculoskeletal: Negative for arthralgias and myalgias.  Skin: Negative for rash.  Neurological: Positive for headaches. Negative for dizziness, weakness and light-headedness.  Psychiatric/Behavioral: Negative for confusion and dysphoric mood.       Objective:   Physical Exam  Constitutional: He appears well-developed and well-nourished. No distress.  overwt and well app  HENT:  Head: Normocephalic and atraumatic.  Right Ear: External ear normal.  Left Ear: External ear normal.  Mouth/Throat: Oropharynx is clear and moist. No oropharyngeal exudate.  Eyes: Conjunctivae and EOM are normal. Pupils are equal, round, and reactive to light. Right eye exhibits no discharge. Left eye exhibits no discharge.  Neck: Normal range of motion. Neck supple.  Cardiovascular: Normal rate and regular rhythm.  Pulmonary/Chest: Effort normal and breath sounds normal. No respiratory distress. He has no wheezes. He has no rales. He exhibits no tenderness.  Good air exch No wheeze even on forced exp   Musculoskeletal: Normal range of motion.  Lymphadenopathy:    He has no cervical adenopathy.  Neurological: He is alert. No cranial nerve deficit.  Skin: Skin is warm and dry. No rash noted. No erythema.  Psychiatric: He has a normal mood and affect.          Assessment & Plan:   Problem List Items Addressed This Visit      Respiratory   Acute sinusitis - Primary    S/p uri 2 weeks  Cover with augmentin Tessalon for cough  Fluids /rest  guif -DM otc for cough Disc symptomatic care - see instructions on AVS  Update if not starting to improve in a week or if worsening        Relevant Medications   amoxicillin-clavulanate (AUGMENTIN) 875-125 MG tablet   benzonatate (TESSALON) 200 MG capsule

## 2017-02-04 NOTE — Patient Instructions (Signed)
Drink lots of fluids and rest  Breathe steam for sinus congestion  Also use nasal saline spray  Take the augmentin for sinus infection   For cough- tessalon as directed mucinex DM as directed    Update if not starting to improve in a week or if worsening

## 2017-02-05 NOTE — Assessment & Plan Note (Signed)
S/p uri 2 weeks  Cover with augmentin Tessalon for cough  Fluids /rest  guif -DM otc for cough Disc symptomatic care - see instructions on AVS  Update if not starting to improve in a week or if worsening

## 2017-02-06 NOTE — Telephone Encounter (Signed)
Per chart review tab pt was seen 02/04/17.FYI Dr Glori Bickers.

## 2017-03-28 ENCOUNTER — Other Ambulatory Visit: Payer: Self-pay | Admitting: Internal Medicine

## 2017-04-03 ENCOUNTER — Other Ambulatory Visit: Payer: Self-pay | Admitting: Internal Medicine

## 2017-04-03 NOTE — Telephone Encounter (Signed)
Copied from [place] [phone]. Topic: Quick Communication - See Telephone Encounter >> Apr 03, 2017  2:16 PM Burnis Medin, NT wrote: CRM for notification. See Telephone encounter for: Patient wife called and said the doctor gave pt a paper prescription for sildenafil (REVATIO) 20 MG tablet and they some how lost the script. Wife wanted to know if the doctor could sent him another prescription to [facility].  04/03/17.

## 2017-04-03 NOTE — Telephone Encounter (Signed)
Pt's wife calling stating that paper prescription for Sildenafil (Revatio) 20mg  tablet was lost and wanted to know if another prescription could be sent in to the pharmacy.  Original prescription written on 08/11/16 #50 with 11 refills  LOV: 11/10/16  PCP: Dr. Silvio Pate  Pharmacy [facility]

## 2017-04-04 MED ORDER — SILDENAFIL CITRATE 20 MG PO TABS: 60.0000 mg | ORAL_TABLET | Freq: Every day | ORAL | 11 refills | Status: DC | PRN

## 2017-04-04 NOTE — Telephone Encounter (Signed)
Rx sent electronically.  

## 2017-04-04 NOTE — Addendum Note (Signed)
Addended by: Pilar Grammes on: 04/04/2017 09:53 AM   Modules accepted: Orders

## 2017-05-10 ENCOUNTER — Ambulatory Visit: Payer: BC Managed Care – PPO | Admitting: Internal Medicine

## 2017-05-10 ENCOUNTER — Encounter: Payer: Self-pay | Admitting: Internal Medicine

## 2017-05-10 VITALS — BP 102/64 | HR 68 | Temp 97.5°F | Ht 68.0 in | Wt 226.0 lb

## 2017-05-10 DIAGNOSIS — I2510 Atherosclerotic heart disease of native coronary artery without angina pectoris: Secondary | ICD-10-CM

## 2017-05-10 DIAGNOSIS — E1159 Type 2 diabetes mellitus with other circulatory complications: Secondary | ICD-10-CM

## 2017-05-10 LAB — POCT GLYCOSYLATED HEMOGLOBIN (HGB A1C): Hemoglobin A1C: 8.6

## 2017-05-10 NOTE — Assessment & Plan Note (Signed)
Control worsened since last time--up to 8.6% Some fairly low sugars in AM --so don't want to increase insulin Discussed cutting out late night eating, etc No med changes unless still up next time

## 2017-05-10 NOTE — Progress Notes (Signed)
Subjective:    Patient ID: Aaron Zimmerman, male    DOB: Oct 04, 1950, 67 y.o.   MRN: [id number]  HPI Here for follow up of diabetes  He is checking it daily in AM--- usually 99-150 No hypoglycemic reactions  No recurrent bleeding from penis Probably just some small tear Some foamy urine--- discussed may be from increased sugar with the farxiga No foot numbness or pain  Is looking to retire in about a year Part time job at [employer] now---as a start--so eating later at night  Current Outpatient Medications on File Prior to Visit  Medication Sig Dispense Refill  . aspirin 81 MG tablet Take 81 mg by mouth daily.    . cetirizine (ZYRTEC) 10 MG tablet Take 1 tablet (10 mg total) by mouth daily as needed for allergies. 90 tablet 3  . Cyanocobalamin (B-12 PO) Take 1 tablet by mouth daily.     Marland Kitchen FARXIGA 10 MG TABS tablet TAKE 1 TABLET BY MOUTH ONCE DAILY 90 tablet 3  . insulin degludec (TRESIBA FLEXTOUCH) 100 UNIT/ML SOPN FlexTouch Pen Inject 0.5 mLs (50 Units total) into the skin daily.    Marland Kitchen lisinopril (PRINIVIL,ZESTRIL) 10 MG tablet Take 1 tablet (10 mg total) by mouth daily. 90 tablet 3  . metFORMIN (GLUCOPHAGE) 1000 MG tablet TAKE ONE TABLET BY MOUTH TWICE DAILY WITH MEALS. 60 tablet 11  . sildenafil (REVATIO) 20 MG tablet Take 3 tablets (60 mg total) by mouth daily as needed. Prn 50 tablet 11  . simvastatin (ZOCOR) 20 MG tablet TAKE ONE TABLET BY MOUTH EVERY NIGHT AT BEDTIME 90 tablet 3   No current facility-administered medications on file prior to visit.     Allergies  Allergen Reactions  . Pioglitazone Rash    Past Medical History:  Diagnosis Date  . Allergy   . Diabetes mellitus ~2006  . Hyperlipidemia   . Personal history of urinary calculi   . Vertigo   . Vitamin B12 deficiency     Past Surgical History:  Procedure Laterality Date  . traumatic injury and repair of RT hand  1974    Family History  Problem Relation Age of Onset  . Heart disease Mother    CAD/PCI with stents; cardiomyopathy with AICD  . Cancer Other        Colon and Prostate  . Colon cancer Neg Hx     Social History   Socioeconomic History  . Marital status: Married    Spouse name: Not on file  . Number of children: 2  . Years of education: Not on file  . Highest education level: Not on file  Occupational History  . Occupation: Maintenance-- temperature controls    Employer: [employer]  Social Needs  . Financial resource strain: Not on file  . Food insecurity:    Worry: Not on file    Inability: Not on file  . Transportation needs:    Medical: Not on file    Non-medical: Not on file  Tobacco Use  . Smoking status: Never Smoker  . Smokeless tobacco: Former Systems developer    Types: Chew  Substance and Sexual Activity  . Alcohol use: No  . Drug use: No  . Sexual activity: Yes  Lifestyle  . Physical activity:    Days per week: Not on file    Minutes per session: Not on file  . Stress: Not on file  Relationships  . Social connections:    Talks on phone: Not on file  Gets together: Not on file    Attends religious service: Not on file    Active member of club or organization: Not on file    Attends meetings of clubs or organizations: Not on file    Relationship status: Not on file  . Intimate partner violence:    Fear of current or ex partner: Not on file    Emotionally abused: Not on file    Physically abused: Not on file    Forced sexual activity: Not on file  Other Topics Concern  . Not on file  Social History Narrative   HSG   Married 1979   1 son 74 1 daughter 33; 1 granddaughter Eliezer Lofts '07), 1 grandson (Seth '09)   Work: [employer]   Regular exercise: walking when he can   Caffeine use: coffee all day   Review of Systems Appetite is about the same Weight fairly stable Sleeping okay Precancer cut off back at derm--- lesion on left elbow treated also Had cardiology evaluation--no action needed    Objective:    Physical Exam  Constitutional: He appears well-developed. No distress.  Neck: No thyromegaly present.  Cardiovascular: Normal rate, regular rhythm, normal heart sounds and intact distal pulses. Exam reveals no gallop.  No murmur heard. Pulmonary/Chest: Effort normal and breath sounds normal. No respiratory distress. He has no wheezes. He has no rales.  Abdominal: Soft.  Musculoskeletal: He exhibits no edema.  Lymphadenopathy:    He has no cervical adenopathy.  Neurological:   Normal sensation in feet  Skin:  No foot lesions  Psychiatric: He has a normal mood and affect. His behavior is normal.          Assessment & Plan:

## 2017-05-10 NOTE — Assessment & Plan Note (Signed)
Is on statin and aspirin already

## 2017-05-12 ENCOUNTER — Ambulatory Visit: Payer: BC Managed Care – PPO | Admitting: Internal Medicine

## 2017-06-06 LAB — HM DIABETES EYE EXAM

## 2017-06-08 ENCOUNTER — Encounter: Payer: Self-pay | Admitting: Internal Medicine

## 2017-07-10 ENCOUNTER — Other Ambulatory Visit: Payer: Self-pay | Admitting: Internal Medicine

## 2017-10-05 ENCOUNTER — Encounter: Payer: Self-pay | Admitting: Internal Medicine

## 2017-10-05 ENCOUNTER — Ambulatory Visit: Payer: BC Managed Care – PPO | Admitting: Internal Medicine

## 2017-10-05 VITALS — BP 126/80 | HR 106 | Temp 98.3°F | Ht 68.0 in | Wt 227.0 lb

## 2017-10-05 DIAGNOSIS — K047 Periapical abscess without sinus: Secondary | ICD-10-CM

## 2017-10-05 MED ORDER — AMOXICILLIN-POT CLAVULANATE 875-125 MG PO TABS: 1.0000 | ORAL_TABLET | Freq: Two times a day (BID) | ORAL | 1 refills | Status: DC

## 2017-10-05 NOTE — Assessment & Plan Note (Signed)
Feels bad and swelling overlying some (not so healthy looking) teeth Will treat with augmentin again (similar to 2 years ago) Is going to see dentist soon

## 2017-10-05 NOTE — Progress Notes (Signed)
Subjective:    Patient ID: Aaron Zimmerman, male    DOB: 08-07-1950, 67 y.o.   MRN: [id number]  HPI Here due to feeling poorly  Started feeling poorly 2 days ago [place] and went to bed---tossed and turned Got up yesterday--went to work, but felt worse and worse Went to sleep at [facility] and woke at 1AM--couldn't go back to sleep  Noticed noted lump in left jaw No tooth pain Similar to past spell ---responded to antibiotics   Wife noted fever 2 nights ago Did sweat that night  Current Outpatient Medications on File Prior to Visit  Medication Sig Dispense Refill  . aspirin 81 MG tablet Take 81 mg by mouth daily.    . cetirizine (ZYRTEC) 10 MG tablet Take 1 tablet (10 mg total) by mouth daily as needed for allergies. 90 tablet 3  . Cyanocobalamin (B-12 PO) Take 1 tablet by mouth daily.     Marland Kitchen FARXIGA 10 MG TABS tablet TAKE 1 TABLET BY MOUTH ONCE DAILY 90 tablet 3  . lisinopril (PRINIVIL,ZESTRIL) 10 MG tablet Take 1 tablet (10 mg total) by mouth daily. 90 tablet 3  . metFORMIN (GLUCOPHAGE) 1000 MG tablet TAKE 1 TABLET BY MOUTH TWICE A DAY WITH MEALS 60 tablet 11  . sildenafil (REVATIO) 20 MG tablet Take 3 tablets (60 mg total) by mouth daily as needed. Prn 50 tablet 11  . simvastatin (ZOCOR) 20 MG tablet TAKE ONE TABLET BY MOUTH EVERY NIGHT AT BEDTIME 90 tablet 3  . TRESIBA FLEXTOUCH 100 UNIT/ML SOPN FlexTouch Pen INJECT 40 UNITS INTO THE SKIN ONCE DAILY. 15 mL 5   No current facility-administered medications on file prior to visit.     Allergies  Allergen Reactions  . Pioglitazone Rash    Past Medical History:  Diagnosis Date  . Allergy   . Diabetes mellitus ~2006  . Hyperlipidemia   . Personal history of urinary calculi   . Vertigo   . Vitamin B12 deficiency     Past Surgical History:  Procedure Laterality Date  . traumatic injury and repair of RT hand  1974    Family History  Problem Relation Age of Onset  . Heart disease Mother        CAD/PCI with  stents; cardiomyopathy with AICD  . Cancer Other        Colon and Prostate  . Colon cancer Neg Hx     Social History   Socioeconomic History  . Marital status: Married    Spouse name: Not on file  . Number of children: 2  . Years of education: Not on file  . Highest education level: Not on file  Occupational History  . Occupation: Maintenance-- temperature controls    Employer: [employer]  Social Needs  . Financial resource strain: Not on file  . Food insecurity:    Worry: Not on file    Inability: Not on file  . Transportation needs:    Medical: Not on file    Non-medical: Not on file  Tobacco Use  . Smoking status: Never Smoker  . Smokeless tobacco: Former Systems developer    Types: Chew  Substance and Sexual Activity  . Alcohol use: No  . Drug use: No  . Sexual activity: Yes  Lifestyle  . Physical activity:    Days per week: Not on file    Minutes per session: Not on file  . Stress: Not on file  Relationships  . Social connections:  Talks on phone: Not on file    Gets together: Not on file    Attends religious service: Not on file    Active member of club or organization: Not on file    Attends meetings of clubs or organizations: Not on file    Relationship status: Not on file  . Intimate partner violence:    Fear of current or ex partner: Not on file    Emotionally abused: Not on file    Physically abused: Not on file    Forced sexual activity: Not on file  Other Topics Concern  . Not on file  Social History Narrative   HSG   Married 1979   1 son 76 1 daughter 71; 1 granddaughter Eliezer Lofts '07), 1 grandson (Seth '09)   Work: [employer]   Regular exercise: walking when he can   Caffeine use: coffee all day   Review of Systems No cough No SOB No swallowing problems Appetite is fine Sugars better--mostly not over 135    Objective:   Physical Exam  Constitutional: No distress.  Looks a bit tired  HENT:  Slight redness at left  lower molars--slight bleeding after touching with tongue depressor Swelling without sig tenderness at left jaw (overlying those teeth)  Respiratory: Effort normal and breath sounds normal. No respiratory distress. He has no wheezes. He has no rales.           Assessment & Plan:

## 2017-11-22 ENCOUNTER — Other Ambulatory Visit: Payer: Self-pay | Admitting: Internal Medicine

## 2017-11-29 ENCOUNTER — Encounter: Payer: Self-pay | Admitting: Internal Medicine

## 2017-11-29 ENCOUNTER — Ambulatory Visit (INDEPENDENT_AMBULATORY_CARE_PROVIDER_SITE_OTHER): Payer: BC Managed Care – PPO | Admitting: Internal Medicine

## 2017-11-29 VITALS — BP 112/74 | HR 78 | Temp 97.7°F | Ht 67.25 in | Wt 227.0 lb

## 2017-11-29 DIAGNOSIS — E0859 Diabetes mellitus due to underlying condition with other circulatory complications: Secondary | ICD-10-CM

## 2017-11-29 DIAGNOSIS — I10 Essential (primary) hypertension: Secondary | ICD-10-CM

## 2017-11-29 DIAGNOSIS — Z794 Long term (current) use of insulin: Secondary | ICD-10-CM

## 2017-11-29 DIAGNOSIS — I700 Atherosclerosis of aorta: Secondary | ICD-10-CM

## 2017-11-29 DIAGNOSIS — Z0000 Encounter for general adult medical examination without abnormal findings: Secondary | ICD-10-CM | POA: Diagnosis not present

## 2017-11-29 DIAGNOSIS — E538 Deficiency of other specified B group vitamins: Secondary | ICD-10-CM

## 2017-11-29 LAB — CBC
HEMATOCRIT: 49.9 % (ref 39.0–52.0)
HEMOGLOBIN: 16.7 g/dL (ref 13.0–17.0)
MCHC: 33.6 g/dL (ref 30.0–36.0)
MCV: 86.1 fl (ref 78.0–100.0)
Platelets: 204 10*3/uL (ref 150.0–400.0)
RBC: 5.79 Mil/uL (ref 4.22–5.81)
RDW: 13.1 % (ref 11.5–15.5)
WBC: 7.3 10*3/uL (ref 4.0–10.5)

## 2017-11-29 LAB — COMPREHENSIVE METABOLIC PANEL
ALBUMIN: 4.8 g/dL (ref 3.5–5.2)
ALT: 20 U/L (ref 0–53)
AST: 18 U/L (ref 0–37)
Alkaline Phosphatase: 51 U/L (ref 39–117)
BUN: 15 mg/dL (ref 6–23)
CALCIUM: 9.9 mg/dL (ref 8.4–10.5)
CHLORIDE: 101 meq/L (ref 96–112)
CO2: 28 meq/L (ref 19–32)
CREATININE: 0.96 mg/dL (ref 0.40–1.50)
GFR: 83.54 mL/min (ref >60.00)
Glucose, Bld: 97 mg/dL (ref 70–99)
Potassium: 3.9 mEq/L (ref 3.5–5.1)
Sodium: 138 mEq/L (ref 135–145)
Total Bilirubin: 1 mg/dL (ref 0.2–1.2)
Total Protein: 7.6 g/dL (ref 6.0–8.3)

## 2017-11-29 LAB — LIPID PANEL
CHOL/HDL RATIO: 3
CHOLESTEROL: 112 mg/dL (ref 0–200)
HDL: 34.7 mg/dL — AB (ref >39.00)
LDL CALC: 57 mg/dL (ref 0–99)
NonHDL: 77.27
TRIGLYCERIDES: 100 mg/dL (ref 0.0–149.0)
VLDL: 20 mg/dL (ref 0.0–40.0)

## 2017-11-29 LAB — VITAMIN B12: Vitamin B-12: 622 pg/mL (ref 211–911)

## 2017-11-29 LAB — HM DIABETES FOOT EXAM

## 2017-11-29 LAB — HEMOGLOBIN A1C: Hgb A1c MFr Bld: 7.4 % — ABNORMAL HIGH (ref 4.6–6.5)

## 2017-11-29 MED ORDER — INSULIN DEGLUDEC 100 UNIT/ML ~~LOC~~ SOPN: 50.0000 [IU] | PEN_INJECTOR | Freq: Every day | SUBCUTANEOUS | 5 refills | Status: DC

## 2017-11-29 NOTE — Assessment & Plan Note (Signed)
BP Readings from Last 3 Encounters:  11/29/17 112/74  10/05/17 126/80  05/10/17 102/64   Good control

## 2017-11-29 NOTE — Assessment & Plan Note (Signed)
Will plan to increase insulin if still up Consider adding another medication--low dose glipizide

## 2017-11-29 NOTE — Progress Notes (Signed)
Subjective:    Patient ID: Aaron Zimmerman, male    DOB: [birth year], 67 y.o.   MRN: [id number]  HPI Here for physical [place] off back of truck at work 2 days ago--hit tailbone and scraped right arm  Hasn't lost any weight Has improved his eating---not snacking at night Checks sugars every other day--- 140-198 fasting Did increase insulin back up to 50 units  Plans to work another year or so Not eligible for full SSI quite yet  Current Outpatient Medications on File Prior to Visit  Medication Sig Dispense Refill  . aspirin 81 MG tablet Take 81 mg by mouth daily.    . cetirizine (ZYRTEC) 10 MG tablet Take 1 tablet (10 mg total) by mouth daily as needed for allergies. 90 tablet 3  . Cyanocobalamin (B-12 PO) Take 1 tablet by mouth daily.     Marland Kitchen FARXIGA 10 MG TABS tablet TAKE 1 TABLET BY MOUTH ONCE DAILY 90 tablet 3  . lisinopril (PRINIVIL,ZESTRIL) 10 MG tablet TAKE 1 TABLET BY MOUTH ONCE A DAY 90 tablet 3  . metFORMIN (GLUCOPHAGE) 1000 MG tablet TAKE 1 TABLET BY MOUTH TWICE A DAY WITH MEALS 60 tablet 11  . sildenafil (REVATIO) 20 MG tablet Take 3 tablets (60 mg total) by mouth daily as needed. Prn 50 tablet 11  . simvastatin (ZOCOR) 20 MG tablet TAKE 1 TABLET BY MOUTH EVERY NIGHT AT BEDTIME 90 tablet 3   No current facility-administered medications on file prior to visit.     Allergies  Allergen Reactions  . Pioglitazone Rash    Past Medical History:  Diagnosis Date  . Allergy   . Diabetes mellitus ~2006  . Hyperlipidemia   . Personal history of urinary calculi   . Vertigo   . Vitamin B12 deficiency     Past Surgical History:  Procedure Laterality Date  . traumatic injury and repair of RT hand  1974    Family History  Problem Relation Age of Onset  . Heart disease Mother        CAD/PCI with stents; cardiomyopathy with AICD  . Cancer Other        Colon and Prostate  . Colon cancer Neg Hx     Social History   Socioeconomic History  . Marital status: Married      Spouse name: Not on file  . Number of children: 2  . Years of education: Not on file  . Highest education level: Not on file  Occupational History  . Occupation: Maintenance-- temperature controls    Employer: [employer]  Social Needs  . Financial resource strain: Not on file  . Food insecurity:    Worry: Not on file    Inability: Not on file  . Transportation needs:    Medical: Not on file    Non-medical: Not on file  Tobacco Use  . Smoking status: Never Smoker  . Smokeless tobacco: Former Systems developer    Types: Chew  Substance and Sexual Activity  . Alcohol use: No  . Drug use: No  . Sexual activity: Yes  Lifestyle  . Physical activity:    Days per week: Not on file    Minutes per session: Not on file  . Stress: Not on file  Relationships  . Social connections:    Talks on phone: Not on file    Gets together: Not on file    Attends religious service: Not on file    Active member of club or organization:  Not on file    Attends meetings of clubs or organizations: Not on file    Relationship status: Not on file  . Intimate partner violence:    Fear of current or ex partner: Not on file    Emotionally abused: Not on file    Physically abused: Not on file    Forced sexual activity: Not on file  Other Topics Concern  . Not on file  Social History Narrative   HSG   Married 1979   1 son 80 1 daughter 42; 1 granddaughter Eliezer Lofts '07), 1 grandson (Seth '09)   Work: [employer]   Regular exercise: walking when he can   Caffeine use: coffee all day   Review of Systems  Constitutional: Negative for fatigue and unexpected weight change.       Wears seat belt  HENT: Positive for tinnitus. Negative for trouble swallowing.        Still waiting to get the bad tooth pulled High frequency hearing loss  Eyes: Negative for visual disturbance.       No diplopia or unilateral vision loss  Respiratory: Negative for chest tightness and shortness of breath.         Wife feels he coughs---has seen pulmonary (?reflux)  Cardiovascular: Negative for chest pain and leg swelling.  Gastrointestinal: Negative for blood in stool and constipation.       Only uses omeprazole prn  Endocrine: Negative for polydipsia.  Genitourinary: Positive for frequency. Negative for difficulty urinating.       Libido down  Musculoskeletal: Negative for arthralgias, back pain and joint swelling.  Skin: Negative for rash.  Allergic/Immunologic: Positive for environmental allergies. Negative for immunocompromised state.       Rarely uses meds  Neurological: Negative for dizziness, syncope, light-headedness and headaches.  Hematological: Negative for adenopathy. Does not bruise/bleed easily.  Psychiatric/Behavioral: Negative for dysphoric mood and sleep disturbance. The patient is not nervous/anxious.        Nocturia x 1-2       Objective:   Physical Exam  Constitutional: He appears well-developed. No distress.  HENT:  Head: Normocephalic and atraumatic.  Right Ear: External ear normal.  Left Ear: External ear normal.  Mouth/Throat: Oropharynx is clear and moist. No oropharyngeal exudate.  Eyes: Pupils are equal, round, and reactive to light. Conjunctivae are normal.  Neck: No thyromegaly present.  Cardiovascular: Normal rate, regular rhythm, normal heart sounds and intact distal pulses. Exam reveals no gallop.  No murmur heard. Respiratory: Effort normal and breath sounds normal. No respiratory distress. He has no wheezes. He has no rales.  GI: Soft. There is no tenderness.  Musculoskeletal: He exhibits no edema or tenderness.  Lymphadenopathy:    He has no cervical adenopathy.  Neurological:  Normal sensation in feet  Skin: No rash noted.  No foot lesions  Psychiatric: He has a normal mood and affect. His behavior is normal.           Assessment & Plan:

## 2017-11-29 NOTE — Assessment & Plan Note (Signed)
On CT scan ASA and statin

## 2017-11-29 NOTE — Assessment & Plan Note (Signed)
Colon due 2020 Defer PSA Had flu vaccine Discussed exercise

## 2018-02-28 ENCOUNTER — Other Ambulatory Visit: Payer: Self-pay | Admitting: Internal Medicine

## 2018-04-10 ENCOUNTER — Other Ambulatory Visit: Payer: Self-pay | Admitting: Internal Medicine

## 2018-04-25 ENCOUNTER — Telehealth: Payer: Self-pay | Admitting: Internal Medicine

## 2018-04-25 ENCOUNTER — Encounter: Payer: Self-pay | Admitting: Internal Medicine

## 2018-04-25 NOTE — Telephone Encounter (Signed)
Patient's wife notified letter is ready for pick up.

## 2018-04-25 NOTE — Telephone Encounter (Signed)
Note written No charge

## 2018-04-25 NOTE — Telephone Encounter (Signed)
Best number [phone] or [phone]  Spouse (linda) called stating pt works for [employer] system.  He can work from home but needs a note from MD.  Pt is a diabetic and 84.  Please advise when ready for pick up

## 2018-05-25 ENCOUNTER — Other Ambulatory Visit: Payer: Self-pay | Admitting: Internal Medicine

## 2018-05-25 DIAGNOSIS — Z794 Long term (current) use of insulin: Principal | ICD-10-CM

## 2018-05-25 DIAGNOSIS — E0859 Diabetes mellitus due to underlying condition with other circulatory complications: Secondary | ICD-10-CM

## 2018-05-31 ENCOUNTER — Other Ambulatory Visit (INDEPENDENT_AMBULATORY_CARE_PROVIDER_SITE_OTHER): Payer: BC Managed Care – PPO

## 2018-05-31 ENCOUNTER — Other Ambulatory Visit: Payer: Self-pay

## 2018-05-31 DIAGNOSIS — Z794 Long term (current) use of insulin: Secondary | ICD-10-CM | POA: Diagnosis not present

## 2018-05-31 DIAGNOSIS — E0859 Diabetes mellitus due to underlying condition with other circulatory complications: Secondary | ICD-10-CM

## 2018-05-31 LAB — HEMOGLOBIN A1C: Hgb A1c MFr Bld: 8.1 % — ABNORMAL HIGH (ref 4.6–6.5)

## 2018-06-01 ENCOUNTER — Telehealth: Payer: Self-pay | Admitting: Internal Medicine

## 2018-06-01 ENCOUNTER — Ambulatory Visit (INDEPENDENT_AMBULATORY_CARE_PROVIDER_SITE_OTHER): Payer: BC Managed Care – PPO | Admitting: Internal Medicine

## 2018-06-01 ENCOUNTER — Encounter: Payer: Self-pay | Admitting: Internal Medicine

## 2018-06-01 VITALS — Wt 225.0 lb

## 2018-06-01 DIAGNOSIS — E0859 Diabetes mellitus due to underlying condition with other circulatory complications: Secondary | ICD-10-CM

## 2018-06-01 DIAGNOSIS — Z794 Long term (current) use of insulin: Secondary | ICD-10-CM

## 2018-06-01 MED ORDER — INSULIN DEGLUDEC 100 UNIT/ML ~~LOC~~ SOPN: 50.0000 [IU] | PEN_INJECTOR | Freq: Every day | SUBCUTANEOUS | 11 refills | Status: DC

## 2018-06-01 NOTE — Progress Notes (Signed)
Subjective:    Patient ID: Aaron Zimmerman, male    DOB: 04-Jun-1950, 68 y.o.   MRN: [id number]  HPI Virtual visit for follow up of diabetes Identification done Reviewed billing and he gave his consent He is at home and I am in my office  Now working at home Has remote access to the schools so he can do his job then  Did see his A1c is up Hasn't been checking sugars regularly---but more in past 3 weeks 90's- 190's He is not great with his compliance with eating Sugars usually high if he needs a night snack  Restarted exercise---riding bike-- about a month ago Planned to join Y---then it closed  Current Outpatient Medications on File Prior to Visit  Medication Sig Dispense Refill  . aspirin 81 MG tablet Take 81 mg by mouth daily.    . cetirizine (ZYRTEC) 10 MG tablet Take 1 tablet (10 mg total) by mouth daily as needed for allergies. 90 tablet 3  . Cyanocobalamin (B-12 PO) Take 1 tablet by mouth daily.     Marland Kitchen FARXIGA 10 MG TABS tablet TAKE 1 TABLET BY MOUTH ONCE DAILY 90 tablet 3  . lisinopril (PRINIVIL,ZESTRIL) 10 MG tablet TAKE 1 TABLET BY MOUTH ONCE A DAY 90 tablet 3  . metFORMIN (GLUCOPHAGE) 1000 MG tablet TAKE 1 TABLET BY MOUTH TWICE A DAY WITH MEALS 60 tablet 11  . sildenafil (REVATIO) 20 MG tablet Take 3 tablets (60 mg total) by mouth daily as needed. Prn 50 tablet 11  . simvastatin (ZOCOR) 20 MG tablet TAKE 1 TABLET BY MOUTH EVERY NIGHT AT BEDTIME 90 tablet 3  . TRESIBA FLEXTOUCH 100 UNIT/ML SOPN FlexTouch Pen INJECT 40 UNITS INTO THE SKIN ONCE DAILY 15 mL 11   No current facility-administered medications on file prior to visit.     Allergies  Allergen Reactions  . Pioglitazone Rash    Past Medical History:  Diagnosis Date  . Allergy   . Diabetes mellitus ~2006  . Hyperlipidemia   . Personal history of urinary calculi   . Vertigo   . Vitamin B12 deficiency     Past Surgical History:  Procedure Laterality Date  . traumatic injury and repair of RT hand   1974    Family History  Problem Relation Age of Onset  . Heart disease Mother        CAD/PCI with stents; cardiomyopathy with AICD  . Cancer Other        Colon and Prostate  . Colon cancer Neg Hx     Social History   Socioeconomic History  . Marital status: Married    Spouse name: Not on file  . Number of children: 2  . Years of education: Not on file  . Highest education level: Not on file  Occupational History  . Occupation: Maintenance-- temperature controls    Employer: [employer]  Social Needs  . Financial resource strain: Not on file  . Food insecurity:    Worry: Not on file    Inability: Not on file  . Transportation needs:    Medical: Not on file    Non-medical: Not on file  Tobacco Use  . Smoking status: Never Smoker  . Smokeless tobacco: Former Systems developer    Types: Chew  Substance and Sexual Activity  . Alcohol use: No  . Drug use: No  . Sexual activity: Yes  Lifestyle  . Physical activity:    Days per week: Not on file  Minutes per session: Not on file  . Stress: Not on file  Relationships  . Social connections:    Talks on phone: Not on file    Gets together: Not on file    Attends religious service: Not on file    Active member of club or organization: Not on file    Attends meetings of clubs or organizations: Not on file    Relationship status: Not on file  . Intimate partner violence:    Fear of current or ex partner: Not on file    Emotionally abused: Not on file    Physically abused: Not on file    Forced sexual activity: Not on file  Other Topics Concern  . Not on file  Social History Narrative   HSG   Married 1979   1 son 64 1 daughter 88; 1 granddaughter Eliezer Lofts '07), 1 grandson (Seth '09)   Work: [employer]   Regular exercise: walking when he can   Caffeine use: coffee all day   Review of Systems Sleeps well Appetite is okay---too good Weight is about the same     Objective:   Physical Exam   Constitutional: He appears well-developed. No distress.  Respiratory: Effort normal. No respiratory distress.  Psychiatric: He has a normal mood and affect. His behavior is normal.           Assessment & Plan:

## 2018-06-01 NOTE — Telephone Encounter (Signed)
I left a message on patient's voice mail to call back and schedule cpx.  Patient's appointment needs to be after 11/30/18.

## 2018-06-01 NOTE — Assessment & Plan Note (Signed)
Control has slipped with dietary indiscretion Will hold off on new meds (but could consider liraglutide or equivalent) He will be more careful and increase his exercise Known vascular disease--no symptoms. Is on statin

## 2018-06-01 NOTE — Telephone Encounter (Signed)
Appointment 10/28 Pt aware

## 2018-06-15 ENCOUNTER — Encounter: Payer: Self-pay | Admitting: Gastroenterology

## 2018-06-25 LAB — HM DIABETES EYE EXAM

## 2018-07-11 ENCOUNTER — Encounter: Payer: Self-pay | Admitting: Gastroenterology

## 2018-07-20 ENCOUNTER — Other Ambulatory Visit: Payer: Self-pay | Admitting: Internal Medicine

## 2018-08-22 ENCOUNTER — Ambulatory Visit: Payer: BC Managed Care – PPO | Admitting: *Deleted

## 2018-08-22 ENCOUNTER — Other Ambulatory Visit: Payer: Self-pay

## 2018-08-22 VITALS — Ht 69.0 in | Wt 226.0 lb

## 2018-08-22 DIAGNOSIS — Z86010 Personal history of colonic polyps: Secondary | ICD-10-CM

## 2018-08-22 MED ORDER — NA SULFATE-K SULFATE-MG SULF 17.5-3.13-1.6 GM/177ML PO SOLN: 1.0000 | Freq: Once | ORAL | 0 refills | Status: AC

## 2018-08-22 NOTE — Progress Notes (Signed)

## 2018-09-04 ENCOUNTER — Telehealth: Payer: Self-pay | Admitting: Gastroenterology

## 2018-09-04 NOTE — Telephone Encounter (Signed)

## 2018-09-05 ENCOUNTER — Ambulatory Visit (AMBULATORY_SURGERY_CENTER): Payer: BC Managed Care – PPO | Admitting: Gastroenterology

## 2018-09-05 ENCOUNTER — Other Ambulatory Visit: Payer: Self-pay

## 2018-09-05 ENCOUNTER — Encounter: Payer: Self-pay | Admitting: Gastroenterology

## 2018-09-05 VITALS — BP 114/64 | HR 64 | Temp 99.0°F | Resp 19 | Ht 69.0 in | Wt 226.0 lb

## 2018-09-05 DIAGNOSIS — D125 Benign neoplasm of sigmoid colon: Secondary | ICD-10-CM | POA: Diagnosis not present

## 2018-09-05 DIAGNOSIS — D123 Benign neoplasm of transverse colon: Secondary | ICD-10-CM | POA: Diagnosis not present

## 2018-09-05 DIAGNOSIS — D128 Benign neoplasm of rectum: Secondary | ICD-10-CM

## 2018-09-05 DIAGNOSIS — Z86010 Personal history of colonic polyps: Secondary | ICD-10-CM

## 2018-09-05 MED ORDER — SODIUM CHLORIDE 0.9 % IV SOLN: 500.0000 mL | Freq: Once | INTRAVENOUS | Status: DC

## 2018-09-05 NOTE — Progress Notes (Signed)
Called to room to assist during endoscopic procedure.  Patient ID and intended procedure confirmed with present staff. Received instructions for my participation in the procedure from the performing physician.  

## 2018-09-05 NOTE — Progress Notes (Signed)
[name]-vital signs. [name].

## 2018-09-05 NOTE — Progress Notes (Signed)
PT taken to PACU. Monitors in place. VSS. Report given to RN. 

## 2018-09-05 NOTE — Progress Notes (Signed)
Pt's states no medical or surgical changes since previsit or office visit. 

## 2018-09-05 NOTE — Patient Instructions (Signed)
Handouts Provided:  Polyps  YOU HAD AN ENDOSCOPIC PROCEDURE TODAY AT [facility]:   Refer to the procedure report that was given to you for any specific questions about what was found during the examination.  If the procedure report does not answer your questions, please call your gastroenterologist to clarify.  If you requested that your care partner not be given the details of your procedure findings, then the procedure report has been included in a sealed envelope for you to review at your convenience later.  YOU SHOULD EXPECT: Some feelings of bloating in the abdomen. Passage of more gas than usual.  Walking can help get rid of the air that was put into your GI tract during the procedure and reduce the bloating. If you had a lower endoscopy (such as a colonoscopy or flexible sigmoidoscopy) you may notice spotting of blood in your stool or on the toilet paper. If you underwent a bowel prep for your procedure, you may not have a normal bowel movement for a few days.  Please Note:  You might notice some irritation and congestion in your nose or some drainage.  This is from the oxygen used during your procedure.  There is no need for concern and it should clear up in a day or so.  SYMPTOMS TO REPORT IMMEDIATELY:   Following lower endoscopy (colonoscopy or flexible sigmoidoscopy):  Excessive amounts of blood in the stool  Significant tenderness or worsening of abdominal pains  Swelling of the abdomen that is new, acute  Fever of 100F or higher  For urgent or emergent issues, a gastroenterologist can be reached at any hour by calling [phone].   DIET:  We do recommend a small meal at first, but then you may proceed to your regular diet.  Drink plenty of fluids but you should avoid alcoholic beverages for 24 hours.  ACTIVITY:  You should plan to take it easy for the rest of today and you should NOT DRIVE or use heavy machinery until tomorrow (because of the sedation  medicines used during the test).    FOLLOW UP: Our staff will call the number listed on your records 48-72 hours following your procedure to check on you and address any questions or concerns that you may have regarding the information given to you following your procedure. If we do not reach you, we will leave a message.  We will attempt to reach you two times.  During this call, we will ask if you have developed any symptoms of COVID 19. If you develop any symptoms (ie: fever, flu-like symptoms, shortness of breath, cough etc.) before then, please call [phone].  If you test positive for Covid 19 in the 2 weeks post procedure, please call and report this information to [place].    If any biopsies were taken you will be contacted by phone or by letter within the next 1-3 weeks.  Please call us at [phone] if you have not heard about the biopsies in 3 weeks.    SIGNATURES/CONFIDENTIALITY: You and/or your care partner have signed paperwork which will be entered into your electronic medical record.  These signatures attest to the fact that that the information above on your After Visit Summary has been reviewed and is understood.  Full responsibility of the confidentiality of this discharge information lies with you and/or your care-partner.

## 2018-09-05 NOTE — Op Note (Signed)
[facility] Patient Name: Aaron Zimmerman Procedure Date: 09/05/2018 7:40 AM MRN: [id number] Endoscopist: Mauri Pole , MD Age: 68 Referring MD:  Date of Birth: November 14, 1950 Gender: Male Account #: [id number] Procedure:                Colonoscopy Indications:              High risk colon cancer surveillance: Personal                            history of colonic polyps Medicines:                Monitored Anesthesia Care Procedure:                Pre-Anesthesia Assessment:                           - Prior to the procedure, a History and Physical                            was performed, and patient medications and                            allergies were reviewed. The patient's tolerance of                            previous anesthesia was also reviewed. The risks                            and benefits of the procedure and the sedation                            options and risks were discussed with the patient.                            All questions were answered, and informed consent                            was obtained. Prior Anticoagulants: The patient has                            taken no previous anticoagulant or antiplatelet                            agents. ASA Grade Assessment: III - A patient with                            severe systemic disease. After reviewing the risks                            and benefits, the patient was deemed in                            satisfactory condition to undergo the procedure.  After obtaining informed consent, the colonoscope                            was passed under direct vision. Throughout the                            procedure, the patient's blood pressure, pulse, and                            oxygen saturations were monitored continuously. The                            Colonoscope was introduced through the anus and                            advanced to the the cecum,  identified by                            appendiceal orifice and ileocecal valve. The                            colonoscopy was performed without difficulty. The                            patient tolerated the procedure well. The quality                            of the bowel preparation was excellent. The                            ileocecal valve, appendiceal orifice, and rectum                            were photographed. Scope In: 7:47:28 AM Scope Out: 8:08:27 AM Scope Withdrawal Time: 0 hours 14 minutes 37 seconds  Total Procedure Duration: 0 hours 20 minutes 59 seconds  Findings:                 The perianal and digital rectal examinations were                            normal.                           A 5 mm polyp was found in the transverse colon. The                            polyp was sessile. The polyp was removed with a                            cold snare. Resection and retrieval were complete.                           Two sessile polyps were found in the rectum and  sigmoid colon. The polyps were 1 to 2 mm in size.                            These polyps were removed with a cold biopsy                            forceps. Resection and retrieval were complete.                           Scattered small and large-mouthed diverticula were                            found in the sigmoid colon, descending colon,                            transverse colon and ascending colon.                           Non-bleeding internal hemorrhoids were found during                            retroflexion. The hemorrhoids were small. Complications:            No immediate complications. Estimated Blood Loss:     Estimated blood loss was minimal. Impression:               - One 5 mm polyp in the transverse colon, removed                            with a cold snare. Resected and retrieved.                           - Two 1 to 2 mm polyps in the rectum and in  the                            sigmoid colon, removed with a cold biopsy forceps.                            Resected and retrieved.                           - Moderate diverticulosis in the sigmoid colon, in                            the descending colon, in the transverse colon and                            in the ascending colon.                           - Non-bleeding internal hemorrhoids. Recommendation:           - Patient has a contact number available for  emergencies. The signs and symptoms of potential                            delayed complications were discussed with the                            patient. Return to normal activities tomorrow.                            Written discharge instructions were provided to the                            patient.                           - Resume previous diet.                           - Continue present medications.                           - Await pathology results.                           - Repeat colonoscopy in 3 - 5 years for                            surveillance based on pathology results. Mauri Pole, MD 09/05/2018 8:15:31 AM This report has been signed electronically.

## 2018-09-07 ENCOUNTER — Telehealth: Payer: Self-pay

## 2018-09-07 NOTE — Telephone Encounter (Signed)
Covid-19 screening questions   Do you now or have you had a fever in the last 14 days? No.  Do you have any respiratory symptoms of shortness of breath or cough now or in the last 14 days? No.  Do you have any family members or close contacts with diagnosed or suspected Covid-19 in the past 14 days? No.  Have you been tested for Covid-19 and found to be positive? No.      Follow up Call-  Call back number 09/05/2018 10/07/2016  Post procedure Call Back phone  # [phone]  Permission to leave phone message Yes Yes  Some recent data might be hidden     Patient questions:  Do you have a fever, pain , or abdominal swelling? No. Pain Score  0 *  Have you tolerated food without any problems? Yes.    Have you been able to return to your normal activities? Yes.    Do you have any questions about your discharge instructions: Diet   No. Medications  No. Follow up visit  No.  Do you have questions or concerns about your Care? No.  Actions: * If pain score is 4 or above: No action needed, pain <4.

## 2018-09-18 ENCOUNTER — Encounter: Payer: Self-pay | Admitting: Gastroenterology

## 2018-12-04 ENCOUNTER — Other Ambulatory Visit: Payer: Self-pay | Admitting: Internal Medicine

## 2018-12-05 ENCOUNTER — Ambulatory Visit (INDEPENDENT_AMBULATORY_CARE_PROVIDER_SITE_OTHER): Payer: BC Managed Care – PPO | Admitting: Internal Medicine

## 2018-12-05 ENCOUNTER — Encounter: Payer: Self-pay | Admitting: Internal Medicine

## 2018-12-05 ENCOUNTER — Other Ambulatory Visit: Payer: Self-pay

## 2018-12-05 VITALS — BP 118/70 | HR 66 | Temp 98.3°F | Ht 68.5 in | Wt 228.0 lb

## 2018-12-05 DIAGNOSIS — Z125 Encounter for screening for malignant neoplasm of prostate: Secondary | ICD-10-CM

## 2018-12-05 DIAGNOSIS — E538 Deficiency of other specified B group vitamins: Secondary | ICD-10-CM

## 2018-12-05 DIAGNOSIS — Z0000 Encounter for general adult medical examination without abnormal findings: Secondary | ICD-10-CM

## 2018-12-05 DIAGNOSIS — Z23 Encounter for immunization: Secondary | ICD-10-CM

## 2018-12-05 DIAGNOSIS — I10 Essential (primary) hypertension: Secondary | ICD-10-CM | POA: Diagnosis not present

## 2018-12-05 DIAGNOSIS — E1140 Type 2 diabetes mellitus with diabetic neuropathy, unspecified: Secondary | ICD-10-CM

## 2018-12-05 DIAGNOSIS — E1165 Type 2 diabetes mellitus with hyperglycemia: Secondary | ICD-10-CM

## 2018-12-05 DIAGNOSIS — IMO0002 Reserved for concepts with insufficient information to code with codable children: Secondary | ICD-10-CM

## 2018-12-05 LAB — COMPREHENSIVE METABOLIC PANEL
ALT: 26 U/L (ref 0–53)
AST: 22 U/L (ref 0–37)
Albumin: 4.8 g/dL (ref 3.5–5.2)
Alkaline Phosphatase: 51 U/L (ref 39–117)
BUN: 13 mg/dL (ref 6–23)
CO2: 31 mEq/L (ref 19–32)
Calcium: 9.9 mg/dL (ref 8.4–10.5)
Chloride: 102 mEq/L (ref 96–112)
Creatinine, Ser: 0.95 mg/dL (ref 0.40–1.50)
GFR: 79.3 mL/min (ref >60.00)
Glucose, Bld: 92 mg/dL (ref 70–99)
Potassium: 4.4 mEq/L (ref 3.5–5.1)
Sodium: 141 mEq/L (ref 135–145)
Total Bilirubin: 0.9 mg/dL (ref 0.2–1.2)
Total Protein: 7.2 g/dL (ref 6.0–8.3)

## 2018-12-05 LAB — LIPID PANEL
Cholesterol: 128 mg/dL (ref 0–200)
HDL: 34.4 mg/dL — ABNORMAL LOW (ref >39.00)
LDL Cholesterol: 63 mg/dL (ref 0–99)
NonHDL: 93.88
Total CHOL/HDL Ratio: 4
Triglycerides: 156 mg/dL — ABNORMAL HIGH (ref 0.0–149.0)
VLDL: 31.2 mg/dL (ref 0.0–40.0)

## 2018-12-05 LAB — CBC
HCT: 48.8 % (ref 39.0–52.0)
Hemoglobin: 16.2 g/dL (ref 13.0–17.0)
MCHC: 33.2 g/dL (ref 30.0–36.0)
MCV: 85.6 fl (ref 78.0–100.0)
Platelets: 198 10*3/uL (ref 150.0–400.0)
RBC: 5.69 Mil/uL (ref 4.22–5.81)
RDW: 13 % (ref 11.5–15.5)
WBC: 6.4 10*3/uL (ref 4.0–10.5)

## 2018-12-05 LAB — HEMOGLOBIN A1C: Hgb A1c MFr Bld: 8.1 % — ABNORMAL HIGH (ref 4.6–6.5)

## 2018-12-05 MED ORDER — ASPIRIN 81 MG PO TBEC: 81.0000 mg | DELAYED_RELEASE_TABLET | Freq: Every day | ORAL | 4 refills | Status: DC

## 2018-12-05 NOTE — Assessment & Plan Note (Signed)
Hopefully control will be better Increase insulin prn Very early neuropathy symptoms

## 2018-12-05 NOTE — Progress Notes (Signed)
Subjective:    Patient ID: Aaron Zimmerman, male    DOB: Dec 10, 1950, 68 y.o.   MRN: [id number]  HPI Here for physical  Checks sugars daily 130-150 usually Rare low sugar feeling----could be 85 Surprised that he gained a few pounds Taking 49 units of insulin Walks dog/stays "busy"---but no set exercise No foot numbness or pain---but does have brief spells of tingling episodically  May retire next spring  Current Outpatient Medications on File Prior to Visit  Medication Sig Dispense Refill  . cetirizine (ZYRTEC) 10 MG tablet Take 1 tablet (10 mg total) by mouth daily as needed for allergies. 90 tablet 3  . Cyanocobalamin (B-12 PO) Take 1 tablet by mouth daily.     Marland Kitchen FARXIGA 10 MG TABS tablet TAKE 1 TABLET BY MOUTH ONCE DAILY 90 tablet 3  . insulin degludec (TRESIBA FLEXTOUCH) 100 UNIT/ML SOPN FlexTouch Pen Inject 0.5 mLs (50 Units total) into the skin daily. 15 mL 11  . lisinopril (ZESTRIL) 10 MG tablet TAKE 1 TABLET BY MOUTH ONCE DAILY 90 tablet 3  . metFORMIN (GLUCOPHAGE) 1000 MG tablet TAKE 1 TABLET BY MOUTH TWICE A DAY WITH MEALS 60 tablet 11  . sildenafil (REVATIO) 20 MG tablet Take 3 tablets (60 mg total) by mouth daily as needed. Prn 50 tablet 11  . simvastatin (ZOCOR) 20 MG tablet TAKE 1 TABLET BY MOUTH EVERY NIGHT AT BEDTIME 90 tablet 3   No current facility-administered medications on file prior to visit.     Allergies  Allergen Reactions  . Pioglitazone Rash    Past Medical History:  Diagnosis Date  . Allergy   . Cataract    left eye  . Diabetes mellitus ~2006  . Hyperlipidemia   . Hypertension    take blood pressure medication preventive percaution  . Personal history of urinary calculi   . Vertigo   . Vitamin B12 deficiency     Past Surgical History:  Procedure Laterality Date  . COLONOSCOPY    . POLYPECTOMY    . traumatic injury and repair of RT hand  1974    Family History  Problem Relation Age of Onset  . Heart disease Mother    CAD/PCI with stents; cardiomyopathy with AICD  . Cancer Other        Colon and Prostate  . Colon cancer Neg Hx   . Colon polyps Neg Hx   . Esophageal cancer Neg Hx   . Rectal cancer Neg Hx   . Stomach cancer Neg Hx     Social History   Socioeconomic History  . Marital status: Married    Spouse name: Not on file  . Number of children: 2  . Years of education: Not on file  . Highest education level: Not on file  Occupational History  . Occupation: Maintenance-- temperature controls    Employer: [employer]  Social Needs  . Financial resource strain: Not on file  . Food insecurity    Worry: Not on file    Inability: Not on file  . Transportation needs    Medical: Not on file    Non-medical: Not on file  Tobacco Use  . Smoking status: Never Smoker  . Smokeless tobacco: Former Systems developer    Types: Chew  Substance and Sexual Activity  . Alcohol use: No  . Drug use: No  . Sexual activity: Yes  Lifestyle  . Physical activity    Days per week: Not on file    Minutes per  session: Not on file  . Stress: Not on file  Relationships  . Social Herbalist on phone: Not on file    Gets together: Not on file    Attends religious service: Not on file    Active member of club or organization: Not on file    Attends meetings of clubs or organizations: Not on file    Relationship status: Not on file  . Intimate partner violence    Fear of current or ex partner: Not on file    Emotionally abused: Not on file    Physically abused: Not on file    Forced sexual activity: Not on file  Other Topics Concern  . Not on file  Social History Narrative   HSG   Married 1979   1 son 62 1 daughter 77; 1 granddaughter Eliezer Lofts '07), 1 grandson (Seth '09)   Work: [employer]   Regular exercise: walking when he can   Caffeine use: coffee all day   Review of Systems  Constitutional: Negative for fatigue and unexpected weight change.       Wears seat belt   HENT: Negative for dental problem, tinnitus and trouble swallowing.        Done with dental work for now--may need partial plate Mild hearing loss--not enough to consider aide  Eyes: Negative for visual disturbance.       No diplopia or unilateral vision loss Getting cataract on left done next month  Respiratory: Negative for cough, chest tightness and shortness of breath.   Cardiovascular: Negative for chest pain, palpitations and leg swelling.  Gastrointestinal: Negative for blood in stool and constipation.       No heartburn of note--no meds  Endocrine: Negative for polydipsia and polyuria.  Genitourinary: Positive for urgency. Negative for difficulty urinating and frequency.       No sexual problems  Musculoskeletal: Negative for arthralgias, back pain and joint swelling.  Skin: Negative for rash.       No suspicious lesions  Allergic/Immunologic: Positive for environmental allergies. Negative for immunocompromised state.       Mild symptoms---zyrtec prn  Neurological: Negative for dizziness, syncope and light-headedness.  Hematological: Negative for adenopathy. Does not bruise/bleed easily.  Psychiatric/Behavioral: Negative for dysphoric mood and sleep disturbance. The patient is not nervous/anxious.        Objective:   Physical Exam  Constitutional: He is oriented to person, place, and time. He appears well-developed. No distress.  HENT:  Head: Normocephalic and atraumatic.  Right Ear: External ear normal.  Left Ear: External ear normal.  Mouth/Throat: Oropharynx is clear and moist. No oropharyngeal exudate.  Eyes: Pupils are equal, round, and reactive to light. Conjunctivae are normal.  Neck: No thyromegaly present.  Cardiovascular: Normal rate, regular rhythm, normal heart sounds and intact distal pulses. Exam reveals no gallop.  No murmur heard. Respiratory: Effort normal and breath sounds normal. No respiratory distress. He has no wheezes. He has no rales.  GI: Soft.  There is no abdominal tenderness.  Diastasis recti  Musculoskeletal:        General: No tenderness or edema.  Lymphadenopathy:    He has no cervical adenopathy.  Neurological: He is alert and oriented to person, place, and time.  Normal sensation in feet  Skin: No rash noted. No erythema.  No foot lesions  Psychiatric: He has a normal mood and affect. His behavior is normal.           Assessment &  Plan:

## 2018-12-05 NOTE — Addendum Note (Signed)
Addended by: Pilar Grammes on: 12/05/2018 11:41 AM   Modules accepted: Orders

## 2018-12-05 NOTE — Assessment & Plan Note (Signed)
Will recheck level

## 2018-12-05 NOTE — Assessment & Plan Note (Signed)
BP Readings from Last 3 Encounters:  12/05/18 118/70  09/05/18 114/64  11/29/17 112/74   Good control Due for labs

## 2018-12-05 NOTE — Assessment & Plan Note (Signed)
Healthy Discussed fitness Will check PSA Colon due again 2027 Td today Had flu vaccine already

## 2018-12-06 LAB — PSA: PSA: 3.41 ng/mL (ref 0.10–4.00)

## 2018-12-06 LAB — VITAMIN B12: Vitamin B-12: 314 pg/mL (ref 211–911)

## 2019-01-24 ENCOUNTER — Telehealth: Payer: Self-pay | Admitting: Internal Medicine

## 2019-01-24 NOTE — Telephone Encounter (Signed)
Pt left a voicemail on the machine requesting a call back from Dr Alla German CMA Needed to discuss something insurance related.   Please advise, thanks.

## 2019-01-24 NOTE — Telephone Encounter (Signed)
Pt said he is trying to get a Landscape architect from [employer]. Asked if we have seen the requests. I told him I do not usually see those types of things come my way. I will get with Dr Silvio Pate to see if he has seen anything from [facility]. He knows they were asking about a CT Scan he had within the last 1-2 years.

## 2019-01-25 NOTE — Telephone Encounter (Signed)
I have not seen anything. We need a signed release from him before we can give any information to an insurance company

## 2019-01-25 NOTE — Telephone Encounter (Signed)
Spoke to pt. He will let them know we did not receive it. He will come by and sign a release form.

## 2019-01-26 ENCOUNTER — Other Ambulatory Visit: Payer: Self-pay | Admitting: Internal Medicine

## 2019-02-08 HISTORY — PX: COLONOSCOPY: SHX174

## 2019-03-25 ENCOUNTER — Telehealth: Payer: Self-pay | Admitting: *Deleted

## 2019-03-25 DIAGNOSIS — E1165 Type 2 diabetes mellitus with hyperglycemia: Secondary | ICD-10-CM

## 2019-03-25 NOTE — Telephone Encounter (Signed)
There is no set diabetic diet, but the best for him is to limit carbohydrates (especially simple carbs like white bread and pasta, sweets, etc) as well as limiting calories to enable slow steady weight loss to his ideal weight (which is below 200#). Find out if he has ever had formal diabetic counseling.

## 2019-03-25 NOTE — Telephone Encounter (Signed)
Patient left a voicemail wanting to know what Dr. Silvio Pate thinks is the best diabetic diet for him to follow?

## 2019-03-25 NOTE — Telephone Encounter (Signed)
Spoke to pt. He had gone to the nutrition program at [facility] in the past. Needs something to plan it out for him. Explain what foods instead of just carb exchange or carb numbers.  He would like to see someone if insurance covers it.

## 2019-03-26 NOTE — Telephone Encounter (Signed)
Left detailed message on VM per DPR. 

## 2019-03-26 NOTE — Telephone Encounter (Signed)
Please let him know that I put in a referral for counseling and he should hear soon. I think his insurance will cover at least some visits

## 2019-04-01 ENCOUNTER — Telehealth: Payer: Self-pay | Admitting: *Deleted

## 2019-04-01 MED ORDER — PEN NEEDLES 32G X 6 MM MISC: 1.0000 | Freq: Every day | 3 refills | Status: AC

## 2019-04-01 NOTE — Telephone Encounter (Signed)
Rx sent electronically.  

## 2019-04-01 NOTE — Telephone Encounter (Signed)
Patient called stating that he needs a script sent to the pharmacy for the needles that fit his insulin. Patient stated that he went to [facility] Saturday and was told that they do not have any thing on file for the needles.

## 2019-04-03 ENCOUNTER — Telehealth: Payer: Self-pay | Admitting: *Deleted

## 2019-04-03 NOTE — Telephone Encounter (Signed)
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  Patent wants to know if Dr. Silvio Pate feels that it is safe for him to go back into the schools with his medical history?

## 2019-04-04 NOTE — Telephone Encounter (Signed)
Left detailed message on Vm per DPR °

## 2019-04-04 NOTE — Telephone Encounter (Signed)
I think it is relatively safe to return with masks, social distancing, hand washing, etc More importantly is whether he has gotten the COVID vaccine. If not, he should try to get it ASAP

## 2019-04-05 ENCOUNTER — Other Ambulatory Visit: Payer: Self-pay | Admitting: Internal Medicine

## 2019-04-15 IMAGING — CT CT ABD-PEL WO/W CM
2 of 10 series · 9 of 46 positions shown, 15 images · IV contrast (isovue)
Comparison: CT the abdomen and pelvis 06/03/2010.

CLINICAL DATA: 65-year-old male with history of bright red blood in
the urine.

EXAM:
CT ABDOMEN AND PELVIS WITHOUT AND WITH CONTRAST
TECHNIQUE: Multidetector CT imaging of the abdomen and pelvis was performed
following the standard protocol before and following the bolus
administration of intravenous contrast.
CONTRAST:  100 mL of Isovue 300.

[Series 2: abd/pel w/o · axial · non-contrast · 0.94mm/px · z∈[-491,-96]mm · 7 of 107 slices shown, 12 images]
[im 14/107  soft-tissue]
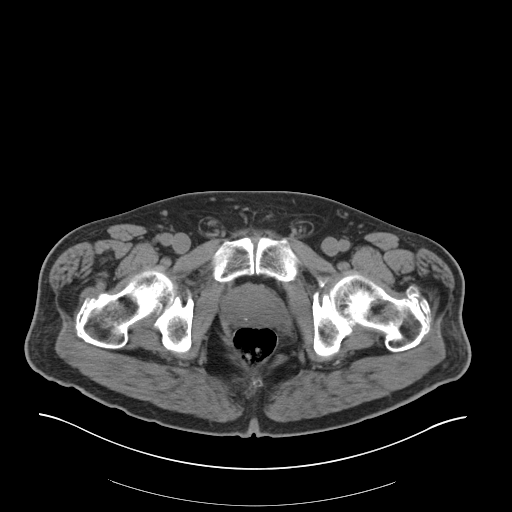
[im 14/107  bone]
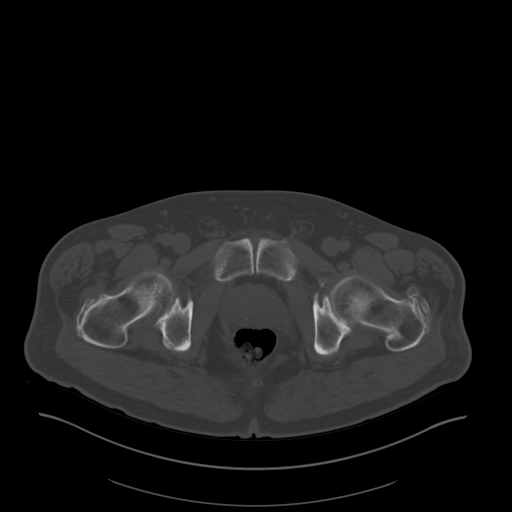
[im 27/107  soft-tissue]
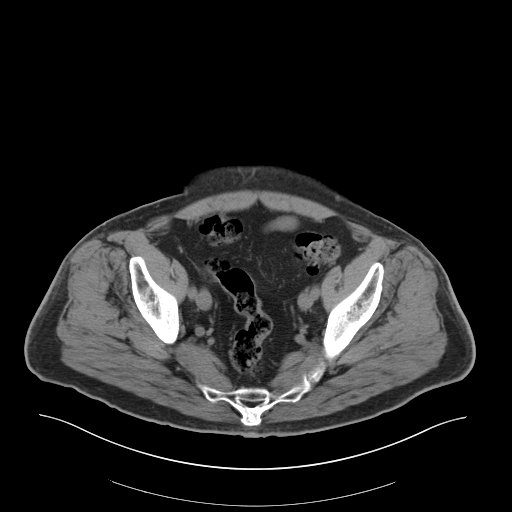
[im 40/107  soft-tissue]
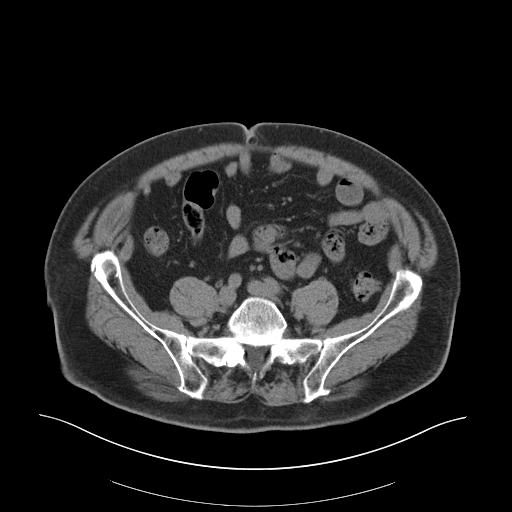
[im 54/107  soft-tissue]
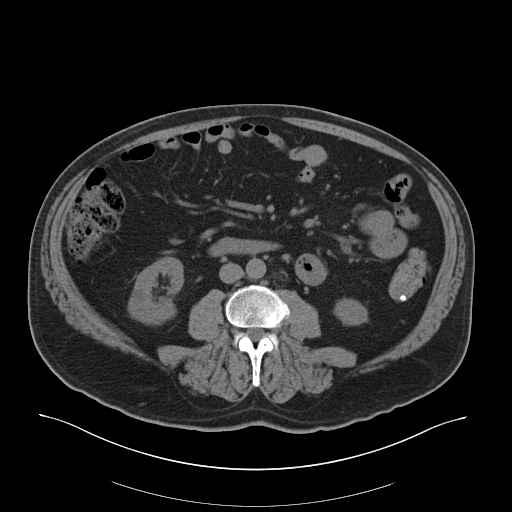
[im 54/107  lung]
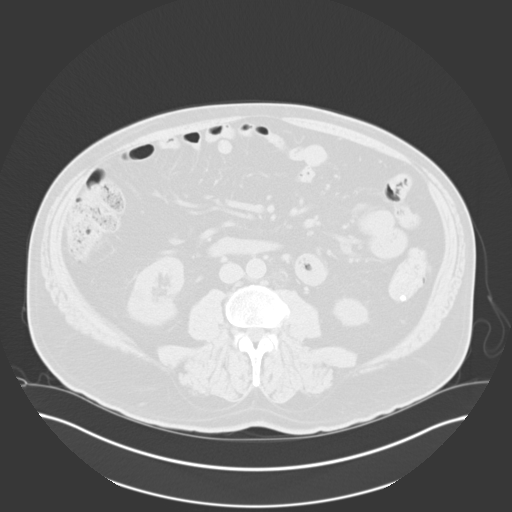
[im 67/107  soft-tissue]
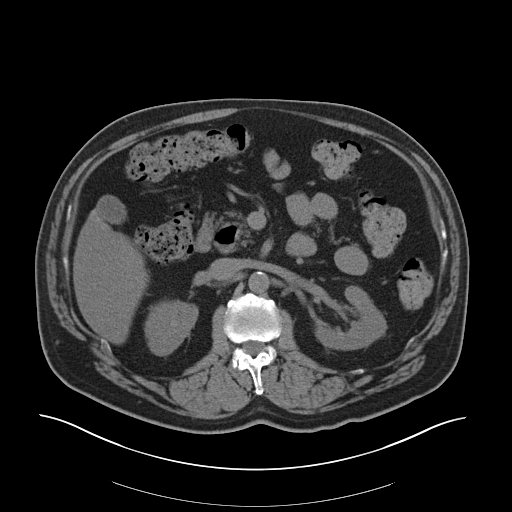
[im 67/107  lung]
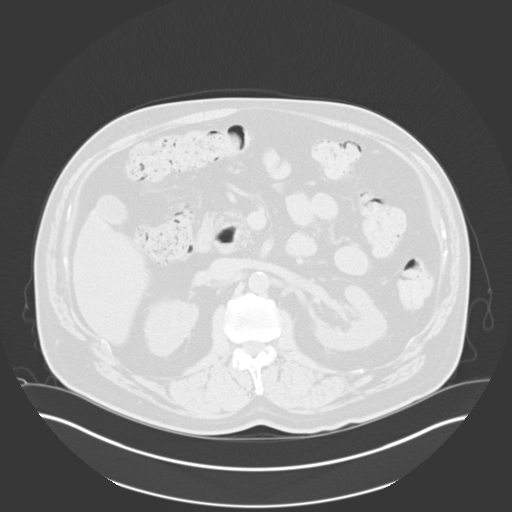
[im 80/107  soft-tissue]
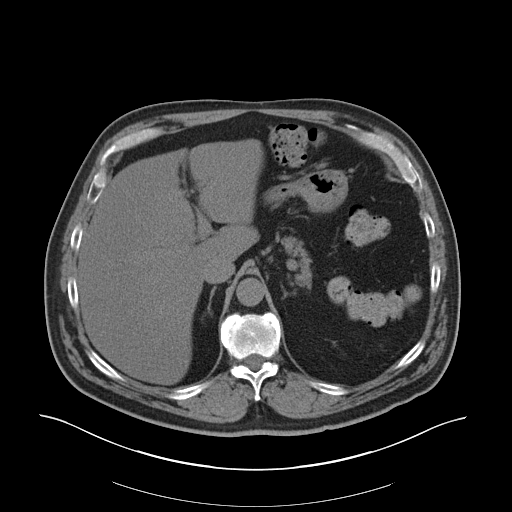
[im 80/107  lung]
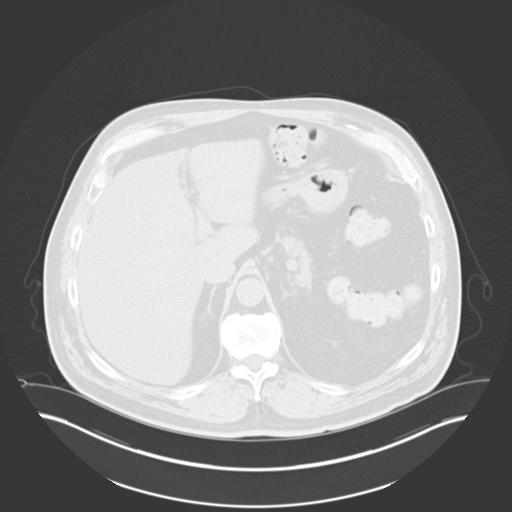
[im 93/107  soft-tissue]
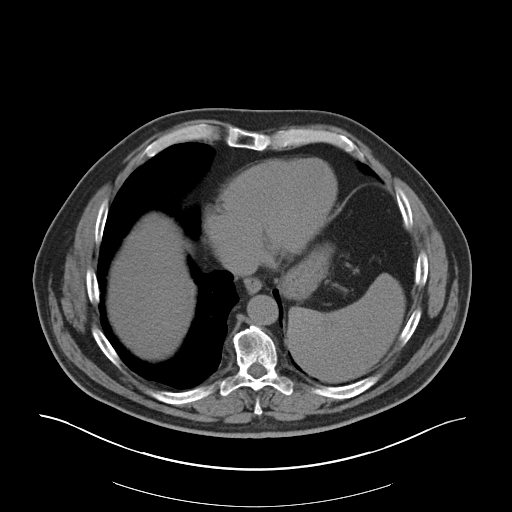
[im 93/107  lung]
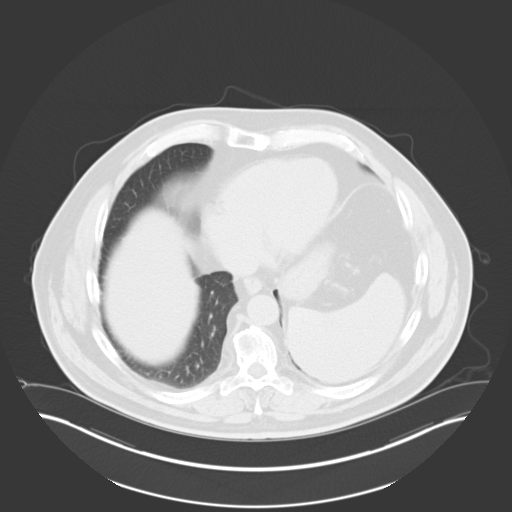

[Series 5: coronal · coronal · 0.91mm/px · 2 of 167 slices shown, 3 images]
[im 56/167  soft-tissue]
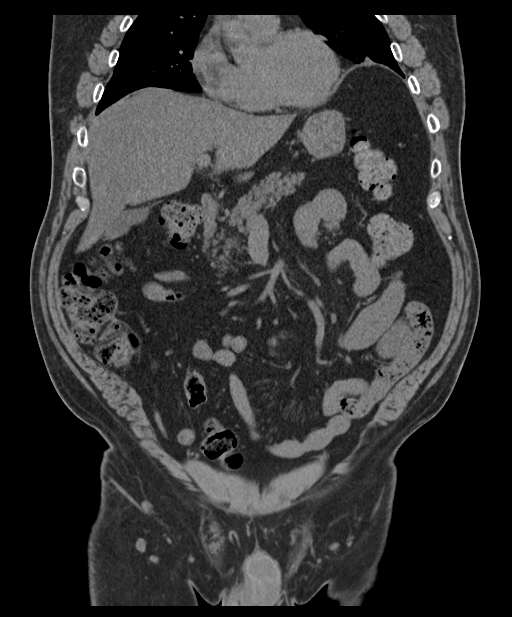
[im 56/167  bone]
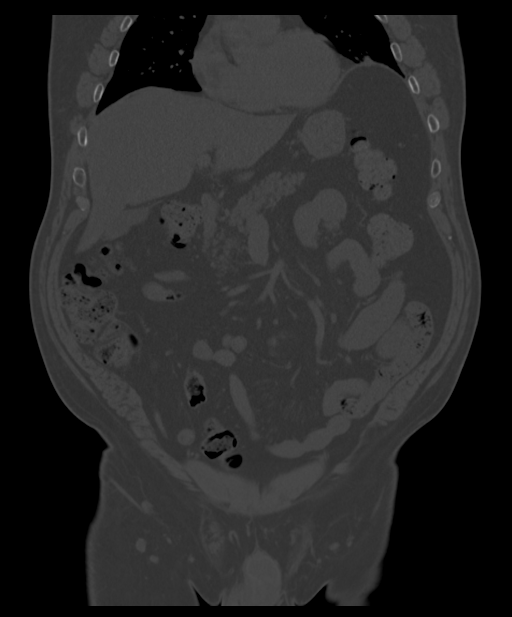
[im 111/167  soft-tissue]
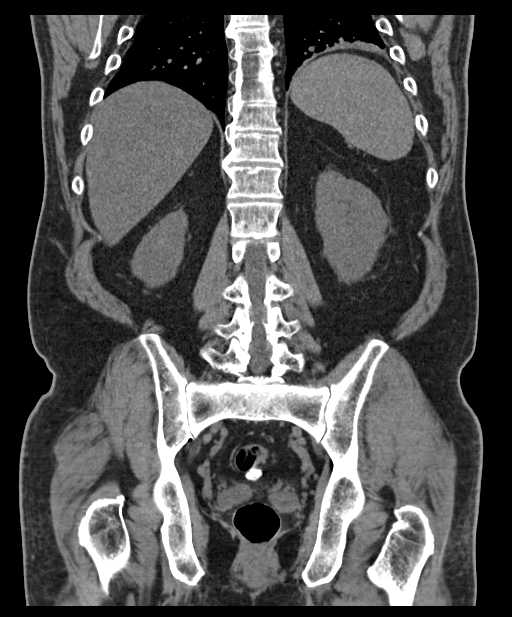

[9 of 46 positions shown; findings below may reference images not displayed]

FINDINGS: Lower chest: Elevation of the left hemidiaphragm. Areas of
subsegmental atelectasis in the inferior segment of the lingula and
in the left lower lobe. Calcifications of the left anterior
descending and right coronary arteries.

Hepatobiliary: Diffuse low attenuation throughout the hepatic
parenchyma, indicative of a background of severe hepatic steatosis.
No suspicious cystic or solid hepatic lesions. No intra or
extrahepatic biliary ductal dilatation. Gallbladder is normal in
appearance.

Pancreas: No pancreatic mass. No pancreatic ductal dilatation. No
pancreatic or peripancreatic fluid or inflammatory changes.

Spleen: Well-defined low-attenuation lesions in the spleen measuring
up to 2.4 cm in diameter, stable compared to prior study from 6386,
incompletely characterized but likely to represent small cysts.

Adrenals/Urinary Tract: Tiny calcifications are noted within the
collecting systems of both kidneys measuring up to 4 mm in the upper
pole collecting system of left kidney. No additional calculi are
noted along the course of either ureter or within the lumen of the
urinary bladder. No hydroureteronephrosis or perinephric stranding
is noted to suggest urinary tract obstruction at this time. 11 mm
low-attenuation lesion in the lateral aspect of the lower pole of
the right kidney is compatible with a simple cyst. Other
subcentimeter low-attenuation lesion in the lower pole the right
kidney is too small to definitively characterize, but is
statistically likely a tiny cyst. No suspicious appearing renal
lesions. On postcontrast delayed images there are no filling defects
within the collecting system of either kidney, along the course of
either ureter, or within the lumen of the urinary bladder to
strongly suggest presence of a urothelial neoplasm at this time.
Urinary bladder is normal in appearance.

Stomach/Bowel: Normal appearance of the stomach. No pathologic
dilatation of small bowel or colon. Normal appendix.

Vascular/Lymphatic: Aortic atherosclerosis, without evidence of
aneurysm or dissection in the abdominal or pelvic vasculature. No
lymphadenopathy noted in the abdomen or pelvis.

Reproductive: Prostate gland and seminal vesicles are unremarkable
in appearance.

Other: No significant volume of ascites.  No pneumoperitoneum.

Musculoskeletal: There are no aggressive appearing lytic or blastic
lesions noted in the visualized portions of the skeleton.
IMPRESSION: 1. Nonobstructive calculi are noted within the collecting systems of
both kidneys measuring up to 4 mm in the upper pole collecting
system of left kidney. No ureteral stones or findings of urinary
tract obstruction are noted at this time.
2. Small simple cysts in the right kidney. Additional subcentimeter
low-attenuation lesion in the right kidney is too small to
definitively characterize, but is statistically likely a tiny cyst.
3. Severe hepatic steatosis.
4. Aortic atherosclerosis, in addition to at least 2 vessel coronary
artery disease. Please note that although the presence of coronary
artery calcium documents the presence of coronary artery disease,
the severity of this disease and any potential stenosis cannot be
assessed on this non-gated CT examination. Assessment for potential
risk factor modification, dietary therapy or pharmacologic therapy
may be warranted, if clinically indicated.
5. Additional incidental findings, as above.

## 2019-05-03 ENCOUNTER — Other Ambulatory Visit: Payer: Self-pay | Admitting: Internal Medicine

## 2019-05-17 ENCOUNTER — Encounter: Payer: BC Managed Care – PPO | Attending: Internal Medicine | Admitting: Registered"

## 2019-05-17 ENCOUNTER — Encounter: Payer: Self-pay | Admitting: Registered"

## 2019-05-17 ENCOUNTER — Other Ambulatory Visit: Payer: Self-pay

## 2019-05-17 DIAGNOSIS — E1140 Type 2 diabetes mellitus with diabetic neuropathy, unspecified: Secondary | ICD-10-CM | POA: Insufficient documentation

## 2019-05-17 DIAGNOSIS — IMO0002 Reserved for concepts with insufficient information to code with codable children: Secondary | ICD-10-CM

## 2019-05-17 DIAGNOSIS — E1165 Type 2 diabetes mellitus with hyperglycemia: Secondary | ICD-10-CM | POA: Diagnosis present

## 2019-05-17 NOTE — Patient Instructions (Addendum)
Increased exercise: Start riding bike every day for 10 min 2x/day Diet: Consider changing up your snacks so they are more balanced and have more protein, use handout for ideas. Consider having more meals cooked at home. When getting food at [place], choose baked or grilled fish and only get one or the other of fries or hush puppies, but not both. Consider having whole fruit instead of juice.  Remember that stress can raise blood sugar.  You can check your blood sugar 2 hours after a meal if you want to see how it affected your blood sugar.

## 2019-05-17 NOTE — Progress Notes (Signed)
Diabetes Self-Management Education  Visit Type: First/Initial  Appt. Start Time: 0830 Appt. End Time: 0930  05/20/2019  [name], identified by name and date of birth, is a 69 y.o. male with a diagnosis of Diabetes: Type 2.   ASSESSMENT  There were no vitals taken for this visit. There is no height or weight on file to calculate BMI.  A1c is up to 8.1% from 7.4% 1 year ago. Medication: [name] Rx is 40 units, patient taking 55 units as following MD instructions to titrate this was the amount that has brought his FBS closer to the goal of 130 mg/dL. Pt also taking metformin 1000 mg bid, Farxiga 10 mg.  Pt states his FBS: 98-170 mg/dL. Pt states he has found a pattern that if he is done eating by 7 pm it is lower. Pt states sometimes he is confused why his glucose numbers are not predictable.  Patient works for [employer] and is active at work. Pt states the other physical activity includes walking dog 15 min per day at leisurely pace.   Pt reports his wife has had back and knee surgeries, not able to cook as much.   Diabetes Self-Management Education - 05/17/19 0836      Visit Information   Visit Type  First/Initial      Initial Visit   Diabetes Type  Type 2    Are you currently following a meal plan?  No    Date Diagnosed  5 years ago      Health Coping   How would you rate your overall health?  Fair      Psychosocial Assessment   Patient Belief/Attitude about Diabetes  Motivated to manage diabetes    How often do you need to have someone help you when you read instructions, pamphlets, or other written materials from your doctor or pharmacy?  1 - Never    What is the last grade level you completed in school?  2 yrs community college      Complications   Last HgB A1C per patient/outside source  8.1 %    How often do you check your blood sugar?  1-2 times/day    Fasting Blood glucose range (mg/dL)  --   98-170   Have you had a dilated eye exam in  the past 12 months?  Yes    Have you had a dental exam in the past 12 months?  Yes    Are you checking your feet?  Yes    How many days per week are you checking your feet?  7      Dietary Intake   Breakfast  coffee, sometimes on weekends.    Snack (morning)  protein bar OR fruit    Snack (afternoon)  pickles & potato chips 3 or more days per week    Dinner  burger, fries  OR [place], fish, baked potato, hushpuppies OR pizza 2x week, salald, low fat ranch or vinegarette    Snack (evening)  fruit or OJ    Beverage(s)  diet coke, tea with less sugar, coffee all day      Exercise   Exercise Type  Light (walking / raking leaves)    How many days per week to you exercise?  7    How many minutes per day do you exercise?  15    Total minutes per week of exercise  105      Patient Education   Previous Diabetes Education  Yes (please comment)   [facility]   Disease state   Definition of diabetes, type 1 and 2, and the diagnosis of diabetes    Nutrition management   Role of diet in the treatment of diabetes and the relationship between the three main macronutrients and blood glucose level    Physical activity and exercise   Role of exercise on diabetes management, blood pressure control and cardiac health.    Monitoring  Identified appropriate SMBG and/or A1C goals.    Psychosocial adjustment  Role of stress on diabetes      Individualized Goals (developed by patient)   Nutrition  General guidelines for healthy choices and portions discussed    Physical Activity  Exercise 3-5 times per week    Monitoring   test my blood glucose as discussed      Outcomes   Expected Outcomes  Demonstrated interest in learning. Expect positive outcomes    Future DMSE  4-6 wks    Program Status  Not Completed       Individualized Plan for Diabetes Self-Management Training:   Learning Objective:  Patient will have a greater understanding of diabetes self-management. Patient education  plan is to attend individual and/or group sessions per assessed needs and concerns.    Patient Instructions  Increased exercise: Start riding bike every day for 10 min 2x/day Diet: Consider changing up your snacks so they are more balanced and have more protein, use handout for ideas. Consider having more meals cooked at home. When getting food at [place], choose baked or grilled fish and only get one or the other of fries or hush puppies, but not both. Consider having whole fruit instead of juice.  Remember that stress can raise blood sugar.  You can check your blood sugar 2 hours after a meal if you want to see how it affected your blood sugar.   Expected Outcomes:  Demonstrated interest in learning. Expect positive outcomes  Education material provided: ADA - How to Thrive: A Guide for Your Journey with Diabetes  If problems or questions, patient to contact team via:  Phone and MyChart  Future DSME appointment: 4-6 wks

## 2019-06-05 ENCOUNTER — Ambulatory Visit: Payer: BC Managed Care – PPO | Admitting: Internal Medicine

## 2019-06-19 ENCOUNTER — Other Ambulatory Visit: Payer: Self-pay

## 2019-06-19 ENCOUNTER — Encounter: Payer: Self-pay | Admitting: Internal Medicine

## 2019-06-19 ENCOUNTER — Telehealth: Payer: Self-pay

## 2019-06-19 ENCOUNTER — Ambulatory Visit: Payer: BC Managed Care – PPO | Admitting: Internal Medicine

## 2019-06-19 VITALS — BP 110/70 | HR 72 | Temp 97.5°F | Ht 69.0 in | Wt 226.0 lb

## 2019-06-19 DIAGNOSIS — I700 Atherosclerosis of aorta: Secondary | ICD-10-CM | POA: Diagnosis not present

## 2019-06-19 DIAGNOSIS — E1140 Type 2 diabetes mellitus with diabetic neuropathy, unspecified: Secondary | ICD-10-CM | POA: Diagnosis not present

## 2019-06-19 DIAGNOSIS — E1165 Type 2 diabetes mellitus with hyperglycemia: Secondary | ICD-10-CM | POA: Diagnosis not present

## 2019-06-19 DIAGNOSIS — I10 Essential (primary) hypertension: Secondary | ICD-10-CM

## 2019-06-19 DIAGNOSIS — IMO0002 Reserved for concepts with insufficient information to code with codable children: Secondary | ICD-10-CM

## 2019-06-19 LAB — POCT GLYCOSYLATED HEMOGLOBIN (HGB A1C): Hemoglobin A1C: 7.3 % — AB (ref 4.0–5.6)

## 2019-06-19 MED ORDER — TRESIBA FLEXTOUCH 100 UNIT/ML ~~LOC~~ SOPN: 50.0000 [IU] | PEN_INJECTOR | Freq: Every day | SUBCUTANEOUS | 0 refills | Status: DC

## 2019-06-19 NOTE — Assessment & Plan Note (Signed)
Noted on CT scan On ASA and statin

## 2019-06-19 NOTE — Assessment & Plan Note (Signed)
Lab Results  Component Value Date   HGBA1C 7.3 (A) 06/19/2019   Much better with changes in diet Able to cut back on insulin Mild neuropathy only

## 2019-06-19 NOTE — Telephone Encounter (Signed)
Pt wanted to know from [facility]; pts wife has questions about today's visit and request cb from [place]. Pt did not want to go into more detail but requested cb from [place].

## 2019-06-19 NOTE — Patient Instructions (Addendum)
Please pack the place on your left leg with gauze soaked in saline (can buy a bottle over the counter)

## 2019-06-19 NOTE — Assessment & Plan Note (Signed)
BP Readings from Last 3 Encounters:  06/19/19 110/70  12/05/18 118/70  09/05/18 114/64   Good control on lisinopril

## 2019-06-19 NOTE — Progress Notes (Signed)
Subjective:    Patient ID: Aaron Zimmerman, male    DOB: [birth year], 69 y.o.   MRN: [id number]  HPI Here for follow up of diabetes--and other chronic health conditions This visit occurred during the SARS-CoV-2 public health emergency.  Safety protocols were in place, including screening questions prior to the visit, additional usage of staff PPE, and extensive cleaning of exam room while observing appropriate contact time as indicated for disinfecting solutions.   Went to 1 session of diabetic counseling in person Weekly podcast since then Has adjusted diet--avoiding the night snacking No hypoglycemic reactions of note (once at work had mild symptoms) Insulin up to 55----now back down to 50  No chest pain or SOB No dizziness or syncope No edema  Known aortic atherosclerosis on CT of abdomen Also calcium in coronary arteries as well  Current Outpatient Medications on File Prior to Visit  Medication Sig Dispense Refill  . aspirin 81 MG EC tablet Take 1 tablet (81 mg total) by mouth daily. Swallow whole. 90 tablet 4  . cetirizine (ZYRTEC) 10 MG tablet Take 1 tablet (10 mg total) by mouth daily as needed for allergies. 90 tablet 3  . Cyanocobalamin (B-12 PO) Take 1 tablet by mouth daily.     Marland Kitchen FARXIGA 10 MG TABS tablet TAKE 1 TABLET BY MOUTH ONCE DAILY 90 tablet 3  . Insulin Pen Needle (PEN NEEDLES) 32G X 6 MM MISC 1 each by Does not apply route daily. 100 each 3  . lisinopril (ZESTRIL) 10 MG tablet TAKE 1 TABLET BY MOUTH ONCE DAILY 90 tablet 3  . metFORMIN (GLUCOPHAGE) 1000 MG tablet TAKE 1 TABLET BY MOUTH TWICE A DAY WITH MEALS 60 tablet 11  . sildenafil (REVATIO) 20 MG tablet Take 3 tablets (60 mg total) by mouth daily as needed. Prn 50 tablet 11  . simvastatin (ZOCOR) 20 MG tablet TAKE ONE TABLET BY MOUTH EVERY NIGHT AT BEDTIME 90 tablet 3  . TRESIBA FLEXTOUCH 100 UNIT/ML SOPN FlexTouch Pen INJECT 40 UNITS INTO THE SKIN ONCE DAILY 15 mL 11   No current facility-administered  medications on file prior to visit.    Allergies  Allergen Reactions  . Pioglitazone Rash    Past Medical History:  Diagnosis Date  . Allergy   . Cataract    left eye  . Diabetes mellitus ~2006  . Hyperlipidemia   . Hypertension    take blood pressure medication preventive percaution  . Personal history of urinary calculi   . Vertigo   . Vitamin B12 deficiency     Past Surgical History:  Procedure Laterality Date  . COLONOSCOPY    . POLYPECTOMY    . traumatic injury and repair of RT hand  1974    Family History  Problem Relation Age of Onset  . Heart disease Mother        CAD/PCI with stents; cardiomyopathy with AICD  . Cancer Other        Colon and Prostate  . Colon cancer Neg Hx   . Colon polyps Neg Hx   . Esophageal cancer Neg Hx   . Rectal cancer Neg Hx   . Stomach cancer Neg Hx     Social History   Socioeconomic History  . Marital status: Married    Spouse name: Not on file  . Number of children: 2  . Years of education: Not on file  . Highest education level: Not on file  Occupational History  . Occupation: Maintenance-- temperature controls  Employer: [employer]  Tobacco Use  . Smoking status: Never Smoker  . Smokeless tobacco: Former Systems developer    Types: Chew  Substance and Sexual Activity  . Alcohol use: No  . Drug use: No  . Sexual activity: Yes  Other Topics Concern  . Not on file  Social History Narrative   HSG   Married 1979   1 son 8 1 daughter 47; 1 granddaughter Eliezer Lofts '07), 1 grandson (Seth '09)   Work: [employer]   Regular exercise: walking when he can   Caffeine use: coffee all day   Social Determinants of Radio broadcast assistant Strain:   . Difficulty of Paying Living Expenses:   Food Insecurity:   . Worried About Charity fundraiser in the Last Year:   . Arboriculturist in the Last Year:   Transportation Needs:   . Film/video editor (Medical):   Marland Kitchen Lack of Transportation  (Non-Medical):   Physical Activity:   . Days of Exercise per Week:   . Minutes of Exercise per Session:   Stress:   . Feeling of Stress :   Social Connections:   . Frequency of Communication with Friends and Family:   . Frequency of Social Gatherings with Friends and Family:   . Attends Religious Services:   . Active Member of Clubs or Organizations:   . Attends Archivist Meetings:   Marland Kitchen Marital Status:   Intimate Partner Violence:   . Fear of Current or Ex-Partner:   . Emotionally Abused:   Marland Kitchen Physically Abused:   . Sexually Abused:    Review of Systems Weight is stable Still not sure about COVID vaccine--urged him to get it Has spot on leg--dermatologist removed 1 week ago. Wants it checked    Objective:   Physical Exam  Constitutional: He appears well-developed. No distress.  Neck: No thyromegaly present.  Cardiovascular: Normal rate, regular rhythm, normal heart sounds and intact distal pulses. Exam reveals no gallop.  No murmur heard. Respiratory: Breath sounds normal. No respiratory distress. He has no wheezes. He has no rales.  Musculoskeletal:        General: No tenderness or edema.  Lymphadenopathy:    He has no cervical adenopathy.  Skin:  Deep excision site on left thigh--not infected No foot lesions  Psychiatric: He has a normal mood and affect. His behavior is normal.           Assessment & Plan:

## 2019-06-20 NOTE — Telephone Encounter (Signed)
Left message to call office. Advised him I may be at lunch between 1 and 2 today.

## 2019-06-27 NOTE — Telephone Encounter (Signed)
Spoke to pt. He said his wife spoke to someone earlier in the week and got all her questions answered.

## 2019-08-07 ENCOUNTER — Other Ambulatory Visit: Payer: Self-pay | Admitting: Internal Medicine

## 2019-08-09 ENCOUNTER — Other Ambulatory Visit: Payer: Self-pay | Admitting: Internal Medicine

## 2019-08-15 ENCOUNTER — Telehealth: Payer: Self-pay

## 2019-08-15 NOTE — Telephone Encounter (Signed)
Pt called triage line asking what Covid vaccine we had or recommended.  I called pt back and advised him we do not have them here and we do not recommend one over the other. We just recommend they get it. He stated he was still a bit skeptical about getting it but was thinking about J&J. I advised him that is a great option for 1 and done. Just as long as he gets one since he is diabetic. He will talk to [facility].

## 2019-11-11 ENCOUNTER — Telehealth: Payer: Self-pay | Admitting: Internal Medicine

## 2019-11-11 NOTE — Telephone Encounter (Signed)
Spoke to pt and his wife. It was done by the insurance company. She has sent them his results from last year. I told her to wait and see what they say before doing anything else.

## 2019-11-11 NOTE — Telephone Encounter (Signed)
Called and spoke to pt. They did labs and his wife told him a number but did not provide him with the range. He was going to get the range and call me back.

## 2019-11-11 NOTE — Telephone Encounter (Signed)
The most recent PSA is 3.41--which is normal. I do not see any abnormal PSA measurements in his records

## 2019-11-11 NOTE — Telephone Encounter (Signed)
Was that test done by the insurance company? I don't see that result in his chart. Those numbers are borderline abnormal---and I would just recheck it in a few months ----but the insurance company probably won't want to give life insurance with the elevated PSA

## 2019-11-11 NOTE — Telephone Encounter (Signed)
Wife called back. Range for PSA is 0-4 his was 4.09. Free PSA range <25 is good. His was 9.

## 2019-11-11 NOTE — Telephone Encounter (Signed)
Pt's wife called and says he has applied life insurance and they are questioning the PSA results.  Pt is now also concerned.  He has seen the results on his MyChart but doesn't understand them.  Could you please call him to help him better understand the results so he can explain to [employer] as well.  Thank you!

## 2019-11-25 ENCOUNTER — Ambulatory Visit: Payer: BC Managed Care – PPO | Admitting: Internal Medicine

## 2019-12-05 LAB — HM DIABETES EYE EXAM

## 2019-12-10 ENCOUNTER — Encounter: Payer: Self-pay | Admitting: Internal Medicine

## 2019-12-10 ENCOUNTER — Ambulatory Visit (INDEPENDENT_AMBULATORY_CARE_PROVIDER_SITE_OTHER): Payer: BC Managed Care – PPO | Admitting: Internal Medicine

## 2019-12-10 ENCOUNTER — Other Ambulatory Visit: Payer: Self-pay

## 2019-12-10 VITALS — BP 108/70 | HR 70 | Temp 96.9°F | Ht 68.75 in | Wt 227.0 lb

## 2019-12-10 DIAGNOSIS — Z23 Encounter for immunization: Secondary | ICD-10-CM

## 2019-12-10 DIAGNOSIS — E1159 Type 2 diabetes mellitus with other circulatory complications: Secondary | ICD-10-CM | POA: Diagnosis not present

## 2019-12-10 DIAGNOSIS — Z0000 Encounter for general adult medical examination without abnormal findings: Secondary | ICD-10-CM

## 2019-12-10 DIAGNOSIS — E538 Deficiency of other specified B group vitamins: Secondary | ICD-10-CM | POA: Diagnosis not present

## 2019-12-10 DIAGNOSIS — I10 Essential (primary) hypertension: Secondary | ICD-10-CM

## 2019-12-10 LAB — CBC
HCT: 47.4 % (ref 39.0–52.0)
Hemoglobin: 15.9 g/dL (ref 13.0–17.0)
MCHC: 33.6 g/dL (ref 30.0–36.0)
MCV: 84.8 fl (ref 78.0–100.0)
Platelets: 205 10*3/uL (ref 150.0–400.0)
RBC: 5.59 Mil/uL (ref 4.22–5.81)
RDW: 13.2 % (ref 11.5–15.5)
WBC: 6 10*3/uL (ref 4.0–10.5)

## 2019-12-10 LAB — COMPREHENSIVE METABOLIC PANEL
ALT: 26 U/L (ref 0–53)
AST: 23 U/L (ref 0–37)
Albumin: 4.6 g/dL (ref 3.5–5.2)
Alkaline Phosphatase: 45 U/L (ref 39–117)
BUN: 17 mg/dL (ref 6–23)
CO2: 29 mEq/L (ref 19–32)
Calcium: 9.5 mg/dL (ref 8.4–10.5)
Chloride: 102 mEq/L (ref 96–112)
Creatinine, Ser: 0.94 mg/dL (ref 0.40–1.50)
GFR: 84.14 mL/min (ref >60.00)
Glucose, Bld: 89 mg/dL (ref 70–99)
Potassium: 4 mEq/L (ref 3.5–5.1)
Sodium: 139 mEq/L (ref 135–145)
Total Bilirubin: 0.7 mg/dL (ref 0.2–1.2)
Total Protein: 7.3 g/dL (ref 6.0–8.3)

## 2019-12-10 LAB — VITAMIN B12: Vitamin B-12: 253 pg/mL (ref 211–911)

## 2019-12-10 LAB — LIPID PANEL
Cholesterol: 115 mg/dL (ref 0–200)
HDL: 38.3 mg/dL — ABNORMAL LOW (ref >39.00)
LDL Cholesterol: 61 mg/dL (ref 0–99)
NonHDL: 76.27
Total CHOL/HDL Ratio: 3
Triglycerides: 77 mg/dL (ref 0.0–149.0)
VLDL: 15.4 mg/dL (ref 0.0–40.0)

## 2019-12-10 LAB — HEMOGLOBIN A1C: Hgb A1c MFr Bld: 7.9 % — ABNORMAL HIGH (ref 4.6–6.5)

## 2019-12-10 LAB — HM DIABETES FOOT EXAM

## 2019-12-10 NOTE — Progress Notes (Signed)
Subjective:    Patient ID: Aaron Zimmerman, male    DOB: January 02, 1951, 69 y.o.   MRN: [id number]  HPI  Here for physical This visit occurred during the SARS-CoV-2 public health emergency.  Safety protocols were in place, including screening questions prior to the visit, additional usage of staff PPE, and extensive cleaning of exam room while observing appropriate contact time as indicated for disinfecting solutions.   Retiring January 1st No clear plans yet---lots of chores, etc to be done  Had PE for [employer] PSA was 4.09---then went to urologist Not that excited---will just recheck in 6 months  Checking sugars daily  Usually 130's---rarely up to 180 (if he eats late) No hypoglycemic reactions--can "feel funny" if down under 80 (not recently) Very little numbness in feet now. No pain  Current Outpatient Medications on File Prior to Visit  Medication Sig Dispense Refill  . aspirin 81 MG EC tablet Take 1 tablet (81 mg total) by mouth daily. Swallow whole. 90 tablet 4  . cetirizine (ZYRTEC) 10 MG tablet Take 1 tablet (10 mg total) by mouth daily as needed for allergies. 90 tablet 3  . Cyanocobalamin (B-12 PO) Take 1 tablet by mouth daily.     Marland Kitchen FARXIGA 10 MG TABS tablet TAKE 1 TABLET BY MOUTH ONCE DAILY 90 tablet 3  . insulin degludec (TRESIBA FLEXTOUCH) 100 UNIT/ML FlexTouch Pen Inject 0.5 mLs (50 Units total) into the skin daily. 1 mL 0  . Insulin Pen Needle (PEN NEEDLES) 32G X 6 MM MISC 1 each by Does not apply route daily. 100 each 3  . lisinopril (ZESTRIL) 10 MG tablet TAKE 1 TABLET BY MOUTH ONCE DAILY 90 tablet 3  . metFORMIN (GLUCOPHAGE) 1000 MG tablet TAKE 1 TABLET BY MOUTH TWICE A DAY WITH MEALS 180 tablet 3  . sildenafil (REVATIO) 20 MG tablet TAKE 3 TABLES (60 MG TOTAL) BY MOUTH DAILY AS NEEDED. 50 tablet 11  . simvastatin (ZOCOR) 20 MG tablet TAKE ONE TABLET BY MOUTH EVERY NIGHT AT BEDTIME 90 tablet 3   No current facility-administered medications on file prior to  visit.    Allergies  Allergen Reactions  . Pioglitazone Rash    Past Medical History:  Diagnosis Date  . Allergy   . Cataract    left eye  . Diabetes mellitus ~2006  . Hyperlipidemia   . Hypertension    take blood pressure medication preventive percaution  . Personal history of urinary calculi   . Vertigo   . Vitamin B12 deficiency     Past Surgical History:  Procedure Laterality Date  . COLONOSCOPY    . POLYPECTOMY    . traumatic injury and repair of RT hand  1974    Family History  Problem Relation Age of Onset  . Heart disease Mother        CAD/PCI with stents; cardiomyopathy with AICD  . Cancer Other        Colon and Prostate  . Colon cancer Neg Hx   . Colon polyps Neg Hx   . Esophageal cancer Neg Hx   . Rectal cancer Neg Hx   . Stomach cancer Neg Hx     Social History   Socioeconomic History  . Marital status: Married    Spouse name: Not on file  . Number of children: 2  . Years of education: Not on file  . Highest education level: Not on file  Occupational History  . Occupation: Maintenance-- temperature controls  Employer: [employer]  Tobacco Use  . Smoking status: Never Smoker  . Smokeless tobacco: Former Systems developer    Types: Secondary school teacher  . Vaping Use: Never used  Substance and Sexual Activity  . Alcohol use: No  . Drug use: No  . Sexual activity: Yes  Other Topics Concern  . Not on file  Social History Narrative   HSG   Married 1979   1 son 21 1 daughter 52; 1 granddaughter Eliezer Lofts '07), 1 grandson (Seth '09)   Work: [employer]   Regular exercise: walking when he can   Caffeine use: coffee all day   Social Determinants of Radio broadcast assistant Strain:   . Difficulty of Paying Living Expenses: Not on file  Food Insecurity:   . Worried About Charity fundraiser in the Last Year: Not on file  . Ran Out of Food in the Last Year: Not on file  Transportation Needs:   . Lack of Transportation  (Medical): Not on file  . Lack of Transportation (Non-Medical): Not on file  Physical Activity:   . Days of Exercise per Week: Not on file  . Minutes of Exercise per Session: Not on file  Stress:   . Feeling of Stress : Not on file  Social Connections:   . Frequency of Communication with Friends and Family: Not on file  . Frequency of Social Gatherings with Friends and Family: Not on file  . Attends Religious Services: Not on file  . Active Member of Clubs or Organizations: Not on file  . Attends Archivist Meetings: Not on file  . Marital Status: Not on file  Intimate Partner Violence:   . Fear of Current or Ex-Partner: Not on file  . Emotionally Abused: Not on file  . Physically Abused: Not on file  . Sexually Abused: Not on file   Review of Systems  Constitutional: Negative for fatigue and unexpected weight change.       Wears seat belt  HENT: Positive for tinnitus.        Some hearing loss Keeps up with dentist--may need partial in time  Eyes: Negative for visual disturbance.       No diplopia or unilateral vision loss  Respiratory: Negative for cough, chest tightness and shortness of breath.   Cardiovascular: Negative for chest pain, palpitations and leg swelling.  Gastrointestinal: Negative for blood in stool and constipation.       Heartburn only with garlic Some dysphagia if he eats too quick  Endocrine: Negative for polydipsia and polyuria.  Genitourinary: Negative for difficulty urinating and urgency.       Nocturia x 1 Flow okay Satisfied with sildenafil  Musculoskeletal: Negative for arthralgias, back pain and joint swelling.  Skin: Negative for rash.  Allergic/Immunologic: Positive for environmental allergies. Negative for immunocompromised state.       Rarely uses zyrtec  Neurological: Negative for dizziness, syncope, light-headedness and headaches.  Hematological: Negative for adenopathy. Does not bruise/bleed easily.  Psychiatric/Behavioral:  Negative for dysphoric mood and sleep disturbance. The patient is not nervous/anxious.        Objective:   Physical Exam Constitutional:      Appearance: Normal appearance.  HENT:     Right Ear: Tympanic membrane, ear canal and external ear normal.     Left Ear: Tympanic membrane, ear canal and external ear normal.     Mouth/Throat:     Pharynx: No oropharyngeal exudate or posterior oropharyngeal erythema.  Eyes:     Conjunctiva/sclera: Conjunctivae normal.     Pupils: Pupils are equal, round, and reactive to light.  Cardiovascular:     Rate and Rhythm: Normal rate and regular rhythm.     Pulses: Normal pulses.     Heart sounds: No murmur heard.  No gallop.   Pulmonary:     Effort: Pulmonary effort is normal.     Breath sounds: Normal breath sounds. No wheezing or rales.  Abdominal:     Palpations: Abdomen is soft.     Tenderness: There is no abdominal tenderness.  Musculoskeletal:     Cervical back: Neck supple.     Right lower leg: No edema.     Left lower leg: No edema.  Lymphadenopathy:     Cervical: No cervical adenopathy.  Skin:    General: Skin is warm.     Findings: No rash.     Comments: No foot lesions  Neurological:     General: No focal deficit present.     Mental Status: He is alert and oriented to person, place, and time.     Comments: Fairly normal sensation in feet  Psychiatric:        Mood and Affect: Mood normal.        Behavior: Behavior normal.            Assessment & Plan:

## 2019-12-10 NOTE — Assessment & Plan Note (Signed)
BP Readings from Last 3 Encounters:  12/10/19 108/70  06/19/19 110/70  12/05/18 118/70   Good control on lisinopril

## 2019-12-10 NOTE — Assessment & Plan Note (Addendum)
Seems to still have good control Insulin, metformin, farxiga Rx for HTN Minimal neuropathy at this point Is on statin

## 2019-12-10 NOTE — Assessment & Plan Note (Signed)
Healthy Discussed fitness Repeat colon due 2027 Just had PSA and will follow up at urologist Flu vaccine today COVID booster in the spring Pneumovax in 2023

## 2019-12-10 NOTE — Assessment & Plan Note (Signed)
On supplement 

## 2020-01-09 ENCOUNTER — Other Ambulatory Visit: Payer: Self-pay | Admitting: Internal Medicine

## 2020-01-24 ENCOUNTER — Other Ambulatory Visit: Payer: Self-pay | Admitting: Internal Medicine

## 2020-03-13 ENCOUNTER — Telehealth: Payer: Self-pay | Admitting: Internal Medicine

## 2020-03-13 NOTE — Telephone Encounter (Signed)
Aaron Zimmerman called in from [facility] wanted to know if they can get a new script with the dosage but only for 30 day for insurance reason, and she stated if you put in the sign box put 1box-30 day send in a fill for 3 boxes.

## 2020-03-16 NOTE — Telephone Encounter (Signed)
Aaron Zimmerman with [facility] left a voicemail stating that she called last week about [place]. Aaron Zimmerman requested that Dr. Alla German assistant call her back to discuss some issues with this.

## 2020-03-16 NOTE — Telephone Encounter (Signed)
I am not sure what medication they are calling about. I would guess it is the [place]. Our rx has it as ml and not box. I tried to call the pharmacy to clarify but the line was busy.

## 2020-03-17 MED ORDER — TRESIBA FLEXTOUCH 100 UNIT/ML ~~LOC~~ SOPN: 50.0000 [IU] | PEN_INJECTOR | Freq: Every day | SUBCUTANEOUS | 3 refills | Status: DC

## 2020-03-17 NOTE — Telephone Encounter (Signed)
Spoke to [place] [facility]. Corrected the rx so it would go through for him.

## 2020-03-30 ENCOUNTER — Other Ambulatory Visit: Payer: Self-pay | Admitting: Internal Medicine

## 2020-03-30 NOTE — Telephone Encounter (Signed)
Aaron Zimmerman with [name] left v/m requesting cb from nurse she had been talking to about [place]. Aaron Zimmerman needs new rx since dose was increased to 50 units daily. Order class from refill on 03/17/20 med list has fill later.Please advise.

## 2020-03-31 MED ORDER — TRESIBA FLEXTOUCH 100 UNIT/ML ~~LOC~~ SOPN
50.0000 [IU] | PEN_INJECTOR | Freq: Every day | SUBCUTANEOUS | 3 refills | Status: DC
Start: 2020-03-31 — End: 2021-06-21

## 2020-03-31 NOTE — Addendum Note (Signed)
Addended by: Pilar Grammes on: 03/31/2020 07:50 AM   Modules accepted: Orders

## 2020-03-31 NOTE — Telephone Encounter (Signed)
I sent in what Caryl Pina told me to send in on 03-17-20.

## 2020-04-22 ENCOUNTER — Other Ambulatory Visit: Payer: Self-pay | Admitting: Internal Medicine

## 2020-04-22 ENCOUNTER — Other Ambulatory Visit: Payer: Self-pay

## 2020-04-22 ENCOUNTER — Ambulatory Visit: Payer: Medicare PPO | Admitting: Internal Medicine

## 2020-04-22 ENCOUNTER — Encounter: Payer: Self-pay | Admitting: Internal Medicine

## 2020-04-22 DIAGNOSIS — R6883 Chills (without fever): Secondary | ICD-10-CM | POA: Insufficient documentation

## 2020-04-22 DIAGNOSIS — M170 Bilateral primary osteoarthritis of knee: Secondary | ICD-10-CM | POA: Diagnosis not present

## 2020-04-22 NOTE — Assessment & Plan Note (Signed)
Has occasional spells Not ill or sick Sounds like it may be manifestation of diabetic neuropathy Asked him to check sugar during spell Consider adjusting insulin if any lows

## 2020-04-22 NOTE — Patient Instructions (Signed)
Check your sugar and see if there are any obvious factors if you have another cold spell.  You can try tylenol for your knees---or use over the counter diclofenac gel on them as needed

## 2020-04-22 NOTE — Progress Notes (Signed)
Subjective:    Patient ID: Aaron Zimmerman, male    DOB: 04/30/1950, 70 y.o.   MRN: [id number]  HPI Here with several concerns This visit occurred during the SARS-CoV-2 public health emergency.  Safety protocols were in place, including screening questions prior to the visit, additional usage of staff PPE, and extensive cleaning of exam room while observing appropriate contact time as indicated for disinfecting solutions.   About once a week--- 6-8PM--will feel hot, then cold No fever Has to wrap in blanket Will pass in a couple of hours Doesn't feel sick---no N/V May have less energy the next day No clear precipitating factors Tylenol did help  Sugars have been okay Usually 110-140 fasting Hasn't checked sugars during the spells  No dizziness  No chest pain or SOB  Having some knee pain lately Relates to tough work over the years--ladders, etc Some constant ache at times  Current Outpatient Medications on File Prior to Visit  Medication Sig Dispense Refill  . cetirizine (ZYRTEC) 10 MG tablet Take 1 tablet (10 mg total) by mouth daily as needed for allergies. 90 tablet 3  . Cyanocobalamin (B-12 PO) Take 1 tablet by mouth daily.     Marland Kitchen FARXIGA 10 MG TABS tablet TAKE 1 TABLET BY MOUTH ONCE DAILY 90 tablet 3  . GNP ASPIRIN LOW DOSE 81 MG EC tablet TAKE 1 TABLET BY MOUTH DAILY SWALLOW WHOLE 90 tablet 3  . insulin degludec (TRESIBA FLEXTOUCH) 100 UNIT/ML FlexTouch Pen Inject 50 Units into the skin daily. 45 mL 3  . Insulin Pen Needle (PEN NEEDLES) 32G X 6 MM MISC 1 each by Does not apply route daily. 100 each 3  . lisinopril (ZESTRIL) 10 MG tablet TAKE 1 TABLET BY MOUTH ONCE A DAY 90 tablet 3  . metFORMIN (GLUCOPHAGE) 1000 MG tablet TAKE 1 TABLET BY MOUTH TWICE A DAY WITH MEALS 180 tablet 3  . sildenafil (REVATIO) 20 MG tablet TAKE 3 TABLES (60 MG TOTAL) BY MOUTH DAILY AS NEEDED. 50 tablet 11  . simvastatin (ZOCOR) 20 MG tablet TAKE ONE TABLET BY MOUTH EVERY NIGHT AT BEDTIME 90  tablet 3   No current facility-administered medications on file prior to visit.    Allergies  Allergen Reactions  . Pioglitazone Rash    Past Medical History:  Diagnosis Date  . Allergy   . Cataract    left eye  . Diabetes mellitus ~2006  . Hyperlipidemia   . Hypertension    take blood pressure medication preventive percaution  . Personal history of urinary calculi   . Vertigo   . Vitamin B12 deficiency     Past Surgical History:  Procedure Laterality Date  . COLONOSCOPY    . POLYPECTOMY    . traumatic injury and repair of RT hand  1974    Family History  Problem Relation Age of Onset  . Heart disease Mother        CAD/PCI with stents; cardiomyopathy with AICD  . Cancer Other        Colon and Prostate  . Colon cancer Neg Hx   . Colon polyps Neg Hx   . Esophageal cancer Neg Hx   . Rectal cancer Neg Hx   . Stomach cancer Neg Hx     Social History   Socioeconomic History  . Marital status: Married    Spouse name: Not on file  . Number of children: 2  . Years of education: Not on file  . Highest education level:  Not on file  Occupational History  . Occupation: Maintenance-- temperature controls    Employer: [employer]  Tobacco Use  . Smoking status: Never Smoker  . Smokeless tobacco: Former Systems developer    Types: Secondary school teacher  . Vaping Use: Never used  Substance and Sexual Activity  . Alcohol use: No  . Drug use: No  . Sexual activity: Yes  Other Topics Concern  . Not on file  Social History Narrative   HSG   Married 1979   1 son 38 1 daughter 70; 1 granddaughter Eliezer Lofts '07), 1 grandson (Seth '09)   Work: [employer]   Regular exercise: walking when he can   Caffeine use: coffee all day   Social Determinants of Radio broadcast assistant Strain: Not on [employer] Insecurity: Not on file  Transportation Needs: Not on file  Physical Activity: Not on file  Stress: Not on file  Social Connections: Not on file   Intimate Partner Violence: Not on file   Review of Systems Has dry mouth at times---drinks water a lot Voice goes raspy---better if he drinks something Rarely takes cetirizine    Objective:   Physical Exam Constitutional:      Appearance: Normal appearance.  HENT:     Mouth/Throat:     Mouth: Mucous membranes are moist.  Cardiovascular:     Rate and Rhythm: Normal rate and regular rhythm.     Heart sounds: No murmur heard. No gallop.   Pulmonary:     Effort: Pulmonary effort is normal.     Breath sounds: Normal breath sounds. No wheezing or rales.  Musculoskeletal:     Cervical back: Neck supple.     Right lower leg: No edema.     Left lower leg: No edema.     Comments: No knee effusions No meniscus or ligament findings ROM normal  Lymphadenopathy:     Cervical: No cervical adenopathy.  Neurological:     Mental Status: He is alert.            Assessment & Plan:

## 2020-04-22 NOTE — Assessment & Plan Note (Signed)
Early findings Discussed tylenol, topical diclofenac

## 2020-05-05 ENCOUNTER — Telehealth: Payer: Self-pay

## 2020-05-05 NOTE — Telephone Encounter (Signed)
I looked back in his med list and found the most recent thing he has taken is Zyrtec. Left detailed message on VM that is what he has taken most recent. Also suggested a spray like Flonase or Nasacort if he needed additional allergy help.

## 2020-05-05 NOTE — Telephone Encounter (Signed)
Pt left v/m that pt has had allergies in the past and pt cannot remember the name of the med pt was given for allergies. Pt request cb from [facility].

## 2020-06-09 ENCOUNTER — Other Ambulatory Visit: Payer: Self-pay

## 2020-06-09 ENCOUNTER — Ambulatory Visit (INDEPENDENT_AMBULATORY_CARE_PROVIDER_SITE_OTHER): Payer: Medicare PPO | Admitting: Internal Medicine

## 2020-06-09 ENCOUNTER — Encounter: Payer: Self-pay | Admitting: Internal Medicine

## 2020-06-09 VITALS — BP 116/68 | HR 98 | Temp 98.2°F | Ht 68.75 in | Wt 221.0 lb

## 2020-06-09 DIAGNOSIS — I10 Essential (primary) hypertension: Secondary | ICD-10-CM | POA: Diagnosis not present

## 2020-06-09 DIAGNOSIS — E1159 Type 2 diabetes mellitus with other circulatory complications: Secondary | ICD-10-CM | POA: Diagnosis not present

## 2020-06-09 LAB — POCT GLYCOSYLATED HEMOGLOBIN (HGB A1C): Hemoglobin A1C: 7.6 % — AB (ref 4.0–5.6)

## 2020-06-09 NOTE — Progress Notes (Signed)
Subjective:    Patient ID: Aaron Zimmerman, male    DOB: 24-Dec-1950, 70 y.o.   MRN: [id number]  HPI Here for follow up of diabetes and other medical conditions This visit occurred during the SARS-CoV-2 public health emergency.  Safety protocols were in place, including screening questions prior to the visit, additional usage of staff PPE, and extensive cleaning of exam room while observing appropriate contact time as indicated for disinfecting solutions.   Has had a couple spells of chills since last visit Sugars 95-105 during No recent spells  Using the diclofenac gel on knees and elbow Does help some  Sugars generally 135-140 on average At times in 80's----does feel that [facility] about [facility] there because wife would want to go (and not sure she can tolerate)  No chest pain No SOB---exercise tolerance is stable No dizziness or syncope No edema  Current Outpatient Medications on File Prior to Visit  Medication Sig Dispense Refill  . cetirizine (ZYRTEC) 10 MG tablet Take 1 tablet (10 mg total) by mouth daily as needed for allergies. 90 tablet 3  . Cyanocobalamin (B-12 PO) Take 1 tablet by mouth daily.     Marland Kitchen FARXIGA 10 MG TABS tablet TAKE 1 TABLET BY MOUTH ONCE DAILY 90 tablet 3  . GNP ASPIRIN LOW DOSE 81 MG EC tablet TAKE 1 TABLET BY MOUTH DAILY SWALLOW WHOLE 90 tablet 3  . insulin degludec (TRESIBA FLEXTOUCH) 100 UNIT/ML FlexTouch Pen Inject 50 Units into the skin daily. 45 mL 3  . Insulin Pen Needle (PEN NEEDLES) 32G X 6 MM MISC 1 each by Does not apply route daily. 100 each 3  . lisinopril (ZESTRIL) 10 MG tablet TAKE 1 TABLET BY MOUTH ONCE A DAY 90 tablet 3  . metFORMIN (GLUCOPHAGE) 1000 MG tablet TAKE 1 TABLET BY MOUTH TWICE A DAY WITH MEALS 180 tablet 3  . sildenafil (REVATIO) 20 MG tablet TAKE 3 TABLES (60 MG TOTAL) BY MOUTH DAILY AS NEEDED. 50 tablet 11  . simvastatin (ZOCOR) 20 MG tablet TAKE ONE TABLET BY MOUTH EVERY NIGHT AT BEDTIME  90 tablet 3   No current facility-administered medications on file prior to visit.    Allergies  Allergen Reactions  . Pioglitazone Rash    Past Medical History:  Diagnosis Date  . Allergy   . Cataract    left eye  . Diabetes mellitus ~2006  . Hyperlipidemia   . Hypertension    take blood pressure medication preventive percaution  . Personal history of urinary calculi   . Vertigo   . Vitamin B12 deficiency     Past Surgical History:  Procedure Laterality Date  . COLONOSCOPY    . POLYPECTOMY    . traumatic injury and repair of RT hand  1974    Family History  Problem Relation Age of Onset  . Heart disease Mother        CAD/PCI with stents; cardiomyopathy with AICD  . Cancer Other        Colon and Prostate  . Colon cancer Neg Hx   . Colon polyps Neg Hx   . Esophageal cancer Neg Hx   . Rectal cancer Neg Hx   . Stomach cancer Neg Hx     Social History   Socioeconomic History  . Marital status: Married    Spouse name: Not on file  . Number of children: 2  . Years of education: Not on file  . Highest education level: Not  on file  Occupational History  . Occupation: Maintenance-- temperature controls    Employer: [employer]  Tobacco Use  . Smoking status: Never Smoker  . Smokeless tobacco: Former Systems developer    Types: Secondary school teacher  . Vaping Use: Never used  Substance and Sexual Activity  . Alcohol use: No  . Drug use: No  . Sexual activity: Yes  Other Topics Concern  . Not on file  Social History Narrative   HSG   Married 1979   1 son 64 1 daughter 38; 1 granddaughter Eliezer Lofts '07), 1 grandson (Seth '09)   Work: [employer]   Regular exercise: walking when he can   Caffeine use: coffee all day   Social Determinants of Radio broadcast assistant Strain: Not on [employer] Insecurity: Not on file  Transportation Needs: Not on file  Physical Activity: Not on file  Stress: Not on file  Social Connections: Not on file   Intimate Partner Violence: Not on file   Review of Systems Sleeps well Appetite is good Weight stable    Objective:   Physical Exam Constitutional:      Appearance: Normal appearance.  Cardiovascular:     Rate and Rhythm: Normal rate and regular rhythm.     Pulses: Normal pulses.     Heart sounds: No murmur heard. No gallop.   Pulmonary:     Effort: Pulmonary effort is normal.     Breath sounds: Normal breath sounds. No wheezing or rales.  Musculoskeletal:     Cervical back: Neck supple.     Right lower leg: No edema.     Left lower leg: No edema.  Lymphadenopathy:     Cervical: No cervical adenopathy.  Skin:    Comments: No foot lesions  Neurological:     Mental Status: He is alert.  Psychiatric:        Mood and Affect: Mood normal.        Behavior: Behavior normal.            Assessment & Plan:

## 2020-06-09 NOTE — Assessment & Plan Note (Signed)
Lab Results  Component Value Date   HGBA1C 7.6 (A) 06/09/2020   Control is slightly better and acceptable Will continue farxiga, metformin and tresiba If more significant hypoglycemic reactions, would wean the tresiba

## 2020-06-09 NOTE — Assessment & Plan Note (Signed)
BP Readings from Last 3 Encounters:  06/09/20 116/68  04/22/20 112/70  12/10/19 108/70   Doing well on lisinopril

## 2020-06-10 DIAGNOSIS — R310 Gross hematuria: Secondary | ICD-10-CM | POA: Diagnosis not present

## 2020-06-10 DIAGNOSIS — R972 Elevated prostate specific antigen [PSA]: Secondary | ICD-10-CM | POA: Diagnosis not present

## 2020-06-10 DIAGNOSIS — N400 Enlarged prostate without lower urinary tract symptoms: Secondary | ICD-10-CM | POA: Diagnosis not present

## 2020-06-30 ENCOUNTER — Ambulatory Visit: Payer: Medicare PPO | Admitting: Internal Medicine

## 2020-06-30 ENCOUNTER — Other Ambulatory Visit: Payer: Self-pay

## 2020-06-30 ENCOUNTER — Encounter: Payer: Self-pay | Admitting: Internal Medicine

## 2020-06-30 ENCOUNTER — Telehealth: Payer: Self-pay | Admitting: Internal Medicine

## 2020-06-30 DIAGNOSIS — R509 Fever, unspecified: Secondary | ICD-10-CM | POA: Diagnosis not present

## 2020-06-30 MED ORDER — DOXYCYCLINE HYCLATE 100 MG PO TABS: 100.0000 mg | ORAL_TABLET | Freq: Two times a day (BID) | ORAL | 0 refills | Status: DC

## 2020-06-30 NOTE — Telephone Encounter (Signed)
Appt added on. Spoke to pt's wife and let her know we would add pt on at 12:15 today. Wife states they will be here.

## 2020-06-30 NOTE — Assessment & Plan Note (Signed)
And headache after tick bite Will give doxy for 1 week---asked him to let me know if the fever isn't better within the next couple of days

## 2020-06-30 NOTE — Telephone Encounter (Signed)
Okay to add on at 12:30pm today

## 2020-06-30 NOTE — Telephone Encounter (Signed)
Patient was biting by a tick. Last night the patient was running a low grade fever of 100.1. Patients wife has been giving him Tylenol to lower the fever. Currently, his temperature is 99. He is really fatigue and having headaches. There was streaks coming off the bite last night. Do you want to see them today?

## 2020-06-30 NOTE — Telephone Encounter (Signed)
Patient was seen today. Patients wife called in stating that she just received a call from a friend that was at a wedding that they were at this weekend. Everyone in the wedding party was positive for covid. They went home today and took an at home test and they both are positive for covid. EM

## 2020-06-30 NOTE — Progress Notes (Signed)
Subjective:    Patient ID: Aaron Zimmerman, male    DOB: 06/06/1950, 70 y.o.   MRN: [id number]  HPI Here with fever and headache after tick bite This visit occurred during the SARS-CoV-2 public health emergency.  Safety protocols were in place, including screening questions prior to the visit, additional usage of staff PPE, and extensive cleaning of exam room while observing appropriate contact time as indicated for disinfecting solutions.   Found the tick towards the end of last week Noticed some itching---and he saw legs Might have been on for a few days--but he didn't think it was engorged Removed it 4 days ago--seemed to get it all Had a hard spot right away  Fever, feeling bad yesterday around 6PM Up to 100.2 last night Tylenol helped Mild headache--frontal Slight red streak on arm per wife---he isn't sure about that  Current Outpatient Medications on File Prior to Visit  Medication Sig Dispense Refill  . cetirizine (ZYRTEC) 10 MG tablet Take 1 tablet (10 mg total) by mouth daily as needed for allergies. 90 tablet 3  . Cyanocobalamin (B-12 PO) Take 1 tablet by mouth daily.     Marland Kitchen FARXIGA 10 MG TABS tablet TAKE 1 TABLET BY MOUTH ONCE DAILY 90 tablet 3  . GNP ASPIRIN LOW DOSE 81 MG EC tablet TAKE 1 TABLET BY MOUTH DAILY SWALLOW WHOLE 90 tablet 3  . insulin degludec (TRESIBA FLEXTOUCH) 100 UNIT/ML FlexTouch Pen Inject 50 Units into the skin daily. 45 mL 3  . Insulin Pen Needle (PEN NEEDLES) 32G X 6 MM MISC 1 each by Does not apply route daily. 100 each 3  . lisinopril (ZESTRIL) 10 MG tablet TAKE 1 TABLET BY MOUTH ONCE A DAY 90 tablet 3  . metFORMIN (GLUCOPHAGE) 1000 MG tablet TAKE 1 TABLET BY MOUTH TWICE A DAY WITH MEALS 180 tablet 3  . sildenafil (REVATIO) 20 MG tablet TAKE 3 TABLES (60 MG TOTAL) BY MOUTH DAILY AS NEEDED. 50 tablet 11  . simvastatin (ZOCOR) 20 MG tablet TAKE ONE TABLET BY MOUTH EVERY NIGHT AT BEDTIME 90 tablet 3   No current facility-administered medications  on file prior to visit.    Allergies  Allergen Reactions  . Pioglitazone Rash    Past Medical History:  Diagnosis Date  . Allergy   . Cataract    left eye  . Diabetes mellitus ~2006  . Hyperlipidemia   . Hypertension    take blood pressure medication preventive percaution  . Personal history of urinary calculi   . Vertigo   . Vitamin B12 deficiency     Past Surgical History:  Procedure Laterality Date  . COLONOSCOPY    . POLYPECTOMY    . traumatic injury and repair of RT hand  1974    Family History  Problem Relation Age of Onset  . Heart disease Mother        CAD/PCI with stents; cardiomyopathy with AICD  . Cancer Other        Colon and Prostate  . Colon cancer Neg Hx   . Colon polyps Neg Hx   . Esophageal cancer Neg Hx   . Rectal cancer Neg Hx   . Stomach cancer Neg Hx     Social History   Socioeconomic History  . Marital status: Married    Spouse name: Not on file  . Number of children: 2  . Years of education: Not on file  . Highest education level: Not on file  Occupational History  .  Occupation: Maintenance-- temperature controls    Employer: [employer]  Tobacco Use  . Smoking status: Never Smoker  . Smokeless tobacco: Former Systems developer    Types: Secondary school teacher  . Vaping Use: Never used  Substance and Sexual Activity  . Alcohol use: No  . Drug use: No  . Sexual activity: Yes  Other Topics Concern  . Not on file  Social History Narrative   HSG   Married 1979   1 son 68 1 daughter 34; 1 granddaughter Eliezer Lofts '07), 1 grandson (Seth '09)   Work: [employer]   Regular exercise: walking when he can   Caffeine use: coffee all day   Social Determinants of Radio broadcast assistant Strain: Not on [employer] Insecurity: Not on file  Transportation Needs: Not on file  Physical Activity: Not on file  Stress: Not on file  Social Connections: Not on file  Intimate Partner Violence: Not on file   Review of Systems   No cough or SOB Some throat soreness---feels raspy No N/V Appetite is okay     Objective:   Physical Exam Constitutional:      General: He is not in acute distress. HENT:     Mouth/Throat:     Pharynx: No oropharyngeal exudate or posterior oropharyngeal erythema.  Skin:    Comments: Inflamed tick bite site on volar proximal left forearm. Not really warm or tender proximally  Neurological:     Mental Status: He is alert.            Assessment & Plan:

## 2020-07-01 NOTE — Telephone Encounter (Signed)
Well he almost certainly has COVID--but I think he should take the doxycycline just in case it could be tick fever. Does he want to consider antiviral medication for COVID---if he is not getting any sicker, not sure if he needs it

## 2020-07-01 NOTE — Telephone Encounter (Signed)
Patient left another voicemail requesting a call back about his positive covid test results.

## 2020-07-01 NOTE — Telephone Encounter (Signed)
Patient was called and he informed me that they did take a home Covid test which came back positive. Patient has been taking OTC medications for muscle pains and cough. Patient stated that he has been taking his doxycycline for the tick bite. Patient wanted to know if there was anything else he needed to do other than quarantine. Please advise.

## 2020-07-02 ENCOUNTER — Telehealth: Payer: Self-pay

## 2020-07-02 MED ORDER — MOLNUPIRAVIR 200 MG PO CAPS: 4.0000 | ORAL_CAPSULE | Freq: Two times a day (BID) | ORAL | 0 refills | Status: DC

## 2020-07-02 NOTE — Telephone Encounter (Signed)
Spoke to pt's wife. [facility]

## 2020-07-02 NOTE — Telephone Encounter (Signed)
Please let him know I [facility] have the medication

## 2020-07-02 NOTE — Telephone Encounter (Signed)
Should he stop taking the doxy since he is Covid positive and most likely not [facility] Spotted Fever. Symptoms started 5-23. Wife said to go ahead and send to [facility] in case he decides he does want to start the Paxlovid.

## 2020-07-02 NOTE — Telephone Encounter (Signed)
Josh with [facility] left v/m that their pharmacy cannot get and will not be getting the molnupiravir 200 mg. Merrily Pew will void this rx for pt and please send to different pharmacy. At this point [facility] the molnupiravir 200 mg. Sending note to Dr Silvio Pate and Larene Beach CMA.

## 2020-07-02 NOTE — Telephone Encounter (Signed)
As I indicated earlier, we can ask whether he would like to try one of the oral antiviral medications for COVID---as long as he knows they are just approved on an emergency use authorization (not fully FDA approved).

## 2020-07-02 NOTE — Telephone Encounter (Signed)
Spoke to  pt's wife .

## 2020-07-02 NOTE — Telephone Encounter (Signed)
I sent mulnupiravir due to risk of medication interactions. He should finish out the doxy----just in case

## 2020-07-02 NOTE — Addendum Note (Signed)
Addended by: Viviana Simpler I on: 07/02/2020 01:47 PM   Modules accepted: Orders

## 2020-07-08 DIAGNOSIS — Z8616 Personal history of COVID-19: Secondary | ICD-10-CM

## 2020-07-08 HISTORY — DX: Personal history of COVID-19: Z86.16

## 2020-07-13 ENCOUNTER — Other Ambulatory Visit: Payer: Self-pay | Admitting: Internal Medicine

## 2020-08-12 ENCOUNTER — Ambulatory Visit (INDEPENDENT_AMBULATORY_CARE_PROVIDER_SITE_OTHER): Payer: Medicare PPO

## 2020-08-12 ENCOUNTER — Ambulatory Visit: Payer: Medicare PPO | Admitting: Orthopaedic Surgery

## 2020-08-12 ENCOUNTER — Other Ambulatory Visit: Payer: Self-pay

## 2020-08-12 VITALS — Ht 70.0 in | Wt 217.2 lb

## 2020-08-12 DIAGNOSIS — G8929 Other chronic pain: Secondary | ICD-10-CM

## 2020-08-12 DIAGNOSIS — M25562 Pain in left knee: Secondary | ICD-10-CM

## 2020-08-12 DIAGNOSIS — M1712 Unilateral primary osteoarthritis, left knee: Secondary | ICD-10-CM

## 2020-08-12 MED ORDER — LIDOCAINE HCL 1 % IJ SOLN
3.0000 mL | INTRAMUSCULAR | Status: AC | PRN
  Administered 2020-08-12: 3 mL

## 2020-08-12 MED ORDER — METHYLPREDNISOLONE ACETATE 40 MG/ML IJ SUSP
40.0000 mg | INTRAMUSCULAR | Status: AC | PRN
  Administered 2020-08-12: 40 mg via INTRA_ARTICULAR

## 2020-08-12 NOTE — Progress Notes (Signed)
Office Visit Note   Patient: Aaron Zimmerman           Date of Birth: 1950/07/05           MRN: [id number] Visit Date: 08/12/2020              Requested by: Venia Carbon, MD [place],  [place] [id number] PCP: Venia Carbon, MD   Assessment & Plan: Visit Diagnoses:  1. Chronic pain of left knee   2. Unilateral primary osteoarthritis, left knee     Plan: I aspirated about 20 cc of clear fluid from the knee consistent with arthritis.  I placed a steroid injection in the knee with counseling him that this could increase his blood glucose and to watch this closely.  I do feel that he is a candidate eventually for hyaluronic acid for his left knee and I gave him a handout about this.  All questions and concerns were answered addressed.  Follow-up for now will be as needed.  If he starts developing some knee pain he will give [place] a call because that we have been asked hip will be hyaluronic acid.  Follow-Up Instructions: Return if symptoms worsen or fail to improve.   Orders:  Orders Placed This Encounter  Procedures   Large Joint Inj   XR Knee 1-2 Views Left   No orders of the defined types were placed in this encounter.     Procedures: Large Joint Inj: L knee on 08/12/2020 2:10 PM Indications: diagnostic evaluation and pain Details: 22 G 1.5 in needle, superolateral approach  Arthrogram: No  Medications: 3 mL lidocaine 1 %; 40 mg methylPREDNISolone acetate 40 MG/ML Outcome: tolerated well, no immediate complications Procedure, treatment alternatives, risks and benefits explained, specific risks discussed. Consent was given by the patient. Immediately prior to procedure a time out was called to verify the correct patient, procedure, equipment, support staff and site/side marked as required. Patient was prepped and draped in the usual sterile fashion.      Clinical Data: No additional findings.   Subjective: Chief Complaint  Patient presents  with   Left Knee - Pain  The patient is a very pleasant and active 70 year old who comes in with left knee pain is more chronic type of pain.  He says it has been sitting for long period time and goes to get up he has a lot of stiffness and pain in the left knee with no locking and catching.  It does not wake him up at night.  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  When he is on his knees and goes to get up is when it hurts and is stiff.  He has never had any type of surgery on his knees never had any type of injections.  He denies any other significant active medical issues.  He tries to stay active.  He is a diabetic.  He reports that his control has been better recently.  HPI  Review of Systems There is currently listed no headache, chest pain, shortness of breath, fever, chills, nausea, vomiting  Objective: Vital Signs: Ht 5\' 10"  (1.778 m)   Wt 217 lb 4 oz (98.5 kg)   BMI 31.17 kg/m   Physical Exam He is alert and orient x3 and in no acute distress Ortho Exam Examination of his left knee shows some global tenderness but full range of  motion.  There is slight patellofemoral crepitation.  There is no malalignment.  There is a mild effusion.  The knee is ligamentously stable. Specialty Comments:  No specialty comments available.  Imaging: XR Knee 1-2 Views Left  Result Date: 08/12/2020 2 views of the left knee only mild to moderate arthritic changes with slight narrowing of the patellofemoral joint and lateral compartment.  The alignment is neutral.    PMFS History: Patient Active Problem List   Diagnosis Date Noted   Chills 04/22/2020   Osteoarthritis of both knees 04/22/2020   Essential hypertension 12/23/2016   Aortic atherosclerosis ([place]) 11/10/2016   Fever 08/12/2016   Vitamin B12 deficiency    Routine health maintenance 03/09/2013   Polyp of colon, adenomatous 03/06/2013   Solitary pulmonary nodule 07/22/2012    B12 deficiency 03/01/2010   ALLERGIC RHINITIS, SEASONAL, MILD 04/28/2009   ONYCHOMYCOSIS, TOENAILS 03/02/2009   Type 2 diabetes mellitus with other circulatory complications ([place]) 80/88/1103   Hyperlipemia 01/22/2007   Past Medical History:  Diagnosis Date   Allergy    Cataract    left eye   Diabetes mellitus ~2006   Hyperlipidemia    Hypertension    take blood pressure medication preventive percaution   Personal history of urinary calculi    Vertigo    Vitamin B12 deficiency     Family History  Problem Relation Age of Onset   Heart disease Mother        CAD/PCI with stents; cardiomyopathy with AICD   Cancer Other        Colon and Prostate   Colon cancer Neg Hx    Colon polyps Neg Hx    Esophageal cancer Neg Hx    Rectal cancer Neg Hx    Stomach cancer Neg Hx     Past Surgical History:  Procedure Laterality Date   COLONOSCOPY     POLYPECTOMY     traumatic injury and repair of RT hand  1974   Social History   Occupational History   Occupation: Maintenance-- temperature controls    Employer: [employer]  Tobacco Use   Smoking status: Never   Smokeless tobacco: Former    Types: Chew    Quit date: 02/08/1995  Vaping Use   Vaping Use: Never used  Substance and Sexual Activity   Alcohol use: No   Drug use: No   Sexual activity: Yes

## 2020-12-02 DIAGNOSIS — R972 Elevated prostate specific antigen [PSA]: Secondary | ICD-10-CM | POA: Diagnosis not present

## 2020-12-08 DIAGNOSIS — E119 Type 2 diabetes mellitus without complications: Secondary | ICD-10-CM | POA: Diagnosis not present

## 2020-12-08 DIAGNOSIS — H2511 Age-related nuclear cataract, right eye: Secondary | ICD-10-CM | POA: Diagnosis not present

## 2020-12-08 DIAGNOSIS — H524 Presbyopia: Secondary | ICD-10-CM | POA: Diagnosis not present

## 2020-12-08 LAB — HM DIABETES EYE EXAM

## 2020-12-09 DIAGNOSIS — R972 Elevated prostate specific antigen [PSA]: Secondary | ICD-10-CM | POA: Diagnosis not present

## 2020-12-09 DIAGNOSIS — N400 Enlarged prostate without lower urinary tract symptoms: Secondary | ICD-10-CM | POA: Diagnosis not present

## 2020-12-15 ENCOUNTER — Ambulatory Visit (INDEPENDENT_AMBULATORY_CARE_PROVIDER_SITE_OTHER): Payer: Medicare PPO | Admitting: Internal Medicine

## 2020-12-15 ENCOUNTER — Encounter: Payer: Self-pay | Admitting: Internal Medicine

## 2020-12-15 ENCOUNTER — Other Ambulatory Visit: Payer: Self-pay

## 2020-12-15 VITALS — BP 118/80 | HR 73 | Temp 98.1°F | Ht 68.0 in | Wt 219.0 lb

## 2020-12-15 DIAGNOSIS — I700 Atherosclerosis of aorta: Secondary | ICD-10-CM

## 2020-12-15 DIAGNOSIS — E538 Deficiency of other specified B group vitamins: Secondary | ICD-10-CM

## 2020-12-15 DIAGNOSIS — Z7189 Other specified counseling: Secondary | ICD-10-CM

## 2020-12-15 DIAGNOSIS — Z0000 Encounter for general adult medical examination without abnormal findings: Secondary | ICD-10-CM | POA: Diagnosis not present

## 2020-12-15 DIAGNOSIS — E1159 Type 2 diabetes mellitus with other circulatory complications: Secondary | ICD-10-CM | POA: Diagnosis not present

## 2020-12-15 DIAGNOSIS — I10 Essential (primary) hypertension: Secondary | ICD-10-CM | POA: Diagnosis not present

## 2020-12-15 LAB — HEPATIC FUNCTION PANEL
ALT: 23 U/L (ref 0–53)
AST: 24 U/L (ref 0–37)
Albumin: 4.7 g/dL (ref 3.5–5.2)
Alkaline Phosphatase: 44 U/L (ref 39–117)
Bilirubin, Direct: 0.2 mg/dL (ref 0.0–0.3)
Total Bilirubin: 0.9 mg/dL (ref 0.2–1.2)
Total Protein: 7.1 g/dL (ref 6.0–8.3)

## 2020-12-15 LAB — LIPID PANEL
Cholesterol: 105 mg/dL (ref 0–200)
HDL: 31.8 mg/dL — ABNORMAL LOW (ref >39.00)
LDL Cholesterol: 55 mg/dL (ref 0–99)
NonHDL: 72.72
Total CHOL/HDL Ratio: 3
Triglycerides: 88 mg/dL (ref 0.0–149.0)
VLDL: 17.6 mg/dL (ref 0.0–40.0)

## 2020-12-15 LAB — CBC
HCT: 48.5 % (ref 39.0–52.0)
Hemoglobin: 16.1 g/dL (ref 13.0–17.0)
MCHC: 33.2 g/dL (ref 30.0–36.0)
MCV: 85.1 fl (ref 78.0–100.0)
Platelets: 219 10*3/uL (ref 150.0–400.0)
RBC: 5.71 Mil/uL (ref 4.22–5.81)
RDW: 13.1 % (ref 11.5–15.5)
WBC: 5.8 10*3/uL (ref 4.0–10.5)

## 2020-12-15 LAB — RENAL FUNCTION PANEL
Albumin: 4.7 g/dL (ref 3.5–5.2)
BUN: 12 mg/dL (ref 6–23)
CO2: 29 mEq/L (ref 19–32)
Calcium: 9.5 mg/dL (ref 8.4–10.5)
Chloride: 101 mEq/L (ref 96–112)
Creatinine, Ser: 0.91 mg/dL (ref 0.40–1.50)
GFR: 86.86 mL/min (ref >60.00)
Glucose, Bld: 69 mg/dL — ABNORMAL LOW (ref 70–99)
Phosphorus: 3.5 mg/dL (ref 2.3–4.6)
Potassium: 4.4 mEq/L (ref 3.5–5.1)
Sodium: 138 mEq/L (ref 135–145)

## 2020-12-15 LAB — HM DIABETES FOOT EXAM

## 2020-12-15 LAB — VITAMIN B12: Vitamin B-12: 499 pg/mL (ref 211–911)

## 2020-12-15 LAB — HEMOGLOBIN A1C: Hgb A1c MFr Bld: 8 % — ABNORMAL HIGH (ref 4.6–6.5)

## 2020-12-15 MED ORDER — SILDENAFIL CITRATE 20 MG PO TABS: ORAL_TABLET | ORAL | 11 refills | Status: DC

## 2020-12-15 NOTE — Assessment & Plan Note (Signed)
See social history Blank forms given 

## 2020-12-15 NOTE — Assessment & Plan Note (Signed)
On imaging Is on statin 

## 2020-12-15 NOTE — Assessment & Plan Note (Signed)
Is on oral supplement

## 2020-12-15 NOTE — Assessment & Plan Note (Signed)
Seems to have acceptable control on tresiba, metformin and farxiga Mild neuropathy--only intermittent so no Rx

## 2020-12-15 NOTE — Progress Notes (Signed)
Hearing Screening - Comments:: Passed whisper test Vision Screening - Comments:: November 2022  

## 2020-12-15 NOTE — Progress Notes (Signed)
Subjective:    Patient ID: Aaron Zimmerman, male    DOB: 15-Aug-1950, 70 y.o.   MRN: [id number]  HPI Here for initial Medicare wellness visit and follow up of chronic health conditions This visit occurred during the SARS-CoV-2 public health emergency.  Safety protocols were in place, including screening questions prior to the visit, additional usage of staff PPE, and extensive cleaning of exam room while observing appropriate contact time as indicated for disinfecting solutions.   Reviewed advanced directives Reviewed other doctors--Aaron Zimmerman--ophthal, Aaron Zimmerman urology, [facility], Dermatologist on [address], Aaron Zimmerman No hospitalizations or surgery No alcohol or tobacco Vision is okay---needs new glasses. Had exam last week Stays active ---does some walking  Hearing is good No falls No depression or anhedonia (just down since uncle's recent death) Independent with instrumental ADLs No memory problems  Loves retirement Some issues with inflation Has remodeled kitchen and dining room Redoing the heating system   Wife is concerned about "hernia" in midline He has no pain and is not bothered by this  PSA went up Is going to get a biopsy  Checks sugars most days Highest in 140's No hypoglycemic reactions---unless he misses a meal No sig foot burning or pain---just rarely  No chest pain No SOB Some cough per wife--he isn't concerned No palpitations  No dizziness or syncope  Known aortic atherosclerosis on imaging Is on statin  Current Outpatient Medications on File Prior to Visit  Medication Sig Dispense Refill   cetirizine (ZYRTEC) 10 MG tablet Take 1 tablet (10 mg total) by mouth daily as needed for allergies. 90 tablet 3   Cyanocobalamin (B-12 PO) Take 1 tablet by mouth daily.      FARXIGA 10 MG TABS tablet TAKE 1 TABLET BY MOUTH ONCE DAILY 90 tablet 3   GNP ASPIRIN LOW DOSE 81 MG EC tablet TAKE 1 TABLET BY MOUTH DAILY SWALLOW WHOLE  90 tablet 3   insulin degludec (TRESIBA FLEXTOUCH) 100 UNIT/ML FlexTouch Pen Inject 50 Units into the skin daily. 45 mL 3   Insulin Pen Needle (PEN NEEDLES) 32G X 6 MM MISC 1 each by Does not apply route daily. 100 each 3   lisinopril (ZESTRIL) 10 MG tablet TAKE 1 TABLET BY MOUTH ONCE A DAY 90 tablet 3   metFORMIN (GLUCOPHAGE) 1000 MG tablet TAKE 1 TABLET BY MOUTH TWICE A DAY WITH MEALS 180 tablet 3   sildenafil (REVATIO) 20 MG tablet TAKE 3 TABLES (60 MG TOTAL) BY MOUTH DAILY AS NEEDED. 50 tablet 11   simvastatin (ZOCOR) 20 MG tablet TAKE ONE TABLET BY MOUTH EVERY NIGHT AT BEDTIME 90 tablet 3   No current facility-administered medications on file prior to visit.    Allergies  Allergen Reactions   Pioglitazone Rash    Past Medical History:  Diagnosis Date   Allergy    Cataract    left eye   Diabetes mellitus ~2006   Hyperlipidemia    Hypertension    take blood pressure medication preventive percaution   Personal history of urinary calculi    Vertigo    Vitamin B12 deficiency     Past Surgical History:  Procedure Laterality Date   COLONOSCOPY     POLYPECTOMY     traumatic injury and repair of RT hand  1974    Family History  Problem Relation Age of Onset   Heart disease Mother        CAD/PCI with stents; cardiomyopathy with AICD   Cancer Other  Colon and Prostate   Colon cancer Neg Hx    Colon polyps Neg Hx    Esophageal cancer Neg Hx    Rectal cancer Neg Hx    Stomach cancer Neg Hx     Social History   Socioeconomic History   Marital status: Married    Spouse name: Not on file   Number of children: 2   Years of education: Not on file   Highest education level: Not on file  Occupational History   Occupation: Maintenance-- temperature controls    Employer: [employer]    Comment: 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  Tobacco Use   Smoking status: Never   Smokeless tobacco: Former    Types: Chew    Quit date: 02/08/1995  Vaping Use   Vaping Use: Never used   Substance and Sexual Activity   Alcohol use: No   Drug use: No   Sexual activity: Yes  Other Topics Concern   Not on file  Social History Narrative   HSG   Married [phone] son 1979 1 daughter 52;    4 grandchildren      No living will   Wife would be health care POA--children are alternates   Would accept resuscitation but no prolonged ventilation or feeding tube if cognitively unaware   Social Determinants of Health   Financial Resource Strain: Not on file  Food Insecurity: Not on file  Transportation Needs: Not on file  Physical Activity: Not on file  Stress: Not on file  Social Connections: Not on file  Intimate Partner Violence: Not on file   Review of Systems Appetite is good Weight down a few pounds since last year Sleeps well--some trouble since uncle died a month ago No heartburn or dysphagia now Due for crown soon Keeps up with derm---nothing currently that needs to be checked Bowels move fine---no blood Voids fine---nocturia x 1 generally No sig back pain. Did have fluid in left knee--removed by ortho. Just a little sick    Objective:   Physical Exam Constitutional:      Appearance: Normal appearance.  HENT:     Mouth/Throat:     Comments: No lesions Eyes:     Conjunctiva/sclera: Conjunctivae normal.     Pupils: Pupils are equal, round, and reactive to light.  Cardiovascular:     Rate and Rhythm: Normal rate and regular rhythm.     Pulses: Normal pulses.     Heart sounds: No murmur heard.   No gallop.  Pulmonary:     Effort: Pulmonary effort is normal.     Breath sounds: Normal breath sounds. No wheezing or rales.  Abdominal:     Palpations: Abdomen is soft.     Tenderness: There is no abdominal tenderness.     Comments: No true hernia--just diastasis recti  Musculoskeletal:     Cervical back: Neck supple.     Right lower leg: No edema.     Left lower leg: No edema.  Lymphadenopathy:     Cervical: No cervical adenopathy.  Skin:    Findings:  No rash.     Comments: No foot lesions  Neurological:     Mental Status: He is alert and oriented to person, place, and time.     Comments: President---"Biden, Trump, Obama" 100-93-85-78-71-64 D-l-[id number]-w Recall 3/3  Fairly normal sensation in feet  Psychiatric:        Mood and Affect: Mood normal.        Behavior: Behavior normal.  Assessment & Plan:

## 2020-12-15 NOTE — Assessment & Plan Note (Signed)
BP Readings from Last 3 Encounters:  12/15/20 118/80  06/30/20 120/70  06/09/20 116/68   Good control on lisinopril Will check labs

## 2020-12-15 NOTE — Assessment & Plan Note (Signed)
I have personally reviewed the Medicare Annual Wellness questionnaire and have noted 1. The patient's medical and social history 2. Their use of alcohol, tobacco or illicit drugs 3. Their current medications and supplements 4. The patient's functional ability including ADL's, fall risks, home safety risks and hearing or visual             impairment. 5. Diet and physical activities 6. Evidence for depression or mood disorders  The patients weight, height, BMI and visual acuity have been recorded in the chart I have made referrals, counseling and provided education to the patient based review of the above and I have provided the pt with a written personalized care plan for preventive services.  I have provided you with a copy of your personalized plan for preventive services. Please take the time to review along with your updated medication list.  Discussed exercise Colon due 2027 Getting prostate biopsy due to elevated PSA Will get flu vaccine and bivalent COVID at pharmacy

## 2021-01-08 DIAGNOSIS — R972 Elevated prostate specific antigen [PSA]: Secondary | ICD-10-CM | POA: Diagnosis not present

## 2021-01-08 DIAGNOSIS — C61 Malignant neoplasm of prostate: Secondary | ICD-10-CM | POA: Diagnosis not present

## 2021-01-14 DIAGNOSIS — C61 Malignant neoplasm of prostate: Secondary | ICD-10-CM | POA: Diagnosis not present

## 2021-01-18 NOTE — Progress Notes (Signed)
GU Location of Tumor / Histology: Prostate Ca  If Prostate Cancer, Gleason Score is (3 + 3) and PSA is (6.42 as of 01/08/21)  Biopsies  Dr. Milford Cage         Past/Anticipated interventions by urology, if any:   Past/Anticipated interventions by medical oncology, if any:   Weight changes, if any:  No  IPSS:  6 SHIM:  5  Bowel/Bladder complaints, if any:  No bowel or bladder issues.  Nausea/Vomiting, if any:  No  Pain issues, if any:  0/10  SAFETY ISSUES: Prior radiation?  No Pacemaker/ICD? No Possible current pregnancy?  Male Is the patient on methotrexate? No  Current Complaints / other details:  Need more information on treatment options.

## 2021-01-19 ENCOUNTER — Other Ambulatory Visit: Payer: Self-pay

## 2021-01-19 ENCOUNTER — Ambulatory Visit
Admission: RE | Admit: 2021-01-19 | Discharge: 2021-01-19 | Disposition: A | Discharge status: Home / Self Care | Payer: Medicare PPO | Source: Ambulatory Visit | Attending: Radiation Oncology | Admitting: Radiation Oncology

## 2021-01-19 DIAGNOSIS — C61 Malignant neoplasm of prostate: Secondary | ICD-10-CM | POA: Insufficient documentation

## 2021-01-19 DIAGNOSIS — E785 Hyperlipidemia, unspecified: Secondary | ICD-10-CM | POA: Diagnosis not present

## 2021-01-19 DIAGNOSIS — Z7984 Long term (current) use of oral hypoglycemic drugs: Secondary | ICD-10-CM | POA: Diagnosis not present

## 2021-01-19 DIAGNOSIS — E119 Type 2 diabetes mellitus without complications: Secondary | ICD-10-CM | POA: Diagnosis not present

## 2021-01-19 DIAGNOSIS — I10 Essential (primary) hypertension: Secondary | ICD-10-CM | POA: Diagnosis not present

## 2021-01-19 DIAGNOSIS — E538 Deficiency of other specified B group vitamins: Secondary | ICD-10-CM | POA: Insufficient documentation

## 2021-01-19 DIAGNOSIS — N529 Male erectile dysfunction, unspecified: Secondary | ICD-10-CM | POA: Insufficient documentation

## 2021-01-19 DIAGNOSIS — Z794 Long term (current) use of insulin: Secondary | ICD-10-CM | POA: Diagnosis not present

## 2021-01-19 DIAGNOSIS — Z8546 Personal history of malignant neoplasm of prostate: Secondary | ICD-10-CM | POA: Insufficient documentation

## 2021-01-19 NOTE — Progress Notes (Signed)
Radiation Oncology         [phone]) [phone] ________________________________  Initial Outpatient Consultation  Name: Aaron Zimmerman MRN: [id number]  Date: 01/19/2021  DOB: Dec 18, 1950  [id number], Theophilus Kinds, MD  Remi Haggard, MD   REFERRING PHYSICIAN: Remi Haggard, MD  DIAGNOSIS: 70 y.o. gentleman with Stage T1c adenocarcinoma of the prostate with Gleason score of 3+4, and PSA of 6.42.    ICD-10-CM   1. Malignant neoplasm of prostate ([place])  C61       HISTORY OF PRESENT ILLNESS: Aaron Zimmerman is a 70 y.o. male with a diagnosis of prostate cancer. He was previously seen by Urology back in fall 2018 for hematuria and LUTS with a negative workup. He was referred back to Dr. Milford Cage on 12/05/19 for an elevated PSA of 4.09. His DRE was without concerning findings so he opted to monitor the PSA closely. While his PSA initially remained stable at 4.08 on repeat in 05/2020, it increased to 6.42 on recheck in 11/2020. The patient proceeded to transrectal ultrasound with 12 biopsies of the prostate on 01/08/21.  The prostate volume measured 50.54 cc.  Out of 12 core biopsies, 5 were positive, all on the right.  The maximum Gleason score was 3+4, and this was seen in the right base lateral and right base. Additionally, Gleason 3+3 was seen in the right mid lateral, right apex lateral, and right mid.  The patient reviewed the biopsy results with his urologist and he has kindly been referred today for discussion of potential radiation treatment options.   PREVIOUS RADIATION THERAPY: No  PAST MEDICAL HISTORY:  Past Medical History:  Diagnosis Date   Allergy    Cataract    left eye   Diabetes mellitus ~2006   Hyperlipidemia    Hypertension    take blood pressure medication preventive percaution   Personal history of urinary calculi    Vertigo    Vitamin B12 deficiency       PAST SURGICAL HISTORY: Past Surgical History:  Procedure Laterality Date   COLONOSCOPY     POLYPECTOMY      traumatic injury and repair of RT hand  1974    FAMILY HISTORY:  Family History  Problem Relation Age of Onset   Heart disease Mother        CAD/PCI with stents; cardiomyopathy with AICD   Cancer Other        Colon and Prostate   Colon cancer Neg Hx    Colon polyps Neg Hx    Esophageal cancer Neg Hx    Rectal cancer Neg Hx    Stomach cancer Neg Hx     SOCIAL HISTORY:  Social History   Socioeconomic History   Marital status: Married    Spouse name: Not on file   Number of children: 2   Years of education: Not on file   Highest education level: Not on file  Occupational History   Occupation: Maintenance-- temperature controls    Employer: [employer]    Comment: 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  Tobacco Use   Smoking status: Never   Smokeless tobacco: Former    Types: Chew    Quit date: 02/08/1995  Vaping Use   Vaping Use: Never used  Substance and Sexual Activity   Alcohol use: No   Drug use: No   Sexual activity: Yes  Other Topics Concern   Not on file  Social History Narrative   HSG   Married [phone] son 1979 1  daughter 53;    4 grandchildren      No living will   Wife would be health care POA--children are alternates   Would accept resuscitation but no prolonged ventilation or feeding tube if cognitively unaware   Social Determinants of Health   Financial Resource Strain: Not on file  Food Insecurity: Not on file  Transportation Needs: Not on file  Physical Activity: Not on file  Stress: Not on file  Social Connections: Not on file  Intimate Partner Violence: Not on file    ALLERGIES: Pioglitazone  MEDICATIONS:  Current Outpatient Medications  Medication Sig Dispense Refill   cetirizine (ZYRTEC) 10 MG tablet Take 1 tablet (10 mg total) by mouth daily as needed for allergies. 90 tablet 3   Cyanocobalamin (B-12 PO) Take 1 tablet by mouth daily.      FARXIGA 10 MG TABS tablet TAKE 1 TABLET BY MOUTH ONCE DAILY 90 tablet 3   GNP ASPIRIN LOW DOSE 81 MG EC  tablet TAKE 1 TABLET BY MOUTH DAILY SWALLOW WHOLE 90 tablet 3   insulin degludec (TRESIBA FLEXTOUCH) 100 UNIT/ML FlexTouch Pen Inject 50 Units into the skin daily. 45 mL 3   Insulin Pen Needle (PEN NEEDLES) 32G X 6 MM MISC 1 each by Does not apply route daily. 100 each 3   lisinopril (ZESTRIL) 10 MG tablet TAKE 1 TABLET BY MOUTH ONCE A DAY 90 tablet 3   metFORMIN (GLUCOPHAGE) 1000 MG tablet TAKE 1 TABLET BY MOUTH TWICE A DAY WITH MEALS 180 tablet 3   sildenafil (REVATIO) 20 MG tablet TAKE 3 TABLES (60 MG TOTAL) BY MOUTH DAILY AS NEEDED. 50 tablet 11   simvastatin (ZOCOR) 20 MG tablet TAKE ONE TABLET BY MOUTH EVERY NIGHT AT BEDTIME 90 tablet 3   No current facility-administered medications for this encounter.    REVIEW OF SYSTEMS:  On review of systems, the patient reports that he is doing well overall. He denies any chest pain, shortness of breath, cough, fevers, chills, night sweats, unintended weight changes. He denies any bowel disturbances, and denies abdominal pain, nausea or vomiting. He denies any new musculoskeletal or joint aches or pains. His IPSS was 6, indicating mild urinary symptoms. His SHIM was 5, indicating he has severe erectile dysfunction. A complete review of systems is obtained and is otherwise negative.    PHYSICAL EXAM:  Wt Readings from Last 3 Encounters:  01/19/21 217 lb 12.8 oz (98.8 kg)  12/15/20 219 lb (99.3 kg)  08/12/20 217 lb 4 oz (98.5 kg)   Temp Readings from Last 3 Encounters:  01/19/21 (!) 97.4 F (36.3 C) (Oral)  12/15/20 98.1 F (36.7 C)  06/30/20 98.4 F (36.9 C) (Temporal)   BP Readings from Last 3 Encounters:  01/19/21 119/70  12/15/20 118/80  06/30/20 120/70   Pulse Readings from Last 3 Encounters:  01/19/21 73  12/15/20 73  06/30/20 85   Pain Assessment Pain Score: 0-No pain/10  In general this is a well appearing Caucasian male in no acute distress. He's alert and oriented x4 and appropriate throughout the examination.  Cardiopulmonary assessment is negative for acute distress, and he exhibits normal effort.     KPS = 90  100 - Normal; no complaints; no evidence of disease. 90   - Able to carry on normal activity; minor signs or symptoms of disease. 80   - Normal activity with effort; some signs or symptoms of disease. 83   - Cares for self; unable to carry on normal  activity or to do active work. 60   - Requires occasional assistance, but is able to care for most of his personal needs. 50   - Requires considerable assistance and frequent medical care. 104   - Disabled; requires special care and assistance. 73   - Severely disabled; hospital admission is indicated although death not imminent. 82   - Very sick; hospital admission necessary; active supportive treatment necessary. 10   - Moribund; fatal processes progressing rapidly. 0     - Dead  Karnofsky DA, Abelmann [place], Craver LS and Burchenal [facility] [phone]) The use of the nitrogen mustards in the palliative treatment of carcinoma: with particular reference to bronchogenic carcinoma Cancer 1 634-56  LABORATORY DATA:  Lab Results  Component Value Date   WBC 5.8 12/15/2020   HGB 16.1 12/15/2020   HCT 48.5 12/15/2020   MCV 85.1 12/15/2020   PLT 219.0 12/15/2020   Lab Results  Component Value Date   NA 138 12/15/2020   K 4.4 12/15/2020   CL 101 12/15/2020   CO2 29 12/15/2020   Lab Results  Component Value Date   ALT 23 12/15/2020   AST 24 12/15/2020   ALKPHOS 44 12/15/2020   BILITOT 0.9 12/15/2020     RADIOGRAPHY: No results found.    IMPRESSION/PLAN: 1. 70 y.o. gentleman with Stage T1c adenocarcinoma of the prostate with Gleason Score of 3+4, and PSA of 6.42. We discussed the patient's workup and outlined the nature of prostate cancer in this setting. The patient's T stage, Gleason's score, and PSA put him into the favorable intermediate risk group. Accordingly, he is eligible for a variety of potential treatment options including  brachytherapy, 5.5 weeks of external radiation, or prostatectomy. We discussed the available radiation techniques, and focused on the details and logistics of delivery. We discussed and outlined the risks, benefits, short and long-term effects associated with radiotherapy and compared and contrasted these with prostatectomy. We discussed the role of SpaceOAR gel in reducing the rectal toxicity associated with radiotherapy. He appears to have a good understanding of his disease and our treatment recommendations which are of curative intent.  He was encouraged to ask questions that were answered to his stated satisfaction.  At the conclusion of our conversation, the patient is interested in moving forward with brachytherapy and use of SpaceOAR gel to reduce rectal toxicity from radiotherapy.  We will share our discussion with Dr. Milford Cage and move forward with scheduling his CT [facility] planning appointment in the near future.  The patient will be contacted by Romie Jumper in our office who will be working closely with him to coordinate OR scheduling and pre and post procedure appointments.  We will contact the pharmaceutical rep to ensure that Pomaria is available at the time of procedure.  We enjoyed meeting him today and look forward to continuing to participate in his care.  We personally spent 70 minutes in this encounter including chart review, reviewing radiological studies, meeting face-to-face with the patient, entering orders and completing documentation.    Nicholos Johns, PA-C    Tyler Pita, MD  [facility] Direct Dial: [phone]   Fax: [phone] [email or web address]   Skype   LinkedIn   This document serves as a record of services personally performed by Tyler Pita, MD and Freeman Caldron, PA-C. It was created on their behalf by Wilburn Mylar, a trained medical scribe. The creation of this record is based on the scribe's personal observations and the  provider's  statements to them. This document has been checked and approved by the attending provider.

## 2021-01-19 NOTE — Progress Notes (Signed)
Introduced myself to the patient as the prostate nurse navigator.  No barriers to care identified at this time.  He is here to discuss his radiation treatment options.  I gave him my business card and asked him to call me with questions or concerns.  Verbalized understanding.  ?

## 2021-01-20 ENCOUNTER — Telehealth: Payer: Self-pay | Admitting: Internal Medicine

## 2021-01-20 ENCOUNTER — Telehealth: Payer: Self-pay | Admitting: *Deleted

## 2021-01-20 NOTE — Telephone Encounter (Signed)
CALLED PATIENT TO ASK QUESTIONS, SPOKE WITH PATIENT'S WIFE- [name]

## 2021-01-20 NOTE — Telephone Encounter (Signed)
Pt called asking for a return call. Pt states that he has a question for you. Pt wouldn't go in to detail of what the question was.

## 2021-01-21 NOTE — Telephone Encounter (Signed)
I have tried to contact pt but phone is messed up.

## 2021-01-21 NOTE — Telephone Encounter (Addendum)
Pt was diagnosed with Stage 2 prostate cancer. Planning to do low dose radiation. Wanted to make sure Dr Silvio Pate knew.

## 2021-01-25 ENCOUNTER — Telehealth: Payer: Self-pay | Admitting: *Deleted

## 2021-01-25 NOTE — Telephone Encounter (Signed)
XXXX 

## 2021-01-25 NOTE — Telephone Encounter (Signed)
CALLED PATIENT TO UPDATE, LVM FOR A RETURN CALL 

## 2021-01-28 ENCOUNTER — Telehealth: Payer: Self-pay | Admitting: *Deleted

## 2021-01-28 NOTE — Telephone Encounter (Signed)
Called patient to inform of implant date, spoke with patient and he is aware of this date. 

## 2021-02-07 HISTORY — PX: CATARACT EXTRACTION W/ INTRAOCULAR LENS IMPLANT: SHX1309

## 2021-02-11 ENCOUNTER — Other Ambulatory Visit: Payer: Self-pay | Admitting: Internal Medicine

## 2021-02-16 ENCOUNTER — Other Ambulatory Visit: Payer: Self-pay | Admitting: Urology

## 2021-03-02 ENCOUNTER — Other Ambulatory Visit: Payer: Self-pay | Admitting: Urology

## 2021-03-03 ENCOUNTER — Telehealth: Payer: Self-pay | Admitting: *Deleted

## 2021-03-03 NOTE — Telephone Encounter (Signed)
CALLED PATIENT TO REMIND OF PRE-SEED APPTS. FOR 03-04-21, LVM FOR A RETURN CALL

## 2021-03-04 ENCOUNTER — Encounter (HOSPITAL_COMMUNITY)
Admission: RE | Admit: 2021-03-04 | Discharge: 2021-03-04 | Disposition: A | Discharge status: Home / Self Care | Payer: Medicare PPO | Source: Ambulatory Visit | Attending: Urology | Admitting: Urology

## 2021-03-04 ENCOUNTER — Other Ambulatory Visit: Payer: Self-pay

## 2021-03-04 ENCOUNTER — Encounter: Payer: Self-pay | Admitting: Urology

## 2021-03-04 ENCOUNTER — Ambulatory Visit
Admission: RE | Admit: 2021-03-04 | Discharge: 2021-03-04 | Disposition: A | Discharge status: Home / Self Care | Payer: Medicare PPO | Source: Ambulatory Visit | Attending: Radiation Oncology | Admitting: Radiation Oncology

## 2021-03-04 ENCOUNTER — Ambulatory Visit
Admission: RE | Admit: 2021-03-04 | Discharge: 2021-03-04 | Disposition: A | Discharge status: Home / Self Care | Payer: Medicare PPO | Source: Ambulatory Visit | Attending: Urology | Admitting: Urology

## 2021-03-04 VITALS — Resp 19 | Ht 70.0 in | Wt 219.0 lb

## 2021-03-04 DIAGNOSIS — Z01810 Encounter for preprocedural cardiovascular examination: Secondary | ICD-10-CM | POA: Insufficient documentation

## 2021-03-04 DIAGNOSIS — C61 Malignant neoplasm of prostate: Secondary | ICD-10-CM

## 2021-03-04 NOTE — Progress Notes (Incomplete)
°  Radiation Oncology         [phone] ________________________________  Name: Aaron Zimmerman MRN: [id number]  Date: 03/04/2021  DOB: 08-11-1950  SIMULATION AND TREATMENT PLANNING NOTE PUBIC ARCH STUDY  [id number], Theophilus Kinds, MD  Remi Haggard, MD  DIAGNOSIS:  71 y.o. gentleman with Stage T1c adenocarcinoma of the prostate with Gleason score of 3+4, and PSA of 6.42.  Oncology History  Malignant neoplasm of prostate ([place])  01/08/2021 Cancer Staging   Staging form: Prostate, AJCC 8th Edition - Clinical stage from 01/08/2021: Stage IIB (cT1c, cN0, cM0, PSA: 6.4, Grade Group: 2) - Signed by Freeman Caldron, PA-C on 01/19/2021 Histopathologic type: Adenocarcinoma, NOS Stage prefix: Initial diagnosis Prostate specific antigen (PSA) range: Less than 10 Gleason primary pattern: 3 Gleason secondary pattern: 4 Gleason score: 7 Histologic grading system: 5 grade system Number of biopsy cores examined: 12 Number of biopsy cores positive: 5 Location of positive needle core biopsies: One side    01/19/2021 Initial Diagnosis   Malignant neoplasm of prostate ([place])       ICD-10-CM   1. Malignant neoplasm of prostate ([place])  C61       COMPLEX SIMULATION:  The patient presented today for evaluation for possible prostate seed implant. He was brought to the radiation planning suite and placed supine on the CT couch. A 3-dimensional image study set was obtained in upload to the planning computer. There, on each axial slice, I contoured the prostate gland. Then, using three-dimensional radiation planning tools I reconstructed the prostate in view of the structures from the transperineal needle pathway to assess for possible pubic arch interference. In doing so, I did not appreciate any pubic arch interference. Also, the patient's prostate volume was estimated based on the drawn structure. The volume was *** cc.  Given the pubic arch appearance and prostate volume, patient remains a good  candidate to proceed with prostate seed implant. Today, he freely provided informed written consent to proceed.    PLAN: The patient will undergo prostate seed implant.   ________________________________  Sheral Apley. Tammi Klippel, M.D.

## 2021-03-04 NOTE — Progress Notes (Signed)
Spoke w/ patient, verified identity, and begin nursing interview w/ spouse "Royal Hawthorn" in attendance. Patient states "Doing well. No symptoms to report at this time."  Meaningful use complete. No current urinary management medications. Urology Follow-up- February, 2023  Resp 19    Ht 5\' 9"  (1.753 m)    Wt 219 lb (99.3 kg)    BMI 32.34 kg/m

## 2021-03-10 NOTE — Progress Notes (Signed)
°  Radiation Oncology         [phone] ________________________________  Name: Aaron Zimmerman MRN: [id number]  Date: 03/04/2021  DOB: [birth year]  SIMULATION AND TREATMENT PLANNING NOTE PUBIC ARCH STUDY  [id number], Theophilus Kinds, MD  Remi Haggard, MD  DIAGNOSIS:  Oncology History  Malignant neoplasm of prostate ([place])  01/08/2021 Cancer Staging   Staging form: Prostate, AJCC 8th Edition - Clinical stage from 01/08/2021: Stage IIB (cT1c, cN0, cM0, PSA: 6.4, Grade Group: 2) - Signed by Freeman Caldron, PA-C on 01/19/2021 Histopathologic type: Adenocarcinoma, NOS Stage prefix: Initial diagnosis Prostate specific antigen (PSA) range: Less than 10 Gleason primary pattern: 3 Gleason secondary pattern: 4 Gleason score: 7 Histologic grading system: 5 grade system Number of biopsy cores examined: 12 Number of biopsy cores positive: 5 Location of positive needle core biopsies: One side    01/19/2021 Initial Diagnosis   Malignant neoplasm of prostate ([place])       ICD-10-CM   1. Malignant neoplasm of prostate ([place])  C61       COMPLEX SIMULATION:  The patient presented today for evaluation for possible prostate seed implant. He was brought to the radiation planning suite and placed supine on the CT couch. A 3-dimensional image study set was obtained in upload to the planning computer. There, on each axial slice, I contoured the prostate gland. Then, using three-dimensional radiation planning tools I reconstructed the prostate in view of the structures from the transperineal needle pathway to assess for possible pubic arch interference. In doing so, I did not appreciate any pubic arch interference. Also, the patient's prostate volume was estimated based on the drawn structure. The volume was 50 cc.  Given the pubic arch appearance and prostate volume, patient remains a good candidate to proceed with prostate seed implant. Today, he freely provided informed written consent to  proceed.    PLAN: The patient will undergo prostate seed implant.   ________________________________  Sheral Apley. Tammi Klippel, M.D.

## 2021-03-24 ENCOUNTER — Other Ambulatory Visit: Payer: Self-pay

## 2021-03-24 ENCOUNTER — Encounter (HOSPITAL_BASED_OUTPATIENT_CLINIC_OR_DEPARTMENT_OTHER): Payer: Self-pay | Admitting: Urology

## 2021-03-24 NOTE — Progress Notes (Addendum)
Spoke w/ via phone for pre-op interview---pt Lab needs dos---- I stat              Lab results------ekg 03-04-2021 chart/epic COVID test -----patient states asymptomatic no test needed Arrive at -------1115 am 03-30-2021 NPO after MN NO Solid Food.  Clear liquids from MN until---1015 am Med rec completed Medications to take morning of surgery -----zyrtec prn,  do not take lisinopril day of surgery Diabetic medication -----take 1/2 dose hs tresiba (take 25 units) night before surgery on 03-29-2021, no diabetic medications day of surgery Patient instructed no nail polish to be worn day of surgery Patient instructed to bring photo id and insurance card day of surgery Patient aware to have Driver (ride ) / caregiver   son scott driver wife linda to come in waiting roon, wife linda caregiver night of surgery  for 24 hours after surgery  Patient Special Instructions -----fleets enema am of surgery Pre-Op special Istructions -----none Patient verbalized understanding of instructions that were given at this phone interview. Patient denies shortness of breath, chest pain, fever, cough at this phone interview.   Pt stopped 81 mg aspirin on own on 03-10-2021

## 2021-03-26 ENCOUNTER — Inpatient Hospital Stay (HOSPITAL_COMMUNITY): Admission: RE | Admit: 2021-03-26 | Payer: Medicare PPO | Source: Ambulatory Visit

## 2021-03-26 DIAGNOSIS — E538 Deficiency of other specified B group vitamins: Secondary | ICD-10-CM

## 2021-03-26 DIAGNOSIS — E1159 Type 2 diabetes mellitus with other circulatory complications: Secondary | ICD-10-CM

## 2021-03-29 ENCOUNTER — Telehealth: Payer: Self-pay | Admitting: *Deleted

## 2021-03-29 NOTE — Telephone Encounter (Signed)
CALLED PATIENT TO REMIND OF PROCEDURE FOR 03-30-21, SPOKE WITH PATIENT AND HE IS AWARE OF THIS PROCEDURE

## 2021-03-29 NOTE — H&P (Signed)
HPI: Aaron Zimmerman is a 71 year-old male patient who is here for blood in the urine.   He first noticed the symptoms 11/06/2016. He did see the blood in his urine. He has seen blood clots.   He does not have a burning sensation when he urinates. He is not currently having trouble urinating.   He has had kidney stones. He is not having pain. He has not recently had unwanted weight loss.   His last U/S or CT Scan was 11/06/2016. This condition would be considered of mild to moderate severity with no modifying factors or associated signs or symptoms other than as noted above.   11/08/16: He presented to the emergency room on 11/06/16 with gross hematuria and was associated with clots but was not associated with any flank pain. He describes it was noting grossly bloody urine dripping from his penis before he is wife began to engage in sexual intercourse. He said he saw a red blood dripping from the penis and went into the bathroom and continued to have blood dripped from the tip of his penis until he was able to apply some pressure to the perineal region with a towel. He reports having passed a stone about 15-20 years ago. He was found to have a normal serum calcium of 9.3 with a normal creatinine of 1.0 and urinalysis that was unremarkable other than for blood. A CT scan with and without contrast was obtained which revealed bilateral, nonobstructing renal calculi measuring 4 mm and a right renal cyst.  He has seen some clots since that time but has not experienced any grossly bloody urine although in the emergency room when he urinated the first time he said the urine didn't seem to be blood tinged as well.    CC: Lower urinary tract symptoms  HPI: The patient complains of lower urinary tract symptom(s) that include frequency, urgency, and nocturia. His symptoms did begin gradually. He gets up at night to urinate 1-2 times. He does have urgency. This condition would be considered of mild to moderate severity  with no modifying factors or associated signs or symptoms other than as noted above.   11/08/16: For some time he has had difficulty with frequency, urgency and occasional episodes of urge incontinence.   -12/05/19-patient with history of hematuria with essentially negative workup back in September of 2018 with cysto and CT scan showing small nonobstructing stone and right renal cyst. Patient presents today having had a recent PSA drawn at PCP office and noted to be elevated at 4.09 on 10 19 2021. Previous values were 3.41 on 10/ 20 and 2.57 on 11/08/2016. Patient has minimal if any voiding issues. There is no family history of prostate cancer. Here for evaluation of elevated PSA  -06/10/20-patient with history of hematuria with negative evaluation in September of 2018 as above. Also history of marginal PSA elevation up to 4.09 in October 2021. Elected for PSA surveillance. Most recent PSA is 4.08 on 05/29/2020. No GU issues.  Micro urinalysis is clear on urine spun sediment  -12/09/20-hematuria negative work-up back in September 2018 with cystoscopy and CT scan which showed a small nonobstructing stone and a right renal cyst. Also had marginal PSA elevation 08 back on 05/29/2020 and we opted for surveillance. Most recent PSA is now up to 6.42 on 12/02/2020 here to discuss neck steps of management.  Micro urinalysis today is clear and urine spun sediment  -01/08/21-patient with history of microhematuria and recently noted to have elevated PSA  up to 6.42, now presents to undergo transrectal ultrasound and prostate biopsy   -01/14/21-patient with history of elevated PSA underwent transrectal ultrasound prostate biopsy on 01/08/2021. Results did show adenocarcinoma the prostate in 5 out of 12 cores. No postprocedural issues. Here to discuss results  TRUS/Bx 01/08/21  Path:Adenocarcinoma in 5/12 cores, Gleason 3+3 =6 in 3 cores(10% in all 3), Gleason 3+4 =7 in 2 cores(40%,10%)  Partin tables : OC67- 87%, EPE  12-28%, SV 1-4%, LN 0-1%  Lengthy discussion regarding treatment options for prostate cancer including radiation with external beam radiation versus brachytherapy versus robotic prostatectomy. Patient does not want to do observation or active surveillance.   03/04/2021: 71 year old male who is scheduled to undergo brachii seed placement with SpaceOAR on 03/30/2021 presents today for preoperative appointment. He has no questions or concerns at this time. He denies any interval changes to his current medical history. He denies chest pain and shortness of breath. He denies fevers and chills.     ALLERGIES: Actos Water - redness and itching on skin    MEDICATIONS: Levofloxacin 750 mg tablet Take 1 tab po 1 hour prior to procedure  Lisinopril  Metformin Hcl  Simvastatin  Aspir 81  Cetirizine Hcl  Farxiga  Insulin Syringe  Sildenafil Citrate 20 mg tablet     GU PSH: Cystoscopy - 2018 Prostate Needle Biopsy - 01/08/2021       PSH Notes: R hand   NON-GU PSH: Cataract surgery - 12/05/2018 Surgical Pathology, Gross And Microscopic Examination For Prostate Needle - 01/08/2021     GU PMH: Prostate Cancer - 01/14/2021 Elevated PSA - 01/08/2021, - 12/09/2020, - 06/10/2020, - 12/05/2019 BPH w/o LUTS - 12/09/2020, - 06/10/2020, - 12/05/2019 Gross hematuria - 06/10/2020, (Acute), I identified the source of his gross hematuria as a small tear in the urethral mucosa at about the 11 o'clock position in the fossa navicularis just inside the urethral meatus., - 2018 Overactive bladder, He seems to have a long-standing history of bladder overactivity. Since he has experienced urgency with some urge incontinence when I recommended is a trial of Myrbetriq 25 mg. - 2018 Renal calculus, Bilateral, While he does have bilateral renal calculi he had no ureteral stones on his CT scan. We did go over the fact that renal calculi would typically not result in gross hematuria. - 2018 Renal cyst, Simple cyst of the right  kidney were identified on his CT scan. We discussed the fact that these are of no clinical significance. - 2018    NON-GU PMH: Decreased libido, Decreased libido - 2015 Other fatigue, Decreased energy - 2015 Arthritis Cardiac murmur, unspecified Diabetes Type 2 Encounter for general adult medical examination without abnormal findings, Encounter for preventive health examination GERD    FAMILY HISTORY: 1 Daughter - Daughter 1 son - Son Cancer - Father Diabetes - Father father deceased - Other Heart Disease - Other, Mother Lung Cancer - Father mother deceased - Other   SOCIAL HISTORY: Marital Status: Married Preferred Language: English; Ethnicity: ; Race: White Current Smoking Status: Patient has never smoked.   Tobacco Use Assessment Completed: Used Tobacco in last 30 days? Social Drinker.  Drinks 2 caffeinated drinks per day.    REVIEW OF SYSTEMS:    GU Review Male:   Patient denies frequent urination, hard to postpone urination, burning/ pain with urination, get up at night to urinate, leakage of urine, stream starts and stops, trouble starting your stream, have to strain to urinate , erection problems, and penile pain.  Gastrointestinal (Upper):   Patient denies nausea, vomiting, and indigestion/ heartburn.  Gastrointestinal (Lower):   Patient denies diarrhea and constipation.  Constitutional:   Patient denies fever, night sweats, weight loss, and fatigue.  Skin:   Patient denies skin rash/ lesion and itching.  Musculoskeletal:   Patient denies back pain and joint pain.  Neurological:   Patient denies headaches and dizziness.  Psychologic:   Patient denies depression and anxiety.   VITAL SIGNS:      03/04/2021 11:18 AM  Weight 219 lb / 99.34 kg  Height 70 in / 177.8 cm  BP 127/75 mmHg  Pulse 73 /min  Temperature 97.9 F / 36.6 C  BMI 31.4 kg/m   MULTI-SYSTEM PHYSICAL EXAMINATION:    Constitutional: Well-nourished. No physical deformities. Normally developed. Good  grooming.  Neck: Neck symmetrical, not swollen. Normal tracheal position.  Respiratory: Normal breath sounds. No labored breathing, no use of accessory muscles.   Cardiovascular: Regular rate and rhythm. No murmur, no gallop. Normal temperature, normal extremity pulses, no swelling, no varicosities.   Lymphatic: No enlargement of neck, axillae, groin.  Skin: No paleness, no jaundice, no cyanosis. No lesion, no ulcer, no rash.  Neurologic / Psychiatric: Oriented to time, oriented to place, oriented to person. No depression, no anxiety, no agitation.  Gastrointestinal: No mass, no tenderness, no rigidity, non obese abdomen.   Musculoskeletal: Normal gait and station of head and neck.     Complexity of Data:  Source Of History:  Patient  Lab Test Review:   PSA  Records Review:   Previous Doctor Records, Previous Patient Records  Urine Test Review:   Urinalysis   12/02/20 05/29/20 11/08/16  PSA  Total PSA 6.42 ng/mL 4.08 ng/mL 2.57 ng/mL  Free PSA 0.51 ng/mL 0.31 ng/mL   % Free PSA 8 % PSA 8 % PSA     03/04/21  Urinalysis  Urine Appearance Clear   Urine Color Yellow   Urine Glucose 3+ mg/dL  Urine Bilirubin Neg mg/dL  Urine Ketones Neg mg/dL  Urine Specific Gravity 1.020   Urine Blood Neg ery/uL  Urine pH 6.0   Urine Protein Neg mg/dL  Urine Urobilinogen 0.2 mg/dL  Urine Nitrites Neg   Urine Leukocyte Esterase Neg leu/uL   PROCEDURES:          Urinalysis Dipstick Dipstick Cont'd  Color: Yellow Bilirubin: Neg mg/dL  Appearance: Clear Ketones: Neg mg/dL  Specific Gravity: 1.020 Blood: Neg ery/uL  pH: 6.0 Protein: Neg mg/dL  Glucose: 3+ mg/dL Urobilinogen: 0.2 mg/dL    Nitrites: Neg    Leukocyte Esterase: Neg leu/uL    ASSESSMENT:      ICD-10 Details  1 GU:   Prostate Cancer - C61 Chronic, Stable   PLAN:           Document Letter(s):  Created for Patient: Clinical Summary         Notes:   All of the patient's actions were answered to the best my ability today. He  understands preoperative instructions. He will keep his upcoming preoperative appointments at the hospital. He will proceed with brachytherapy and SpaceOAR on 03/30/2021. He under stands to notify our clinic with any changes to his health history. Follow-up in 3-4 months. May return sooner with any concerns.

## 2021-03-30 ENCOUNTER — Encounter (HOSPITAL_BASED_OUTPATIENT_CLINIC_OR_DEPARTMENT_OTHER)
Admission: RE | Disposition: A | Discharge status: Home / Self Care | Payer: Self-pay | Source: Ambulatory Visit | Attending: Urology

## 2021-03-30 ENCOUNTER — Ambulatory Visit (HOSPITAL_BASED_OUTPATIENT_CLINIC_OR_DEPARTMENT_OTHER)
Admission: RE | Admit: 2021-03-30 | Discharge: 2021-03-30 | Disposition: A | Discharge status: Home / Self Care | Payer: Medicare PPO | Source: Ambulatory Visit | Attending: Urology | Admitting: Urology

## 2021-03-30 ENCOUNTER — Encounter (HOSPITAL_BASED_OUTPATIENT_CLINIC_OR_DEPARTMENT_OTHER): Payer: Self-pay | Admitting: Urology

## 2021-03-30 ENCOUNTER — Ambulatory Visit (HOSPITAL_COMMUNITY): Payer: Medicare PPO

## 2021-03-30 ENCOUNTER — Ambulatory Visit (HOSPITAL_BASED_OUTPATIENT_CLINIC_OR_DEPARTMENT_OTHER): Payer: Medicare PPO | Admitting: Anesthesiology

## 2021-03-30 ENCOUNTER — Other Ambulatory Visit: Payer: Self-pay

## 2021-03-30 DIAGNOSIS — Z79899 Other long term (current) drug therapy: Secondary | ICD-10-CM | POA: Insufficient documentation

## 2021-03-30 DIAGNOSIS — Z87891 Personal history of nicotine dependence: Secondary | ICD-10-CM | POA: Diagnosis not present

## 2021-03-30 DIAGNOSIS — I10 Essential (primary) hypertension: Secondary | ICD-10-CM | POA: Insufficient documentation

## 2021-03-30 DIAGNOSIS — C61 Malignant neoplasm of prostate: Secondary | ICD-10-CM

## 2021-03-30 DIAGNOSIS — E119 Type 2 diabetes mellitus without complications: Secondary | ICD-10-CM | POA: Insufficient documentation

## 2021-03-30 DIAGNOSIS — Z87442 Personal history of urinary calculi: Secondary | ICD-10-CM | POA: Diagnosis not present

## 2021-03-30 HISTORY — PX: RADIOACTIVE SEED IMPLANT: SHX5150

## 2021-03-30 HISTORY — DX: Malignant (primary) neoplasm, unspecified: C80.1

## 2021-03-30 HISTORY — DX: Disorder of the skin and subcutaneous tissue, unspecified: L98.9

## 2021-03-30 HISTORY — PX: SPACE OAR INSTILLATION: SHX6769

## 2021-03-30 HISTORY — DX: Other complications of anesthesia, initial encounter: T88.59XA

## 2021-03-30 HISTORY — PX: CYSTOSCOPY: SHX5120

## 2021-03-30 HISTORY — DX: Presence of spectacles and contact lenses: Z97.3

## 2021-03-30 LAB — POCT I-STAT, CHEM 8
BUN: 20 mg/dL (ref 8–23)
Calcium, Ion: 1.21 mmol/L (ref 1.15–1.40)
Chloride: 106 mmol/L (ref 98–111)
Creatinine, Ser: 0.8 mg/dL (ref 0.61–1.24)
Glucose, Bld: 106 mg/dL — ABNORMAL HIGH (ref 70–99)
HCT: 50 % (ref 39.0–52.0)
Hemoglobin: 17 g/dL (ref 13.0–17.0)
Potassium: 4.3 mmol/L (ref 3.5–5.1)
Sodium: 142 mmol/L (ref 135–145)
TCO2: 27 mmol/L (ref 22–32)

## 2021-03-30 LAB — GLUCOSE, CAPILLARY: Glucose-Capillary: 103 mg/dL — ABNORMAL HIGH (ref 70–99)

## 2021-03-30 SURGERY — INSERTION, RADIATION SOURCE, PROSTATE
Anesthesia: General | Site: Prostate

## 2021-03-30 MED ORDER — STERILE WATER FOR INJECTION IJ SOLN
INTRAMUSCULAR | Status: DC | PRN
  Administered 2021-03-30: 3 mL

## 2021-03-30 MED ORDER — FENTANYL CITRATE (PF) 100 MCG/2ML IJ SOLN
INTRAMUSCULAR | Status: DC | PRN
  Administered 2021-03-30 (×2): 25 ug via INTRAVENOUS
  Administered 2021-03-30: 50 ug via INTRAVENOUS

## 2021-03-30 MED ORDER — KETOROLAC TROMETHAMINE 30 MG/ML IJ SOLN: 30.0000 mg | Freq: Once | INTRAMUSCULAR | Status: DC | PRN

## 2021-03-30 MED ORDER — FENTANYL CITRATE (PF) 100 MCG/2ML IJ SOLN
INTRAMUSCULAR | Status: AC
  Filled 2021-03-30: qty 2

## 2021-03-30 MED ORDER — OXYCODONE HCL 5 MG/5ML PO SOLN: 5.0000 mg | Freq: Once | ORAL | Status: DC | PRN

## 2021-03-30 MED ORDER — ONDANSETRON HCL 4 MG/2ML IJ SOLN
INTRAMUSCULAR | Status: AC
  Filled 2021-03-30: qty 2

## 2021-03-30 MED ORDER — CEFAZOLIN SODIUM-DEXTROSE 2-4 GM/100ML-% IV SOLN
2.0000 g | Freq: Once | INTRAVENOUS | Status: AC
  Administered 2021-03-30: 2 g via INTRAVENOUS

## 2021-03-30 MED ORDER — LIDOCAINE 2% (20 MG/ML) 5 ML SYRINGE
INTRAMUSCULAR | Status: DC | PRN
Start: 2021-03-30 — End: 2021-03-30
  Administered 2021-03-30: 100 mg via INTRAVENOUS

## 2021-03-30 MED ORDER — CEFAZOLIN SODIUM-DEXTROSE 2-4 GM/100ML-% IV SOLN
INTRAVENOUS | Status: AC
  Filled 2021-03-30: qty 100

## 2021-03-30 MED ORDER — ROCURONIUM BROMIDE 10 MG/ML (PF) SYRINGE
PREFILLED_SYRINGE | INTRAVENOUS | Status: DC | PRN
Start: 2021-03-30 — End: 2021-03-30
  Administered 2021-03-30: 70 mg via INTRAVENOUS

## 2021-03-30 MED ORDER — PROPOFOL 10 MG/ML IV BOLUS
INTRAVENOUS | Status: DC | PRN
  Administered 2021-03-30: 200 mg via INTRAVENOUS

## 2021-03-30 MED ORDER — OXYCODONE HCL 5 MG PO TABS: 5.0000 mg | ORAL_TABLET | Freq: Once | ORAL | Status: DC | PRN

## 2021-03-30 MED ORDER — FLEET ENEMA 7-19 GM/118ML RE ENEM: 1.0000 | ENEMA | Freq: Once | RECTAL | Status: DC

## 2021-03-30 MED ORDER — IOHEXOL 300 MG/ML  SOLN
INTRAMUSCULAR | Status: DC | PRN
  Administered 2021-03-30: 7 mL

## 2021-03-30 MED ORDER — MIDAZOLAM HCL 2 MG/2ML IJ SOLN
INTRAMUSCULAR | Status: AC
  Filled 2021-03-30: qty 2

## 2021-03-30 MED ORDER — LIDOCAINE HCL (PF) 2 % IJ SOLN
INTRAMUSCULAR | Status: AC
  Filled 2021-03-30: qty 5

## 2021-03-30 MED ORDER — FENTANYL CITRATE (PF) 100 MCG/2ML IJ SOLN
25.0000 ug | INTRAMUSCULAR | Status: DC | PRN
  Administered 2021-03-30: 25 ug via INTRAVENOUS
  Administered 2021-03-30: 50 ug via INTRAVENOUS

## 2021-03-30 MED ORDER — LACTATED RINGERS IV SOLN: INTRAVENOUS | Status: DC

## 2021-03-30 MED ORDER — SUGAMMADEX SODIUM 200 MG/2ML IV SOLN
INTRAVENOUS | Status: DC | PRN
Start: 2021-03-30 — End: 2021-03-30
  Administered 2021-03-30: 200 mg via INTRAVENOUS

## 2021-03-30 MED ORDER — SODIUM CHLORIDE (PF) 0.9 % IJ SOLN
INTRAMUSCULAR | Status: DC | PRN
Start: 2021-03-30 — End: 2021-03-30
  Administered 2021-03-30: 10 mL via INTRAVENOUS

## 2021-03-30 MED ORDER — MIDAZOLAM HCL 2 MG/2ML IJ SOLN
INTRAMUSCULAR | Status: DC | PRN
Start: 2021-03-30 — End: 2021-03-30
  Administered 2021-03-30: 1 mg via INTRAVENOUS

## 2021-03-30 MED ORDER — SODIUM CHLORIDE 0.9 % IR SOLN
Status: DC | PRN
Start: 2021-03-30 — End: 2021-03-30
  Administered 2021-03-30: 125 mL

## 2021-03-30 MED ORDER — ROCURONIUM BROMIDE 10 MG/ML (PF) SYRINGE
PREFILLED_SYRINGE | INTRAVENOUS | Status: AC
  Filled 2021-03-30: qty 10

## 2021-03-30 MED ORDER — ONDANSETRON HCL 4 MG/2ML IJ SOLN: 4.0000 mg | Freq: Once | INTRAMUSCULAR | Status: DC | PRN

## 2021-03-30 MED ORDER — ONDANSETRON HCL 4 MG/2ML IJ SOLN
INTRAMUSCULAR | Status: DC | PRN
  Administered 2021-03-30: 4 mg via INTRAVENOUS

## 2021-03-30 MED ORDER — TRAMADOL HCL 50 MG PO TABS
50.0000 mg | ORAL_TABLET | Freq: Four times a day (QID) | ORAL | 0 refills | Status: DC | PRN
Start: 2021-03-30 — End: 2021-05-10

## 2021-03-30 SURGICAL SUPPLY — 43 items
BAG DRN RND TRDRP ANRFLXCHMBR (UROLOGICAL SUPPLIES) ×2
BAG URINE DRAIN 2000ML AR STRL (UROLOGICAL SUPPLIES) ×3 IMPLANT
BLADE CLIPPER SENSICLIP SURGIC (BLADE) ×3 IMPLANT
Bard QuickLink Cartidges with BrachySource I-125 S ×69 IMPLANT
CATH FOLEY 2WAY SLVR  5CC 16FR (CATHETERS) ×3
CATH FOLEY 2WAY SLVR 5CC 16FR (CATHETERS) ×4 IMPLANT
CATH ROBINSON RED A/P 16FR (CATHETERS) IMPLANT
CATH ROBINSON RED A/P 20FR (CATHETERS) ×3 IMPLANT
CLOTH BEACON ORANGE TIMEOUT ST (SAFETY) ×3 IMPLANT
COVER BACK TABLE 60X90IN (DRAPES) ×3 IMPLANT
COVER MAYO STAND STRL (DRAPES) ×3 IMPLANT
DRSG TEGADERM 4X4.75 (GAUZE/BANDAGES/DRESSINGS) ×5 IMPLANT
DRSG TEGADERM 8X12 (GAUZE/BANDAGES/DRESSINGS) ×6 IMPLANT
GAUZE SPONGE 4X4 12PLY STRL LF (GAUZE/BANDAGES/DRESSINGS) ×1 IMPLANT
GEL ULTRASOUND 20GR AQUASONIC (MISCELLANEOUS) ×3 IMPLANT
GLOVE SURG ENC MOIS LTX SZ6.5 (GLOVE) ×3 IMPLANT
GLOVE SURG ENC MOIS LTX SZ7.5 (GLOVE) ×3 IMPLANT
GLOVE SURG ORTHO LTX SZ8.5 (GLOVE) ×3 IMPLANT
GLOVE SURG UNDER POLY LF SZ6.5 (GLOVE) IMPLANT
GOWN STRL REUS W/TWL XL LVL3 (GOWN DISPOSABLE) ×3 IMPLANT
GRID BRACH TEMP 18GA 2.8X3X.75 (MISCELLANEOUS) ×3 IMPLANT
HOLDER FOLEY CATH W/STRAP (MISCELLANEOUS) ×2 IMPLANT
IMPL SPACEOAR VUE SYSTEM (Spacer) ×2 IMPLANT
IMPLANT SPACEOAR VUE SYSTEM (Spacer) ×3 IMPLANT
IV NS 1000ML (IV SOLUTION) ×3
IV NS 1000ML BAXH (IV SOLUTION) ×2 IMPLANT
KIT TURNOVER CYSTO (KITS) ×3 IMPLANT
NDL BRACHY 18G 5PK (NEEDLE) ×8 IMPLANT
NDL BRACHY 18G SINGLE (NEEDLE) IMPLANT
NDL PK MORGANSTERN STABILIZ (NEEDLE) ×2 IMPLANT
NEEDLE BRACHY 18G 5PK (NEEDLE) IMPLANT
NEEDLE BRACHY 18G SINGLE (NEEDLE) IMPLANT
NEEDLE PK MORGANSTERN STABILIZ (NEEDLE) ×3 IMPLANT
PACK CYSTO (CUSTOM PROCEDURE TRAY) ×3 IMPLANT
SHEATH ULTRASOUND LF (SHEATH) IMPLANT
SHEATH ULTRASOUND LTX NONSTRL (SHEATH) ×1 IMPLANT
SURGILUBE 2OZ TUBE FLIPTOP (MISCELLANEOUS) ×3 IMPLANT
SUT BONE WAX W31G (SUTURE) IMPLANT
SYR 10ML LL (SYRINGE) ×6 IMPLANT
SYR 20ML LL LF (SYRINGE) IMPLANT
TOWEL OR 17X26 10 PK STRL BLUE (TOWEL DISPOSABLE) ×3 IMPLANT
UNDERPAD 30X36 HEAVY ABSORB (UNDERPADS AND DIAPERS) ×6 IMPLANT
WATER STERILE IRR 500ML POUR (IV SOLUTION) ×3 IMPLANT

## 2021-03-30 NOTE — Discharge Instructions (Addendum)
Post Anesthesia Home Care Instructions  Activity: Get plenty of rest for the remainder of the day. A responsible individual must stay with you for 24 hours following the procedure.  For the next 24 hours, DO NOT: -Drive a car -Paediatric nurse -Drink alcoholic beverages -Take any medication unless instructed by your physician -Make any legal decisions or sign important papers.  Meals: Start with liquid foods such as gelatin or soup. Progress to regular foods as tolerated. Avoid greasy, spicy, heavy foods. If nausea and/or vomiting occur, drink only clear liquids until the nausea and/or vomiting subsides. Call your physician if vomiting continues.  Special Instructions/Symptoms: Your throat may feel dry or sore from the anesthesia or the breathing tube placed in your throat during surgery. If this causes discomfort, gargle with warm salt water. The discomfort should disappear within 24 hours.         Transurethral Procedure  Medications: Resume all your other meds from home  Activity: 1. No heavy lifting > 10 pounds for 2 weeks. 2. No sexual activity for 2 weeks. 3. No strenuous activity for 2 weeks. 4. No driving while on narcotic pain medications. 5. Drink plenty of water. 6. Continue to walk at home - you can still get blood clots when you are at home so keep     active but don't over do it. 7. Your urine may have some blood in it - make sure you drink plenty of water. Call or           come to the ER immediately if you catheter stops draining or you are unable to urinate.  Bathing: You can shower. You can take a bath unless you have a foley catheter in place.  Signs / Symptoms to call: 1. Call if you have a fever greater than 101.5  2. Uncontrolled nausea / vomiting, uncontrolled pain / dizziness, unable to urinate, leg         swelling / leg pain, or any other concerns.   You can reach [place] at [phone] Radioactive Seed  Implant Home Care Instructions   Activity:    Rest for the remainder of the day.  Do not drive or operate equipment today.  You may resume normal  activities in a few days as instructed by your physician, without risk of harmful radiation exposure to those around you, provided you follow the time and distance precautions on the Radiation Oncology Instruction Sheet.   Meals: Drink plenty of lipuids and eat light foods, such as gelatin or soup this evening .  You may return to normal meal plan tomorrow.  Return To Work: You may return to work as instructed by Naval architect.  Special Instruction:   If any seeds are found, use tweezers to pick up seeds and place in a glass container of any kind and bring to your physician's office.  Call your physician if any of these symptoms occur:  Persistent or heavy bleeding Urine stream diminishes or stops completely after catheter is removed Fever equal to or greater than 101 degrees F Cloudy urine with a strong foul odor Severe pain  You may feel some burning pain and/or hesitancy when you urinate after the catheter is removed.  These symptoms may increase over the next few weeks, but should diminish within forur to six weeks.  Applying moist heat to the lower abdomen or a hot tub bath may help relieve the pain.  If the discomfort becomes severe, please call your physician for additional medications.

## 2021-03-30 NOTE — Progress Notes (Signed)
°  Radiation Oncology         [phone] ________________________________  Name: irie fiorello MRN: [id number]  Date: 03/30/2021  DOB: [birth year]       Prostate Seed Implant  JE:HUDJSH, Theophilus Kinds, MD  No ref. provider found  DIAGNOSIS: 71 y.o. gentleman with Stage T1c adenocarcinoma of the prostate with Gleason score of 3+4, and PSA of 6.42 Oncology History  Malignant neoplasm of prostate ([place])  01/08/2021 Cancer Staging   Staging form: Prostate, AJCC 8th Edition - Clinical stage from 01/08/2021: Stage IIB (cT1c, cN0, cM0, PSA: 6.4, Grade Group: 2) - Signed by Freeman Caldron, PA-C on 01/19/2021 Histopathologic type: Adenocarcinoma, NOS Stage prefix: Initial diagnosis Prostate specific antigen (PSA) range: Less than 10 Gleason primary pattern: 3 Gleason secondary pattern: 4 Gleason score: 7 Histologic grading system: 5 grade system Number of biopsy cores examined: 12 Number of biopsy cores positive: 5 Location of positive needle core biopsies: One side    01/19/2021 Initial Diagnosis   Malignant neoplasm of prostate ([place])     No diagnosis found.  PROCEDURE: Insertion of radioactive I-125 seeds into the prostate gland.  RADIATION DOSE: 145 Gy, definitive therapy.  TECHNIQUE: sylvan sookdeo was brought to the operating room with the urologist. He was placed in the dorsolithotomy position. He was catheterized and a rectal tube was inserted. The perineum was shaved, prepped and draped. The ultrasound probe was then introduced into the rectum to see the prostate gland.  TREATMENT DEVICE: A needle grid was attached to the ultrasound probe stand and anchor needles were placed.  3D PLANNING: The prostate was imaged in 3D using a sagittal sweep of the prostate probe. These images were transferred to the planning computer. There, the prostate, urethra and rectum were defined on each axial reconstructed image. Then, the software created an optimized 3D plan and a few seed  positions were adjusted. The quality of the plan was reviewed using [facility] information for the target and the following two organs at risk:  Urethra and Rectum.  Then the accepted plan was printed and handed off to the radiation therapist.  Under my supervision, the custom loading of the seeds and spacers was carried out and loaded into sealed vicryl sleeves.  These pre-loaded needles were then placed into the needle holder.Marland Kitchen  PROSTATE VOLUME STUDY:  Using transrectal ultrasound the volume of the prostate was verified to be 55.1 cc.  SPECIAL TREATMENT PROCEDURE/SUPERVISION AND HANDLING: The pre-loaded needles were then delivered under sagittal guidance. A total of 16 needles were used to deposit 69 seeds in the prostate gland. The individual seed activity was 0.519 mCi.  SpaceOAR:  Yes  COMPLEX SIMULATION: At the end of the procedure, an anterior radiograph of the pelvis was obtained to document seed positioning and count. Cystoscopy was performed to check the urethra and bladder.  MICRODOSIMETRY: At the end of the procedure, the patient was emitting 0.098 mR/hr at 1 meter. Accordingly, he was considered safe for hospital discharge.  PLAN: The patient will return to the radiation oncology clinic for post implant CT dosimetry in three weeks.   ________________________________  Sheral Apley Tammi Klippel, M.D.

## 2021-03-30 NOTE — Transfer of Care (Signed)
Immediate Anesthesia Transfer of Care Note  Patient: Aaron Zimmerman  Procedure(s) Performed: Procedure(s) (LRB): RADIOACTIVE SEED IMPLANT/BRACHYTHERAPY IMPLANT (N/A) SPACE OAR INSTILLATION (N/A) CYSTOSCOPY FLEXIBLE (N/A)  Patient Location: PACU  Anesthesia Type: General  Level of Consciousness: awake, oriented, sedated and patient cooperative  Airway & Oxygen Therapy: Patient Spontanous Breathing and Patient connected to face mask oxygen  Post-op Assessment: Report given to PACU RN and Post -op Vital signs reviewed and stable  Post vital signs: Reviewed and stable  Complications: No apparent anesthesia complications Last Vitals:  Vitals Value Taken Time  BP 169/86 03/30/21 1432  Temp    Pulse 76 03/30/21 1436  Resp 15 03/30/21 1436  SpO2 95 % 03/30/21 1436  Vitals shown include unvalidated device data.  Last Pain:  Vitals:   03/30/21 1133  TempSrc: Oral  PainSc: 0-No pain      Patients Stated Pain Goal: 5 ([id number])  Complications: No notable events documented.

## 2021-03-30 NOTE — Op Note (Signed)
Preoperative diagnosis: Clinically localized adenocarcinoma of the prostate (stage T1c)  Postoperative diagnosis: Clinically localized adenocarcinoma of the prostate  Procedure: 1) Transperineal placement of radioactive seeds into the prostate                    2) Cystoscopy                    3) Insertion of SpaceOAR hydrogel   Surgeon: Dr Harold Barban  Radiation oncologist: Dr. Tammi Klippel  Anesthesia: General  EBL: Minimal  Complications: None  Indication: Aaron Zimmerman is a 71 y.o. gentleman with clinically localized prostate cancer. After discussing management options for treatment, he elected to proceed with radiotherapy. He presents today for the above procedures. The potential risks, complications, alternative options, and expected recovery course have been discussed in detail with the patient and he has provided informed consent to proceed.  Description of procedure: The patient was taken to the operating room and general anesthesia was induced. He was administered preoperative antibiotics, placed in the dorsal lithotomy position, and prepped and draped in the usual sterile fashion. Next, intraoperative transrectal ultrasonography was utilized for real-time intraoperative planning by the radiation oncology team. Once the treatment plan was completed and the seed strands created, stranded iodine 125 radiation seeds were placed utilizing a brachytherapy perineal template. 70 radioactive iodine 125 seeds into the prostate through 16 catheter needles.  The brachytherapy template was then removed.  A site in the midline was selected on the perineum for placement of an 18 g needle with saline.  The needle was advanced above the rectum and below Denonvillier's fascia to the mid gland and confirmed to be in the midline on transverse imaging.  One cc of saline was injected confirming appropriate expansion of this space.  A total of 5 cc of saline was then injected to open the space further  bilaterally.  The saline syringe was then removed and the SpaceOAR hydrogel was injected with good distribution bilaterally. Position of the radiation seeds was confirmed on fluoroscopic imaging.  Flexible cystoscopy was then performed and no seeds were identified within the bladder.  No bladder tumors, stones, or other mucosal pathology was identified within the bladder. He tolerated the procedure well and without complications. He was able to be transferred to the recovery unit in satisfactory condition.  He was given a voiding trial in the PACU.

## 2021-03-30 NOTE — Interval H&P Note (Signed)
History and Physical Interval Note:  03/30/2021 11:11 AM  Aaron Zimmerman  has presented today for surgery, with the diagnosis of PROSTATE CANCER.  The various methods of treatment have been discussed with the patient and family. After consideration of risks, benefits and other options for treatment, the patient has consented to  Procedure(s): RADIOACTIVE SEED IMPLANT/BRACHYTHERAPY IMPLANT (N/A) SPACE OAR INSTILLATION (N/A) as a surgical intervention.  The patient's history has been reviewed, patient examined, no change in status, stable for surgery.  I have reviewed the patient's chart and labs.  Questions were answered to the patient's satisfaction.     Remi Haggard

## 2021-03-30 NOTE — Anesthesia Procedure Notes (Signed)
Procedure Name: Intubation Date/Time: 03/30/2021 1:26 PM Performed by: Suan Halter, CRNA Pre-anesthesia Checklist: Patient identified, Emergency Drugs available, Suction available and Patient being monitored Patient Re-evaluated:Patient Re-evaluated prior to induction Oxygen Delivery Method: Circle system utilized Preoxygenation: Pre-oxygenation with 100% oxygen Induction Type: IV induction Ventilation: Mask ventilation without difficulty Laryngoscope Size: Mac and 4 Grade View: Grade I Tube type: Oral Tube size: 7.5 mm Number of attempts: 1 Airway Equipment and Method: Stylet and Oral airway Placement Confirmation: ETT inserted through vocal cords under direct vision, positive ETCO2 and breath sounds checked- equal and bilateral Secured at: 22 cm Tube secured with: Tape Dental Injury: Teeth and Oropharynx as per pre-operative assessment

## 2021-03-30 NOTE — Anesthesia Preprocedure Evaluation (Signed)
Anesthesia Evaluation  Patient identified by MRN, date of birth, ID band Patient awake    Reviewed: Allergy & Precautions, NPO status , Patient's Chart, lab work & pertinent test results  Airway Mallampati: II  TM Distance: >3 FB Neck ROM: Full    Dental no notable dental hx.    Pulmonary neg pulmonary ROS,    Pulmonary exam normal breath sounds clear to auscultation       Cardiovascular hypertension, Pt. on medications Normal cardiovascular exam Rhythm:Regular Rate:Normal     Neuro/Psych negative neurological ROS  negative psych ROS   GI/Hepatic negative GI ROS, Neg liver ROS,   Endo/Other  diabetes, Type 2  Renal/GU negative Renal ROS  negative genitourinary   Musculoskeletal negative musculoskeletal ROS (+)   Abdominal   Peds negative pediatric ROS (+)  Hematology negative hematology ROS (+)   Anesthesia Other Findings   Reproductive/Obstetrics negative OB ROS                             Anesthesia Physical Anesthesia Plan  ASA: 2  Anesthesia Plan: General   Post-op Pain Management: Minimal or no pain anticipated   Induction: Intravenous  PONV Risk Score and Plan: 3 and Ondansetron, Dexamethasone and Treatment may vary due to age or medical condition  Airway Management Planned: Oral ETT  Additional Equipment:   Intra-op Plan:   Post-operative Plan: Extubation in OR  Informed Consent: I have reviewed the patients History and Physical, chart, labs and discussed the procedure including the risks, benefits and alternatives for the proposed anesthesia with the patient or authorized representative who has indicated his/her understanding and acceptance.     Dental advisory given  Plan Discussed with: CRNA and Surgeon  Anesthesia Plan Comments:         Anesthesia Quick Evaluation

## 2021-03-31 ENCOUNTER — Encounter (HOSPITAL_BASED_OUTPATIENT_CLINIC_OR_DEPARTMENT_OTHER): Payer: Self-pay | Admitting: Urology

## 2021-03-31 NOTE — Anesthesia Postprocedure Evaluation (Signed)
Anesthesia Post Note  Patient: Aaron Zimmerman  Procedure(s) Performed: RADIOACTIVE SEED IMPLANT/BRACHYTHERAPY IMPLANT (Prostate) SPACE OAR INSTILLATION (Prostate) CYSTOSCOPY FLEXIBLE (Bladder)     Patient location during evaluation: PACU Anesthesia Type: General Level of consciousness: awake and alert Pain management: pain level controlled Vital Signs Assessment: post-procedure vital signs reviewed and stable Respiratory status: spontaneous breathing, nonlabored ventilation, respiratory function stable and patient connected to nasal cannula oxygen Cardiovascular status: blood pressure returned to baseline and stable Postop Assessment: no apparent nausea or vomiting Anesthetic complications: no   No notable events documented.  Last Vitals:  Vitals:   03/30/21 1506 03/30/21 1545  BP: (!) 149/71 (!) 151/68  Pulse: 70 75  Resp: 15 16  Temp:  (!) 36.4 C  SpO2: 91% 94%    Last Pain:  Vitals:   03/30/21 1600  TempSrc:   PainSc: 3                  Demarus Latterell S

## 2021-04-16 ENCOUNTER — Telehealth: Payer: Medicare PPO | Admitting: Family

## 2021-04-16 ENCOUNTER — Encounter: Payer: Self-pay | Admitting: Family

## 2021-04-16 VITALS — BP 126/64 | HR 90 | Temp 98.6°F | Ht 70.0 in | Wt 212.0 lb

## 2021-04-16 DIAGNOSIS — R509 Fever, unspecified: Secondary | ICD-10-CM | POA: Diagnosis not present

## 2021-04-16 DIAGNOSIS — J069 Acute upper respiratory infection, unspecified: Secondary | ICD-10-CM | POA: Diagnosis not present

## 2021-04-16 LAB — POC INFLUENZA A&B (BINAX/QUICKVUE): Influenza A, POC: NEGATIVE

## 2021-04-16 NOTE — Assessment & Plan Note (Signed)
Testing for rapid covid and flu to r/o viral ?

## 2021-04-16 NOTE — Progress Notes (Signed)
Please let pt know flu was negative.  ?Tell him we will treat this as viral for now, If any worsening sx over weekend or high fever unable to control please have him to go ER/uc. If no improvement by Monday but  stable symptoms have pt call Monday to be seen in person.

## 2021-04-16 NOTE — Progress Notes (Signed)
MyChart Video Visit    Virtual Visit via Video Note   This visit type was conducted due to national recommendations for restrictions regarding the COVID-19 Pandemic (e.g. social distancing) in an effort to limit this patient's exposure and mitigate transmission in our community. This patient is at least at moderate risk for complications without adequate follow up. This format is felt to be most appropriate for this patient at this time. Physical exam was limited by quality of the video and audio technology used for the visit. CMA was able to get the patient set up on a video visit.  Patient location: Home. Patient and provider in visit Provider location: Office  I discussed the limitations of evaluation and management by telemedicine and the availability of in person appointments. The patient expressed understanding and agreed to proceed.  Visit Date: 04/16/2021  Today's healthcare provider: Eugenia Pancoast, FNP     Subjective:    Patient ID: Aaron Zimmerman, male    DOB: 19-Mar-1950, 71 y.o.   MRN: [id number]  Chief Complaint  Patient presents with   Fatigue    Pt stated--fever 100.0, fatique, chills, headache, --since radiation implant--    HPI  Pt here with two days h/o of symptoms.  Had radiation seed implant about two weeks ago and stated a little fatigue would not be out of the ordinary. He had this placed by urologist Dr. Milford Cage and Dr. Kalman Shan.   Last two days with a lot of fatigue, fever 100.0, chills, headache and not feeling well. Last night fever up to 102 F, now to 98.6 F with tylenol. No sore throat no ear pain.  Cough the first week but not since. No chest congestion. No real nasal congestion. No sob or doe. No dizziness.   Past Medical History:  Diagnosis Date   Cataract right eye    Complication of anesthesia    slow to awaken after 2021 colonscopy   Diabetes mellitus Type 2    Hyperlipidemia    Personal history of COVID-19 07/2020   weakness & fatigue  x 1-2 weeks took oral paxlovid all symptoms resolved   Personal history of urinary calculi 2012   Prostate Cancer ([place])    Seasonal Allergies    Skin abnormality    bottom of left leg scratch healing well pt fell 3 weeks ago per pt on 03-24-2021   Vitamin B12 deficiency    Wears glasses for reading     Past Surgical History:  Procedure Laterality Date   COLONOSCOPY  2021   CYSTOSCOPY N/A 03/30/2021   Procedure: CYSTOSCOPY FLEXIBLE;  Surgeon: Remi Haggard, MD;  Location: [facility];  Service: Urology;  Laterality: N/A;  No seeds detected in bladder per Dr. Milford Cage   left eye cataract surgery     2-3 yrs ago per pt on 03-24-2021   POLYPECTOMY     RADIOACTIVE SEED IMPLANT N/A 03/30/2021   Procedure: RADIOACTIVE SEED IMPLANT/BRACHYTHERAPY IMPLANT;  Surgeon: Remi Haggard, MD;  Location: [facility];  Service: Urology;  Laterality: N/A;   SPACE OAR INSTILLATION N/A 03/30/2021   Procedure: SPACE OAR INSTILLATION;  Surgeon: Remi Haggard, MD;  Location: [facility];  Service: Urology;  Laterality: N/A;   traumatic injury and repair of RT hand  1974    Family History  Problem Relation Age of Onset   Heart disease Mother        CAD/PCI with stents; cardiomyopathy with AICD   Cancer  Other        Colon and Prostate   Colon cancer Neg Hx    Colon polyps Neg Hx    Esophageal cancer Neg Hx    Rectal cancer Neg Hx    Stomach cancer Neg Hx     Social History   Socioeconomic History   Marital status: Married    Spouse name: Not on file   Number of children: 2   Years of education: Not on file   Highest education level: Not on file  Occupational History   Occupation: Maintenance-- temperature controls    Employer: [employer]    Comment: 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  Tobacco Use   Smoking status: Never   Smokeless tobacco: Former    Types: Chew    Quit date: 02/08/1995  Vaping Use   Vaping Use: Never used  Substance and Sexual  Activity   Alcohol use: No   Drug use: No   Sexual activity: Yes  Other Topics Concern   Not on file  Social History Narrative   HSG   Married 19791 son 1979 1 daughter 59;    4 grandchildren      No living will   Wife would be health care POA--children are alternates   Would accept resuscitation but no prolonged ventilation or feeding tube if cognitively unaware   Social Determinants of Health   Financial Resource Strain: Not on file  Food Insecurity: Not on file  Transportation Needs: Not on file  Physical Activity: Not on file  Stress: Not on file  Social Connections: Not on file  Intimate Partner Violence: Not on file    Outpatient Medications Prior to Visit  Medication Sig Dispense Refill   cetirizine (ZYRTEC) 10 MG tablet Take 1 tablet (10 mg total) by mouth daily as needed for allergies. 90 tablet 3   Cyanocobalamin (B-12 PO) Take 1 tablet by mouth daily.      FARXIGA 10 MG TABS tablet TAKE 1 TABLET BY MOUTH ONCE DAILY 90 tablet 3   GNP ASPIRIN LOW DOSE 81 MG EC tablet TAKE 1 TABLET BY MOUTH DAILY SWALLOW WHOLE 90 tablet 3   insulin degludec (TRESIBA FLEXTOUCH) 100 UNIT/ML FlexTouch Pen Inject 50 Units into the skin daily. (Patient taking differently: Inject 50 Units into the skin at bedtime.) 45 mL 3   Insulin Pen Needle (PEN NEEDLES) 32G X 6 MM MISC 1 each by Does not apply route daily. 100 each 3   lisinopril (ZESTRIL) 10 MG tablet TAKE 1 TABLET BY MOUTH ONCE A DAY (Patient taking differently: For kidney protection not for htn per pt) 90 tablet 3   metFORMIN (GLUCOPHAGE) 1000 MG tablet TAKE 1 TABLET BY MOUTH TWICE A DAY WITH MEALS 180 tablet 3   Multiple Vitamin (MULTIVITAMIN WITH MINERALS) TABS tablet Take 1 tablet by mouth daily.     OVER THE COUNTER MEDICATION Red boost 2 tabs in am for ed     simvastatin (ZOCOR) 20 MG tablet TAKE ONE TABLET BY MOUTH EVERY NIGHT AT BEDTIME 90 tablet 3   traMADol (ULTRAM) 50 MG tablet Take 1 tablet (50 mg total) by mouth every 6  (six) hours as needed. 20 tablet 0   No facility-administered medications prior to visit.    Allergies  Allergen Reactions   Pioglitazone Rash    Actos rash and hot from waist up    Review of Systems  Constitutional:  Positive for chills, fatigue and fever.  HENT:  Negative for congestion, ear pain, sinus  pain and sore throat.   Respiratory:  Negative for cough, shortness of breath and wheezing.   Cardiovascular:  Negative for chest pain and palpitations.  Neurological:  Positive for headaches. Negative for dizziness.      Objective:    Physical Exam Constitutional:      General: He is not in acute distress.    Appearance: Normal appearance. He is not ill-appearing, toxic-appearing or diaphoretic.  Pulmonary:     Effort: Pulmonary effort is normal.  Neurological:     General: No focal deficit present.     Mental Status: He is alert and oriented to person, place, and time.  Psychiatric:        Mood and Affect: Mood normal.        Behavior: Behavior normal.        Thought Content: Thought content normal.        Judgment: Judgment normal.    BP 126/64    Pulse 90    Temp 98.6 F (37 C)    Ht '5\' 10"'$  (1.778 m)    Wt 212 lb (96.2 kg)    BMI 30.42 kg/m  Wt Readings from Last 3 Encounters:  04/16/21 212 lb (96.2 kg)  03/30/21 214 lb (97.1 kg)  03/04/21 219 lb (99.3 kg)       Assessment & Plan:   Problem List Items Addressed This Visit       Respiratory   Upper respiratory infection, acute    Suspected viral infection however will test for flu to r/o  Advised patient on supportive measures:  Be sure to rest, drink plenty of fluids, and use tylenol as needed for pain. Follow up if fever >101, if symptoms worsen or if symptoms are not improved in 3 days. Patient verbalizes understanding.           Other   Fever - Primary    Testing for rapid covid and flu to r/o viral      Relevant Orders   POC COVID-19 BinaxNow   POC Influenza A&B(BINAX/QUICKVUE) (Completed)     I am having Aaron C. Ripley Fraise "Jimmy" maintain his cetirizine, Cyanocobalamin (B-12 PO), Pen Needles, GNP Aspirin Low Dose, simvastatin, Tresiba FlexTouch, Farxiga, metFORMIN, lisinopril, OVER THE COUNTER MEDICATION, multivitamin with minerals, and traMADol.  No orders of the defined types were placed in this encounter.   I discussed the assessment and treatment plan with the patient. The patient was provided an opportunity to ask questions and all were answered. The patient agreed with the plan and demonstrated an understanding of the instructions.   The patient was advised to call back or seek an in-person evaluation if the symptoms worsen or if the condition fails to improve as anticipated.  I provided 15 minutes of face-to-face time during this encounter.   Eugenia Pancoast, [place] [facility] [place] [phone] (phone) [phone] (fax)  [place]

## 2021-04-18 DIAGNOSIS — J069 Acute upper respiratory infection, unspecified: Secondary | ICD-10-CM | POA: Insufficient documentation

## 2021-04-18 NOTE — Assessment & Plan Note (Signed)
Suspected viral infection however will test for flu to r/o  ?Advised patient on supportive measures:  Be sure to rest, drink plenty of fluids, and use tylenol as needed for pain. Follow up if fever >101, if symptoms worsen or if symptoms are not improved in 3 days. Patient verbalizes understanding.  ? ? ?

## 2021-04-19 ENCOUNTER — Other Ambulatory Visit: Payer: Self-pay | Admitting: Family

## 2021-04-19 ENCOUNTER — Other Ambulatory Visit: Payer: Self-pay | Admitting: Internal Medicine

## 2021-04-19 DIAGNOSIS — R509 Fever, unspecified: Secondary | ICD-10-CM

## 2021-04-19 DIAGNOSIS — R5383 Other fatigue: Secondary | ICD-10-CM

## 2021-04-20 ENCOUNTER — Other Ambulatory Visit: Payer: Self-pay

## 2021-04-20 ENCOUNTER — Other Ambulatory Visit: Payer: Self-pay | Admitting: Family

## 2021-04-20 ENCOUNTER — Other Ambulatory Visit (INDEPENDENT_AMBULATORY_CARE_PROVIDER_SITE_OTHER): Payer: Medicare PPO

## 2021-04-20 ENCOUNTER — Telehealth: Payer: Self-pay | Admitting: *Deleted

## 2021-04-20 DIAGNOSIS — R509 Fever, unspecified: Secondary | ICD-10-CM

## 2021-04-20 DIAGNOSIS — R739 Hyperglycemia, unspecified: Secondary | ICD-10-CM | POA: Diagnosis not present

## 2021-04-20 DIAGNOSIS — R5383 Other fatigue: Secondary | ICD-10-CM

## 2021-04-20 LAB — CBC WITH DIFFERENTIAL/PLATELET
Basophils Absolute: 0.1 10*3/uL (ref 0.0–0.1)
Basophils Relative: 0.6 % (ref 0.0–3.0)
Eosinophils Absolute: 0.1 10*3/uL (ref 0.0–0.7)
Eosinophils Relative: 0.7 % (ref 0.0–5.0)
HCT: 47.9 % (ref 39.0–52.0)
Hemoglobin: 15.8 g/dL (ref 13.0–17.0)
Lymphocytes Relative: 12.5 % (ref 12.0–46.0)
Lymphs Abs: 1 10*3/uL (ref 0.7–4.0)
MCHC: 32.9 g/dL (ref 30.0–36.0)
MCV: 86 fl (ref 78.0–100.0)
Monocytes Absolute: 0.6 10*3/uL (ref 0.1–1.0)
Monocytes Relative: 7.3 % (ref 3.0–12.0)
Neutro Abs: 6.4 10*3/uL (ref 1.4–7.7)
Neutrophils Relative %: 78.9 % — ABNORMAL HIGH (ref 43.0–77.0)
Platelets: 248 10*3/uL (ref 150.0–400.0)
RBC: 5.57 Mil/uL (ref 4.22–5.81)
RDW: 12.8 % (ref 11.5–15.5)
WBC: 8.1 10*3/uL (ref 4.0–10.5)

## 2021-04-20 LAB — COMPREHENSIVE METABOLIC PANEL
ALT: 12 U/L (ref 0–53)
AST: 12 U/L (ref 0–37)
Albumin: 4.3 g/dL (ref 3.5–5.2)
Alkaline Phosphatase: 62 U/L (ref 39–117)
BUN: 13 mg/dL (ref 6–23)
CO2: 29 mEq/L (ref 19–32)
Calcium: 9.8 mg/dL (ref 8.4–10.5)
Chloride: 98 mEq/L (ref 96–112)
Creatinine, Ser: 0.98 mg/dL (ref 0.40–1.50)
GFR: 79.27 mL/min (ref >60.00)
Glucose, Bld: 429 mg/dL — ABNORMAL HIGH (ref 70–99)
Potassium: 4.2 mEq/L (ref 3.5–5.1)
Sodium: 135 mEq/L (ref 135–145)
Total Bilirubin: 0.7 mg/dL (ref 0.2–1.2)
Total Protein: 7.4 g/dL (ref 6.0–8.3)

## 2021-04-20 NOTE — Telephone Encounter (Signed)
Called patient to remind of post seed appts. for 04-21-21, lvm for a return call ?

## 2021-04-20 NOTE — Progress Notes (Signed)
?  Radiation Oncology         [phone] ?________________________________ ? ?Name: Aaron Zimmerman MRN: [id number]  ?Date: 04/21/2021  DOB: 08/01/1950 ? ?COMPLEX SIMULATION NOTE ? ?NARRATIVE:  The patient was brought to the [facility] today following prostate seed implantation approximately one month ago.  Identity was confirmed.  All relevant records and images related to the planned course of therapy were reviewed.  Then, the patient was set-up supine.  CT images were obtained.  The CT images were loaded into the planning software.  Then the prostate and rectum were contoured.  Treatment planning then occurred.  The implanted iodine 125 seeds were identified by the physics staff for projection of radiation distribution  I have requested : 3D Simulation  I have requested a DVH of the following structures: Prostate and rectum.   ? ?________________________________ ? ?Sheral Apley Tammi Klippel, M.D. ? ?

## 2021-04-20 NOTE — Addendum Note (Signed)
Addended by: Carter Kitten on: 04/20/2021 09:24 AM ? ? Modules accepted: Orders ? ?

## 2021-04-20 NOTE — Progress Notes (Signed)
Are we able to add hga1c? Will add order ? ?Also for CMA: Can we get this pt onto schedule ASAP for elevated sugar. Also copying to Dr. Silvio Pate so he is aware ? ?Urine collected today pending results, urinalysis.

## 2021-04-20 NOTE — Progress Notes (Signed)
Please also ask pt if he was fasting

## 2021-04-21 ENCOUNTER — Ambulatory Visit
Admission: RE | Admit: 2021-04-21 | Discharge: 2021-04-21 | Disposition: A | Discharge status: Home / Self Care | Payer: Medicare PPO | Source: Ambulatory Visit | Attending: Urology | Admitting: Urology

## 2021-04-21 ENCOUNTER — Encounter: Payer: Self-pay | Admitting: Urology

## 2021-04-21 ENCOUNTER — Telehealth: Payer: Self-pay

## 2021-04-21 ENCOUNTER — Ambulatory Visit
Admission: RE | Admit: 2021-04-21 | Discharge: 2021-04-21 | Disposition: A | Discharge status: Home / Self Care | Payer: Medicare PPO | Source: Ambulatory Visit | Attending: Radiation Oncology | Admitting: Radiation Oncology

## 2021-04-21 VITALS — BP 111/67 | HR 81 | Temp 97.1°F | Resp 18 | Ht 68.0 in | Wt 210.8 lb

## 2021-04-21 DIAGNOSIS — R5383 Other fatigue: Secondary | ICD-10-CM | POA: Insufficient documentation

## 2021-04-21 DIAGNOSIS — Z7982 Long term (current) use of aspirin: Secondary | ICD-10-CM | POA: Insufficient documentation

## 2021-04-21 DIAGNOSIS — Z7984 Long term (current) use of oral hypoglycemic drugs: Secondary | ICD-10-CM | POA: Insufficient documentation

## 2021-04-21 DIAGNOSIS — Z08 Encounter for follow-up examination after completed treatment for malignant neoplasm: Secondary | ICD-10-CM | POA: Diagnosis not present

## 2021-04-21 DIAGNOSIS — R231 Pallor: Secondary | ICD-10-CM | POA: Insufficient documentation

## 2021-04-21 DIAGNOSIS — Z79899 Other long term (current) drug therapy: Secondary | ICD-10-CM | POA: Insufficient documentation

## 2021-04-21 DIAGNOSIS — C61 Malignant neoplasm of prostate: Secondary | ICD-10-CM | POA: Insufficient documentation

## 2021-04-21 DIAGNOSIS — R232 Flushing: Secondary | ICD-10-CM | POA: Insufficient documentation

## 2021-04-21 DIAGNOSIS — Z794 Long term (current) use of insulin: Secondary | ICD-10-CM | POA: Insufficient documentation

## 2021-04-21 DIAGNOSIS — R300 Dysuria: Secondary | ICD-10-CM | POA: Insufficient documentation

## 2021-04-21 DIAGNOSIS — Z510 Encounter for antineoplastic radiation therapy: Secondary | ICD-10-CM | POA: Diagnosis not present

## 2021-04-21 LAB — URINALYSIS, COMPLETE
Bacteria, UA: NONE SEEN /HPF
Bilirubin Urine: NEGATIVE
Hgb urine dipstick: NEGATIVE
Hyaline Cast: NONE SEEN /LPF
Leukocytes,Ua: NEGATIVE
Nitrite: NEGATIVE
Protein, ur: NEGATIVE
RBC / HPF: NONE SEEN /HPF (ref 0–2)
Specific Gravity, Urine: 1.035 (ref 1.001–1.035)
Squamous Epithelial / HPF: NONE SEEN /HPF (ref <=5)
WBC, UA: NONE SEEN /HPF (ref 0–5)
pH: 5.5 (ref 5.0–8.0)

## 2021-04-21 LAB — URINE CULTURE
MICRO NUMBER:: 13128574
Result:: NO GROWTH
SPECIMEN QUALITY:: ADEQUATE

## 2021-04-21 LAB — HEMOGLOBIN A1C: Hgb A1c MFr Bld: 9 % — ABNORMAL HIGH (ref 4.6–6.5)

## 2021-04-21 LAB — EXTRA URINE SPECIMEN

## 2021-04-21 MED ORDER — SILODOSIN 8 MG PO CAPS: 8.0000 mg | ORAL_CAPSULE | Freq: Every day | ORAL | 2 refills | Status: DC

## 2021-04-21 NOTE — Progress Notes (Signed)
Urinalysis with glucose however pt on farxiga so expected. ?Can we call and make sure urine culture is ordered for the 'extra' urine that was sent?

## 2021-04-21 NOTE — Telephone Encounter (Signed)
I spoke with Retia Passe CMA and she said to send access note to her. ? ?[place] Night - Client ?TELEPHONE ADVICE RECORD ?AccessNurse? ?Patient ?Name: ?Aaron Zimmerman ?ELL ?Gender: Male ?DOB: 1950/08/17 ?Age: 71 Y 5 M 10 D ?Return ?Phone ?Number: ?[phone] ?(Primary), ?[id number] ?(Secondary) ?Address: ?City/ ?State/ ?Zip: [place]  ? [address] ?Client [place] Night - Client ?Client Site [place] ?Provider Viviana Simpler- MD ?Contact Type Call ?Who Is Calling Patient / Member / Family / Caregiver ?Call Type Triage / Clinical ?Caller Name Royal Hawthorn ?Relationship To Patient Spouse ?Return Phone Number [phone] (Primary) ?Chief Complaint Blood Sugar High ?Reason for Call Symptomatic / Request for Health Information ?Initial Comment Caller states her husband had blood work done and ?they got the results on my chart. His glucose came ?back at 429. The office called them and they were ?supposed to call back and they never did. ?Translation No ?Nurse Assessment ?Nurse: Lissa Merlin, RN, [place] Date/Time Eilene Ghazi Time): 04/20/2021 8:21:28 PM ?Confirm and document reason for call. If ?symptomatic, describe symptoms. ?---Caller states that blood sugar 429 on lab work ?was this morning was 199. Pt states that he has been ?really sick and has not been taking his insulin at night ?becasue he has been sick. Pt had a radiation seed ?implant 3 weeks ago and then this week has been ?extremely tried. Pt states that he is feeling less tired. ?Pt started drinking pedilyte with water and has gotten ?some strength back. ?Does the patient have any new or worsening ?symptoms? ---Yes ?Will a triage be completed? ---Yes ?Related visit to physician within the last 2 weeks? ---Yes ?Does the PT have any chronic conditions? (i.e. ?diabetes, asthma, this includes High risk factors for ?pregnancy, etc.) ?---Yes ?List chronic conditions. ---Diabetes Prostate  Cancer ?Is this a behavioral health or substance abuse call? ---No ?Guidelines ?Guideline Title Affirmed Question Affirmed Notes Nurse Date/Time (Eastern ?Time) ?Diabetes - High ?Blood Sugar ?Blood glucose 70-240 ?mg/dL (3.9 -13.3 ?mmol/L) ?Lissa Merlin, RN, [place] 04/20/2021 8:25:40 ?PM ?PLEASE NOTE: All timestamps contained within this report are represented as [place] Standard Time. ?CONFIDENTIALTY NOTICE: This fax transmission is intended only for the addressee. It contains information that is legally privileged, confidential or ?otherwise protected from use or disclosure. If you are not the intended recipient, you are strictly prohibited from reviewing, disclosing, copying using ?or disseminating any of this information or taking any action in reliance on or regarding this information. If you have received this fax in error, please ?notify us immediately by telephone so that we can arrange for its return to [place]. Phone: [phone], Toll-Free: [phone], Fax: [phone] ?Page: 2 of 2 ?Call Id: [id number] ?Guidelines ?Guideline Title Affirmed Question Affirmed Notes Nurse Date/Time (Eastern ?Time) ?Pale Skin [1] New-onset pallor ?AND [2] mild change ?AND [3] present > 3 ?days ?Lissa Merlin, RN, [place] 04/20/2021 8:34:35 ?PM ?Disp. Time (Eastern ?Time) Disposition Final User ?04/20/2021 8:34:10 Forrest, RN, Vernie Shanks ?04/20/2021 8:39:20 PM SEE PCP WITHIN 3 DAYS Yes Lissa Merlin, RN, [place] ?Caller Disagree/Comply Comply ?Caller Understands Yes ?PreDisposition Go to ED ?Care Advice Given Per Guideline ?HOME CARE: * You should be able to treat this at home. HIGH BLOOD SUGAR (HYPERGLYCEMIA): * Definition: Fasting ?blood glucose of 126 mg/dL (7.0 mmol/L) or above, or random blood glucose over 200 mg/dL (11.1 mmol/L). * Symptoms of mildly ?high blood sugar: Frequent urination (peeing), increased thirst, fatigue, blurred vision. MEASURE AND RECORD YOUR BLOOD ?GLUCOSE: * Measure your blood  glucose before breakfast and before going  to bed. CALL BACK IF: * Blood glucose over 300 mg/ ?dL (16.7 mmol/L) two or more times in a row * You become worse CARE ADVICE given per Diabetes - High Blood Sugar (Adult) ?guideline. ?SEE PCP WITHIN 3 DAYS: * You need to be seen within 2 or 3 days. * PCP VISIT: Call your doctor (or NP/PA) during regular ?office hours and make an appointment. A clinic or urgent care center are good places to go for care if your doctor's office is closed ?or you can't get an appointment. NOTE: If office will be open tomorrow, tell caller to call then, not in 3 days. CALL BACK IF: * ?Fainting occurs * Weakness or dizziness occurs * You become worse CARE ADVICE given per Pale Skin (Adult) guideline. ?Comments ?User: Tildon Husky, RN Date/Time Eilene Ghazi Time): 04/20/2021 8:27:34 PM ?CBG 205 ?Referrals ?REFERRED TO PCP OFFIC ?

## 2021-04-21 NOTE — Progress Notes (Signed)
Can we please get this pt in with Dr. Silvio Pate sooner than later?  ?Last week I saw him via video visit, he continued to feel week and due to lack of availability as I was not in office and Dr. Silvio Pate could not add him on I advised him to go to [facility] but pt refused. I did lab work for him as pt requested it, and was found to have elevated glucose however pt had just had cereal right before blood draw. His glucose this am at home 199, isn't taking insulin since radiation seeds have been put in because he has been feeling ill. He feels slightly better since drinking pedialyte and is getting strength back (I assume viral because covid neg) but I still feel he needs to come in to be assessed.

## 2021-04-21 NOTE — Progress Notes (Signed)
?Radiation Oncology         [phone] ?________________________________ ? ?Name: Aaron Zimmerman MRN: [id number]  ?Date: 04/21/2021  DOB: 28-Jun-1950 ? ?Post-Seed Follow-Up Visit Note ? ?CC: Venia Carbon, MD  Remi Haggard, MD ? ?Diagnosis:   71 y.o. gentleman with Stage T1c adenocarcinoma of the prostate with Gleason score of 3+4, and PSA of 6.42 ? ?  ICD-10-CM   ?1. Malignant neoplasm of prostate ([place])  C61   ?  ? ? ?Interval Since Last Radiation:  3 weeks ?03/30/21:  Insertion of radioactive I-125 seeds into the prostate gland; 145 Gy, definitive therapy with placement of SpaceOAR gel. ? ?Narrative:  The patient returns today for routine follow-up.  He is complaining of increased urinary frequency and urinary hesitation symptoms. He filled out a questionnaire regarding urinary function today providing and overall IPSS score of 19 characterizing his symptoms as moderate. He is not currently taking any medications to help manage his LUTS. He also reports that his blood sugars have been running very high recently because he had not been taking his insulin as directed due to not feeling well. He has resumed the insulin as of last night and his blood sugars have come down significantly and he is feeling better since resuming the insulin. He has had severe fatigue over the past 1-2 weeks which could be a combination of uncontrolled diabetes and radiation fatigue.  He also reports occasional episodes of facial pallor followed by facial flushing but denies feeling lightheaded or dizzy when these episodes occur and he has not been able to identify any specific triggers. There is no associated shortness of breath or chest pain, but does seem to occur when he is feeling very tired. I'm not sure if this could be associated with low testosterone although he is not on ADT and unaware of any recent testosterone levels. He will address this at his upcoming follow up visit in Urology on 04/23/21.  His pre-implant IPSS  score was 6. He reports some mild dysuria at the start of his stream but denies gross hematuria or straining to void. He has had several episodes of urge incontinence because he couldn't get to the bathroom quickly enough. He denies any abdominal pain but has noted more frequent, loose stools with a clear-cloudly discharge. There is no associated rectal pain or pressure with BMs. ? ?ALLERGIES:  is allergic to pioglitazone. ? ?Meds: ?Current Outpatient Medications  ?Medication Sig Dispense Refill  ? cetirizine (ZYRTEC) 10 MG tablet Take 1 tablet (10 mg total) by mouth daily as needed for allergies. 90 tablet 3  ? Cyanocobalamin (B-12 PO) Take 1 tablet by mouth daily.     ? FARXIGA 10 MG TABS tablet TAKE 1 TABLET BY MOUTH ONCE DAILY 90 tablet 3  ? GNP ASPIRIN LOW DOSE 81 MG EC tablet TAKE 1 TABLET BY MOUTH DAILY SWALLOW WHOLE 90 tablet 3  ? insulin degludec (TRESIBA FLEXTOUCH) 100 UNIT/ML FlexTouch Pen Inject 50 Units into the skin daily. (Patient taking differently: Inject 50 Units into the skin at bedtime.) 45 mL 3  ? Insulin Pen Needle (PEN NEEDLES) 32G X 6 MM MISC 1 each by Does not apply route daily. 100 each 3  ? lisinopril (ZESTRIL) 10 MG tablet TAKE 1 TABLET BY MOUTH ONCE A DAY (Patient taking differently: For kidney protection not for htn per pt) 90 tablet 3  ? metFORMIN (GLUCOPHAGE) 1000 MG tablet TAKE 1 TABLET BY MOUTH TWICE A DAY WITH MEALS 180 tablet  3  ? Multiple Vitamin (MULTIVITAMIN WITH MINERALS) TABS tablet Take 1 tablet by mouth daily.    ? OVER THE COUNTER MEDICATION Red boost 2 tabs in am for ed    ? simvastatin (ZOCOR) 20 MG tablet TAKE ONE TABLET BY MOUTH EVERY NIGHT AT BEDTIME 90 tablet 3  ? traMADol (ULTRAM) 50 MG tablet Take 1 tablet (50 mg total) by mouth every 6 (six) hours as needed. 20 tablet 0  ? ?No current facility-administered medications for this encounter.  ? ? ?Physical Findings: ?In general this is a well appearing Caucasian male in no acute distress. He's alert and oriented x4  and appropriate throughout the examination. Cardiopulmonary assessment is negative for acute distress and he exhibits normal effort.  ? ?Lab Findings: ?Lab Results  ?Component Value Date  ? WBC 8.1 04/20/2021  ? HGB 15.8 04/20/2021  ? HCT 47.9 04/20/2021  ? MCV 86.0 04/20/2021  ? PLT 248.0 04/20/2021  ? ? ?Radiographic Findings:  ?Patient underwent CT imaging in our clinic for post implant dosimetry. The CT will be reviewed by Dr. Tammi Klippel to confirm there is an adequate distribution of radioactive seeds throughout the prostate gland and ensure that there are no seeds in or near the rectum.  We suspect the final radiation plan and dosimetry will show appropriate coverage of the prostate gland. He understands that we will call and inform him of any unexpected findings on further review of his imaging and dosimetry. ? ?Impression/Plan: 71 y.o. gentleman with Stage T1c adenocarcinoma of the prostate with Gleason score of 3+4, and PSA of 6.42 ?The patient is recovering from the effects of radiation. His urinary symptoms should gradually improve over the next 4-6 months. We talked about this today. He is interested in trying an alpha blocker to help manage the radiation induced LUTS so I will send a Rx for Rapaflo to his pharmacy. He has a follow up visit with Urology on Friday 04/23/21 and will report his progress at that time. He will also discuss possibly checking a testosterone level to see if that may be the underlying cause of the facial flushing episodes and significant generalized fatigue. With regards to the passage of mucous with flatulence and BMs, this sounds like it is simply a result of inflammation, since there is no pain associated with passage of BMs, and will resolve on it's own, with time. We discussed this at length today. I also reviewed recent lab work that he had with his PCP on 04/19/21 and did not see any evidence of anemia or UTI but noted significant hyperglycemia and a Hgb A1c of 9.0, indicating  poorly controlled diabetes which can also be contributing to his fatigue/malaise and polyuria. We also talked about long-term follow-up for prostate cancer following seed implant. He understands that ongoing PSA determinations and digital rectal exams will help perform surveillance to rule out disease recurrence. He has a follow up appointment scheduled with one of the physician assistants at [facility] on Friday 04/23/21. He understands what to expect with his PSA measures. Patient was also educated today about some of the long-term effects from radiation including a small risk for rectal bleeding and possibly erectile dysfunction. We talked about some of the general management approaches to these potential complications. However, I did encourage the patient to contact our office or return at any point if he has questions or concerns related to his previous radiation and prostate cancer. ? ? ? ?Nicholos Johns, PA-C ? ?

## 2021-04-21 NOTE — Telephone Encounter (Signed)
I called and left a message for pt. I have put a hold on the 12pm appt 04-23-21 for him. Hopefully he can take that spot. ?

## 2021-04-21 NOTE — Progress Notes (Signed)
Patient here for "Post Seed" appointment w/ Ashlyn Bruning PA-C. I spoke w/ patient, verified identity, and began nursing interview w/ Spouse Royal Hawthorn in attendance. Patient reports dysuria, polyuria, frequent loose stools w/ a clear/ white discharge seen in stool, extreme fatigue, nocturia x3, and loss of facial color, that comes and goes on occasion. No other issues reported to me at this time. ? ?Meaningful use complete. ?I-PSS score of 19 (moderate). ?No urinary management medications. ?Urology F/U appointment- March 21st, 2023 -per [facility]. ? ?BP 111/67 (BP Location: Left Arm, Patient Position: Sitting, Cuff Size: Normal)   Pulse 81   Temp (!) 97.1 ?F (36.2 ?C) (Temporal)   Resp 18   Ht '5\' 8"'$  (1.727 m)   Wt 210 lb 12.8 oz (95.6 kg)   SpO2 99%   BMI 32.05 kg/m?  ? ?

## 2021-04-23 ENCOUNTER — Telehealth: Payer: Self-pay

## 2021-04-23 ENCOUNTER — Encounter: Payer: Self-pay | Admitting: Internal Medicine

## 2021-04-23 ENCOUNTER — Other Ambulatory Visit: Payer: Self-pay

## 2021-04-23 ENCOUNTER — Ambulatory Visit (INDEPENDENT_AMBULATORY_CARE_PROVIDER_SITE_OTHER): Payer: Medicare PPO | Admitting: Internal Medicine

## 2021-04-23 DIAGNOSIS — E1159 Type 2 diabetes mellitus with other circulatory complications: Secondary | ICD-10-CM

## 2021-04-23 NOTE — Patient Instructions (Signed)
Please keep the insulin at 55 units daily for now. ?If your fasting sugars stay over 140, increase the insulin by 2 units every week or so--till you get up to 65 units. Let me know if that doesn't get it down now. ?

## 2021-04-23 NOTE — Telephone Encounter (Signed)
Patient is calling back insurance covers [employer] for Blood sugar checking is covered at a 100%. They also cover the test stripe as 100%. Please send to [facility]

## 2021-04-23 NOTE — Progress Notes (Signed)
? ?Subjective:  ? ? Patient ID: Aaron Zimmerman, male    DOB: 11-Jan-1951, 71 y.o.   MRN: [id number] ? ?HPI ?Here due to worsening blood sugars ? ?Had prostate radioactive seeds put in 3 weeks ago ?Did well at first--for 2 weeks ?Increased his activity---some yard work ?Then started feeling bad---"just give out" ?Worsening fatigue---wife checked with cancer folks---wanted him checked ?Then had virtual visit with Tabitha ?COVID and flu negative ? ?No improvement so had labs ?Glucose was 429----but had stopped the insulin for a week due to feeling so bad ?Wasn't able to eat ?Some diarrhea---especially after eating (loose stool) ? ?Now up and down urinating every few minutes ? ?Did take the insulin right after sugar was elevated ?Sugars did come down some ?Got new test strips---but needs new meter now ? ?Increased insulin on 3/14 to 55---the to 60 the next day ?Down to 107/111  ?188 after dinner ? ?Current Outpatient Medications on File Prior to Visit  ?Medication Sig Dispense Refill  ? cetirizine (ZYRTEC) 10 MG tablet Take 1 tablet (10 mg total) by mouth daily as needed for allergies. 90 tablet 3  ? Cyanocobalamin (B-12 PO) Take 1 tablet by mouth daily.     ? FARXIGA 10 MG TABS tablet TAKE 1 TABLET BY MOUTH ONCE DAILY 90 tablet 3  ? insulin degludec (TRESIBA FLEXTOUCH) 100 UNIT/ML FlexTouch Pen Inject 50 Units into the skin daily. (Patient taking differently: Inject 50 Units into the skin at bedtime.) 45 mL 3  ? Insulin Pen Needle (PEN NEEDLES) 32G X 6 MM MISC 1 each by Does not apply route daily. 100 each 3  ? lisinopril (ZESTRIL) 10 MG tablet TAKE 1 TABLET BY MOUTH ONCE A DAY (Patient taking differently: For kidney protection not for htn per pt) 90 tablet 3  ? metFORMIN (GLUCOPHAGE) 1000 MG tablet TAKE 1 TABLET BY MOUTH TWICE A DAY WITH MEALS 180 tablet 3  ? Multiple Vitamin (MULTIVITAMIN WITH MINERALS) TABS tablet Take 1 tablet by mouth daily.    ? OVER THE COUNTER MEDICATION Red boost 2 tabs in am for ed    ?  silodosin (RAPAFLO) 8 MG CAPS capsule Take 1 capsule (8 mg total) by mouth daily with supper. 30 capsule 2  ? simvastatin (ZOCOR) 20 MG tablet TAKE ONE TABLET BY MOUTH EVERY NIGHT AT BEDTIME 90 tablet 3  ? traMADol (ULTRAM) 50 MG tablet Take 1 tablet (50 mg total) by mouth every 6 (six) hours as needed. 20 tablet 0  ? ?No current facility-administered medications on file prior to visit.  ? ? ?Allergies  ?Allergen Reactions  ? Pioglitazone Rash  ?  Actos rash and hot from waist up  ? ? ?Past Medical History:  ?Diagnosis Date  ? Cataract right eye   ? Complication of anesthesia   ? slow to awaken after 2021 colonscopy  ? Diabetes mellitus Type 2   ? Hyperlipidemia   ? Personal history of COVID-19 07/2020  ? weakness & fatigue x 1-2 weeks took oral paxlovid all symptoms resolved  ? Personal history of urinary calculi 2012  ? Prostate Cancer [facility])   ? Seasonal Allergies   ? Skin abnormality   ? bottom of left leg scratch healing well pt fell 3 weeks ago per pt on 03-24-2021  ? Vitamin B12 deficiency   ? Wears glasses for reading   ? ? ?Past Surgical History:  ?Procedure Laterality Date  ? COLONOSCOPY  2021  ? CYSTOSCOPY N/A 03/30/2021  ? Procedure: CYSTOSCOPY  FLEXIBLE;  Surgeon: Remi Haggard, MD;  Location: [facility];  Service: Urology;  Laterality: N/A;  No seeds detected in bladder per Dr. Milford Cage  ? left eye cataract surgery    ? 2-3 yrs ago per pt on 03-24-2021  ? POLYPECTOMY    ? RADIOACTIVE SEED IMPLANT N/A 03/30/2021  ? Procedure: RADIOACTIVE SEED IMPLANT/BRACHYTHERAPY IMPLANT;  Surgeon: Remi Haggard, MD;  Location: [facility];  Service: Urology;  Laterality: N/A;  ? SPACE OAR INSTILLATION N/A 03/30/2021  ? Procedure: SPACE OAR INSTILLATION;  Surgeon: Remi Haggard, MD;  Location: [facility];  Service: Urology;  Laterality: N/A;  ? traumatic injury and repair of RT hand  1974  ? ? ?Family History  ?Problem Relation Age of Onset  ? Heart disease Mother   ?      CAD/PCI with stents; cardiomyopathy with AICD  ? Cancer Other   ?     Colon and Prostate  ? Colon cancer Neg Hx   ? Colon polyps Neg Hx   ? Esophageal cancer Neg Hx   ? Rectal cancer Neg Hx   ? Stomach cancer Neg Hx   ? ? ?Social History  ? ?Socioeconomic History  ? Marital status: Married  ?  Spouse name: Not on file  ? Number of children: 2  ? Years of education: Not on file  ? Highest education level: Not on file  ?Occupational History  ? Occupation: Maintenance-- temperature controls  ?  Employer: [employer]  ?  Comment: 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  ?Tobacco Use  ? Smoking status: Never  ? Smokeless tobacco: Former  ?  Types: Chew  ?  Quit date: 02/08/1995  ?Vaping Use  ? Vaping Use: Never used  ?Substance and Sexual Activity  ? Alcohol use: No  ? Drug use: No  ? Sexual activity: Yes  ?Other Topics Concern  ? Not on file  ?Social History Narrative  ? HSG  ? Married 19791 son 4 1 daughter 56;   ? 4 grandchildren  ?   ? No living will  ? Wife would be health care POA--children are alternates  ? Would accept resuscitation but no prolonged ventilation or feeding tube if cognitively unaware  ? ?Social Determinants of Health  ? ?Financial Resource Strain: Not on file  ?Food Insecurity: Not on file  ?Transportation Needs: Not on file  ?Physical Activity: Not on file  ?Stress: Not on file  ?Social Connections: Not on file  ?Intimate Partner Violence: Not on file  ? ?Review of Systems ? ?   ?Objective:  ? Physical Exam ?Constitutional:   ?   Appearance: Normal appearance.  ?Cardiovascular:  ?   Rate and Rhythm: Normal rate and regular rhythm.  ?   Heart sounds: No murmur heard. ?  No gallop.  ?Pulmonary:  ?   Effort: Pulmonary effort is normal.  ?   Breath sounds: Normal breath sounds.  ?Abdominal:  ?   General: Bowel sounds are normal.  ?   Palpations: Abdomen is soft.  ?   Tenderness: There is no abdominal tenderness. There is no guarding or rebound.  ?Musculoskeletal:  ?   Cervical back: Neck supple.  ?   Right  lower leg: No edema.  ?   Left lower leg: No edema.  ?Lymphadenopathy:  ?   Cervical: No cervical adenopathy.  ?Neurological:  ?   Mental Status: He is alert.  ?  ? ? ? ? ?   ?  Assessment & Plan:  ? ?

## 2021-04-23 NOTE — Assessment & Plan Note (Signed)
Exacerbated with recent radioactive seen implant and feeling so bad ?Lab Results  ?Component Value Date  ? HGBA1C 9.0 (H) 04/20/2021  ? ?Will continue the metformin 1000 bid ?farxiga 10 ?Increased insulin to 55. Will inch it up  ?Follow up 3 months ?

## 2021-04-26 MED ORDER — ACCU-CHEK SOFTCLIX LANCETS MISC: 12 refills | Status: DC

## 2021-04-26 MED ORDER — ACCU-CHEK GUIDE ME W/DEVICE KIT: 1.0000 | PACK | Freq: Once | 0 refills | Status: AC

## 2021-04-26 MED ORDER — ACCU-CHEK SOFTCLIX LANCET DEV KIT: 1.0000 | PACK | Freq: Once | 0 refills | Status: AC

## 2021-04-26 MED ORDER — ACCU-CHEK GUIDE VI STRP: ORAL_STRIP | 12 refills | Status: DC

## 2021-04-26 NOTE — Telephone Encounter (Signed)
Accu-chek Guide and supplies sent to pharmacy. ?

## 2021-04-27 DIAGNOSIS — C61 Malignant neoplasm of prostate: Secondary | ICD-10-CM | POA: Diagnosis not present

## 2021-05-07 ENCOUNTER — Telehealth: Payer: Self-pay | Admitting: *Deleted

## 2021-05-07 NOTE — Telephone Encounter (Signed)
-----   Message from Zara Council sent at 05/07/2021 11:33 AM EDT ----- ?Regarding: Appointment ?Patient is scheduled for 05/10/21 at 12:00 PM with Dr. Angelena Form. ? ?

## 2021-05-10 ENCOUNTER — Encounter: Payer: Self-pay | Admitting: Cardiovascular Disease

## 2021-05-10 ENCOUNTER — Ambulatory Visit: Payer: Medicare PPO | Admitting: Cardiovascular Disease

## 2021-05-10 ENCOUNTER — Encounter: Payer: Self-pay | Admitting: *Deleted

## 2021-05-10 VITALS — BP 130/60 | HR 94 | Ht 68.0 in | Wt 214.4 lb

## 2021-05-10 DIAGNOSIS — I2510 Atherosclerotic heart disease of native coronary artery without angina pectoris: Secondary | ICD-10-CM

## 2021-05-10 DIAGNOSIS — R5383 Other fatigue: Secondary | ICD-10-CM | POA: Diagnosis not present

## 2021-05-10 NOTE — Progress Notes (Signed)
? ?Chief Complaint  ?Patient presents with  ? New Patient (Initial Visit)  ?  CAD  ? ?History of Present Illness: 71 yo male with history of diabetes, hyperlipidemia, prostate cancer and CAD seen on chest CT who is here today as a new patient for the evaluation of fatigue. ?He was seen in 2018 by Dr. Gwenlyn Found. Nuclear stress test in November 2018 with no ischemia. He has not had a cardiac cath. He has undergone treatment for prostate cancer with radiation seeds. He now reports having episodes of weakness and fatigue. He has been having episodes where he becomes flushed and his face turns red then white. His BP and blood sugar are ok during these episodes. This started two weeks after his radiation seeds were placed. No chest pain or dyspnea. NO LE edema or near syncope.  ? ?Primary Care Physician: Venia Carbon, MD ? ?Past Medical History:  ?Diagnosis Date  ? Cataract right eye   ? Complication of anesthesia   ? slow to awaken after 2021 colonscopy  ? Diabetes mellitus Type 2   ? Hyperlipidemia   ? Personal history of COVID-19 07/2020  ? weakness & fatigue x 1-2 weeks took oral paxlovid all symptoms resolved  ? Personal history of urinary calculi 2012  ? Prostate Cancer [facility])   ? Seasonal Allergies   ? Skin abnormality   ? bottom of left leg scratch healing well pt fell 3 weeks ago per pt on 03-24-2021  ? Vitamin B12 deficiency   ? Wears glasses for reading   ? ? ?Past Surgical History:  ?Procedure Laterality Date  ? COLONOSCOPY  2021  ? CYSTOSCOPY N/A 03/30/2021  ? Procedure: CYSTOSCOPY FLEXIBLE;  Surgeon: Remi Haggard, MD;  Location: [facility];  Service: Urology;  Laterality: N/A;  No seeds detected in bladder per Dr. Milford Cage  ? left eye cataract surgery    ? 2-3 yrs ago per pt on 03-24-2021  ? POLYPECTOMY    ? RADIOACTIVE SEED IMPLANT N/A 03/30/2021  ? Procedure: RADIOACTIVE SEED IMPLANT/BRACHYTHERAPY IMPLANT;  Surgeon: Remi Haggard, MD;  Location: [facility];  Service:  Urology;  Laterality: N/A;  ? SPACE OAR INSTILLATION N/A 03/30/2021  ? Procedure: SPACE OAR INSTILLATION;  Surgeon: Remi Haggard, MD;  Location: [facility];  Service: Urology;  Laterality: N/A;  ? traumatic injury and repair of RT hand  1974  ? ? ?Current Outpatient Medications  ?Medication Sig Dispense Refill  ? Accu-Chek Softclix Lancets lancets Use to obtain blood sample to check blood sugar once daily. Dx code E11.59 100 each 12  ? cetirizine (ZYRTEC) 10 MG tablet Take 1 tablet (10 mg total) by mouth daily as needed for allergies. 90 tablet 3  ? Cyanocobalamin (B-12 PO) Take 1 tablet by mouth daily.     ? FARXIGA 10 MG TABS tablet TAKE 1 TABLET BY MOUTH ONCE DAILY 90 tablet 3  ? glucose blood (ACCU-CHEK GUIDE) test strip Use to check blood sugar once daily. Dx code E11.59 100 each 12  ? insulin degludec (TRESIBA FLEXTOUCH) 100 UNIT/ML FlexTouch Pen Inject 50 Units into the skin daily. (Patient taking differently: Inject 50 Units into the skin at bedtime.) 45 mL 3  ? Insulin Pen Needle (PEN NEEDLES) 32G X 6 MM MISC 1 each by Does not apply route daily. 100 each 3  ? lisinopril (ZESTRIL) 10 MG tablet TAKE 1 TABLET BY MOUTH ONCE A DAY (Patient taking differently: For kidney protection not for  htn per pt) 90 tablet 3  ? metFORMIN (GLUCOPHAGE) 1000 MG tablet TAKE 1 TABLET BY MOUTH TWICE A DAY WITH MEALS 180 tablet 3  ? Multiple Vitamin (MULTIVITAMIN WITH MINERALS) TABS tablet Take 1 tablet by mouth daily.    ? OVER THE COUNTER MEDICATION Red boost 2 tabs in am for ed    ? silodosin (RAPAFLO) 8 MG CAPS capsule Take 1 capsule (8 mg total) by mouth daily with supper. 30 capsule 2  ? simvastatin (ZOCOR) 20 MG tablet TAKE ONE TABLET BY MOUTH EVERY NIGHT AT BEDTIME 90 tablet 3  ? ?No current facility-administered medications for this visit.  ? ? ?Allergies  ?Allergen Reactions  ? Pioglitazone Rash  ?  Actos rash and hot from waist up  ? ? ?Social History  ? ?Socioeconomic History  ? Marital status:  Married  ?  Spouse name: Not on file  ? Number of children: 2  ? Years of education: Not on file  ? Highest education level: Not on file  ?Occupational History  ? Occupation: Maintenance-- temperature controls  ?  Employer: [employer]  ?  Comment: 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  ? Occupation: Retired-[place] [employer]  ?Tobacco Use  ? Smoking status: Never  ? Smokeless tobacco: Former  ?  Types: Chew  ?  Quit date: 02/08/1995  ?Vaping Use  ? Vaping Use: Never used  ?Substance and Sexual Activity  ? Alcohol use: No  ? Drug use: No  ? Sexual activity: Yes  ?Other Topics Concern  ? Not on file  ?Social History Narrative  ? HSG  ? Married 19791 son 60 1 daughter 56;   ? 4 grandchildren  ?   ? No living will  ? Wife would be health care POA--children are alternates  ? Would accept resuscitation but no prolonged ventilation or feeding tube if cognitively unaware  ? ?Social Determinants of Health  ? ?Financial Resource Strain: Not on file  ?Food Insecurity: Not on file  ?Transportation Needs: Not on file  ?Physical Activity: Not on file  ?Stress: Not on file  ?Social Connections: Not on file  ?Intimate Partner Violence: Not on file  ? ? ?Family History  ?Problem Relation Age of Onset  ? Heart disease Mother 71  ?     CAD/PCI with stents; cardiomyopathy with AICD  ? Cancer - Lung Father   ? Cancer Other   ?     Colon and Prostate  ? Colon cancer Neg Hx   ? Colon polyps Neg Hx   ? Esophageal cancer Neg Hx   ? Rectal cancer Neg Hx   ? Stomach cancer Neg Hx   ? ? ?Review of Systems:  As stated in the HPI and otherwise negative.  ? ?BP 130/60   Pulse 94   Ht '5\' 8"'$  (1.727 m)   Wt 214 lb 6.4 oz (97.3 kg)   SpO2 97%   BMI 32.60 kg/m?  ? ?Physical Examination: ?General: Well developed, well nourished, NAD  ?HEENT: OP clear, mucus membranes moist  ?SKIN: warm, dry. No rashes. ?Neuro: No focal deficits  ?Musculoskeletal: Muscle strength 5/5 all ext  ?Psychiatric: Mood and affect normal  ?Neck: No JVD, no carotid bruits, no  thyromegaly, no lymphadenopathy.  ?Lungs:Clear bilaterally, no wheezes, rhonci, crackles ?Cardiovascular: Regular rate and rhythm. No murmurs, gallops or rubs. ?Abdomen:Soft. Bowel sounds present. Non-tender.  ?Extremities: No lower extremity edema. Pulses are 2 + in the bilateral DP/PT. ? ?EKG:  EKG is ordered today. ?The ekg ordered  today demonstrates Sinus, RBBB ? ?Recent Labs: ?04/20/2021: ALT 12; BUN 13; Creatinine, Ser 0.98; Hemoglobin 15.8; Platelets 248.0; Potassium 4.2; Sodium 135  ? ?Lipid Panel ?   ?Component Value Date/Time  ? CHOL 105 12/15/2020 1125  ? TRIG 88.0 12/15/2020 1125  ? HDL 31.80 (L) 12/15/2020 1125  ? CHOLHDL 3 12/15/2020 1125  ? VLDL 17.6 12/15/2020 1125  ? LDLCALC 55 12/15/2020 1125  ? LDLDIRECT 84.3 03/06/2013 1401  ? ?  ?Wt Readings from Last 3 Encounters:  ?05/10/21 214 lb 6.4 oz (97.3 kg)  ?04/23/21 211 lb (95.7 kg)  ?04/21/21 210 lb 12.8 oz (95.6 kg)  ?  ?Assessment and Plan:  ? ?1. Fatigue: He has had episodes of flushing and weakness. He had radiation seed implants several weeks ago. No chest pain. Given history of CAD, will arrange an exercise nuclear stress test. Will arrange an echo to assess LVEF and exclude structural heart disease.  ? ?2. CAD without angina: Coronary calcification seen on chest CT in 2018. see above.  ? ?Labs/ tests ordered today include:  ? ?Orders Placed This Encounter  ?Procedures  ? Cardiac Stress Test: Informed Consent Details: Physician/Practitioner Attestation; Transcribe to consent form and obtain patient signature  ? MYOCARDIAL PERFUSION IMAGING  ? EKG 12-Lead  ? ECHOCARDIOGRAM COMPLETE  ? ?Disposition:   F/U with me in one year.  ? ?Signed, ?Lauree Chandler, MD ?05/10/2021 1:33 PM    ?[place] ?[address], [place], [place]  [address] ?Phone: [phone]; Fax: [phone]  ? ? ?

## 2021-05-10 NOTE — Patient Instructions (Signed)
Medication Instructions:  ?No changes ?*If you need a refill on your cardiac medications before your next appointment, please call your pharmacy* ? ? ?Lab Work: ?none ?If you have labs (blood work) drawn today and your tests are completely normal, you will receive your results only by: ?MyChart Message (if you have MyChart) OR ?A paper copy in the mail ?If you have any lab test that is abnormal or we need to change your treatment, we will call you to review the results. ? ? ?Testing/Procedures: ?Your physician has requested that you have en exercise stress myoview. For further information please visit [email or web address]. Please follow instruction sheet, as given. ? ?Your physician has requested that you have an echocardiogram. Echocardiography is a painless test that uses sound waves to create images of your heart. It provides your doctor with information about the size and shape of your heart and how well your heart?s chambers and valves are working. This procedure takes approximately one hour. There are no restrictions for this procedure. ? ? ?Follow-Up: ?At [facility], you and your health needs are our priority.  As part of our continuing mission to provide you with exceptional heart care, we have created designated Provider Care Teams.  These Care Teams include your primary Cardiologist (physician) and Advanced Practice Providers (APPs -  Physician Assistants and Nurse Practitioners) who all work together to provide you with the care you need, when you need it. ? ? ?Your next appointment:   ?12 month(s) ? ?The format for your next appointment:   ?In Person ? ?Provider:   ?Lauree Chandler, MD  ? ? ?Other Instructions ?  ?

## 2021-05-19 ENCOUNTER — Telehealth (HOSPITAL_COMMUNITY): Payer: Self-pay | Admitting: *Deleted

## 2021-05-19 ENCOUNTER — Encounter (HOSPITAL_COMMUNITY): Payer: Self-pay | Admitting: *Deleted

## 2021-05-19 NOTE — Telephone Encounter (Signed)
Left message on voicemail per DPR in reference to upcoming appointment scheduled on 05/26/21 at 0800 with detailed instructions given per Myocardial Perfusion Study Information Sheet for the test. LM to arrive 15 minutes early, and that it is imperative to arrive on time for appointment to keep from having the test rescheduled. If you need to cancel or reschedule your appointment, please call the office within 24 hours of your appointment. Failure to do so may result in a cancellation of your appointment, and a $50 no show fee. Phone number given for call back for any questions.  ?Mychart letter sent. Ailana Cuadrado, Ranae Palms ? ?

## 2021-05-21 ENCOUNTER — Encounter: Payer: Self-pay | Admitting: Radiation Oncology

## 2021-05-21 ENCOUNTER — Ambulatory Visit
Admission: RE | Admit: 2021-05-21 | Discharge: 2021-05-21 | Disposition: A | Discharge status: Home / Self Care | Payer: Medicare PPO | Source: Ambulatory Visit | Attending: Radiation Oncology | Admitting: Radiation Oncology

## 2021-05-21 DIAGNOSIS — C61 Malignant neoplasm of prostate: Secondary | ICD-10-CM | POA: Diagnosis not present

## 2021-05-21 DIAGNOSIS — Z191 Hormone sensitive malignancy status: Secondary | ICD-10-CM | POA: Diagnosis not present

## 2021-05-23 NOTE — Progress Notes (Signed)
?  Radiation Oncology         [phone] ?________________________________ ? ?Name: Aaron Zimmerman MRN: [id number]  ?Date: 05/21/2021  DOB: May 16, 1950 ? ?3D Planning Note  ? ?Prostate Brachytherapy Post-Implant Dosimetry ? ?Diagnosis: 71 y.o. gentleman with Stage T1c adenocarcinoma of the prostate with Gleason score of 3+4, and PSA of 6.42. ? ?Narrative: On a previous date, Aaron Zimmerman returned following prostate seed implantation for post implant planning. He underwent CT scan complex simulation to delineate the three-dimensional structures of the pelvis and demonstrate the radiation distribution.  Since that time, the seed localization, and complex isodose planning with dose volume histograms have now been completed. ? ?Results:   ?Prostate Coverage - The dose of radiation delivered to the 90% or more of the prostate gland (D90) was 95.14% of the prescription dose. This exceeds our goal of greater than 90%. ?Rectal Sparing - The volume of rectal tissue receiving the prescription dose or higher was 0.0 cc. This falls under our thresholds tolerance of 1.0 cc. ? ?Impression: The prostate seed implant appears to show adequate target coverage and appropriate rectal sparing. ? ?Plan:  The patient will continue to follow with urology for ongoing PSA determinations. I would anticipate a high likelihood for local tumor control with minimal risk for rectal morbidity. ? ?________________________________ ? ?Sheral Apley Tammi Klippel, M.D. ? ?

## 2021-05-26 ENCOUNTER — Ambulatory Visit (HOSPITAL_BASED_OUTPATIENT_CLINIC_OR_DEPARTMENT_OTHER): Payer: Medicare PPO

## 2021-05-26 ENCOUNTER — Ambulatory Visit (HOSPITAL_COMMUNITY): Payer: Medicare PPO | Attending: Internal Medicine

## 2021-05-26 DIAGNOSIS — I2510 Atherosclerotic heart disease of native coronary artery without angina pectoris: Secondary | ICD-10-CM

## 2021-05-26 DIAGNOSIS — R5383 Other fatigue: Secondary | ICD-10-CM | POA: Diagnosis not present

## 2021-05-26 LAB — MYOCARDIAL PERFUSION IMAGING
Angina Index: 0
Duke Treadmill Score: 6
Estimated workload: 7
Exercise duration (min): 6 min
LV dias vol: 74 mL (ref 62–150)
LV sys vol: 26 mL
MPHR: 152 {beats}/min
Nuc Stress EF: 64 %
Peak HR: 133 {beats}/min
Percent HR: 88 %
RPE: 18
Rest HR: 79 {beats}/min
Rest Nuclear Isotope Dose: 9.8 mCi
SDS: 0
SRS: 0
SSS: 0
ST Depression (mm): 0 mm
Stress Nuclear Isotope Dose: 31.1 mCi
TID: 0.9

## 2021-05-26 LAB — ECHOCARDIOGRAM COMPLETE
Area-P 1/2: 2.49 cm2
Height: 68 in
S' Lateral: 2.6 cm
Weight: 3424 [oz_av]

## 2021-05-26 MED ORDER — TECHNETIUM TC 99M TETROFOSMIN IV KIT
9.8000 | PACK | Freq: Once | INTRAVENOUS | Status: AC | PRN
  Administered 2021-05-26: 9.8 via INTRAVENOUS
  Filled 2021-05-26: qty 10

## 2021-05-26 MED ORDER — TECHNETIUM TC 99M TETROFOSMIN IV KIT
31.1000 | PACK | Freq: Once | INTRAVENOUS | Status: AC | PRN
  Administered 2021-05-26: 31.1 via INTRAVENOUS
  Filled 2021-05-26: qty 32

## 2021-05-26 MED ORDER — PERFLUTREN LIPID MICROSPHERE
2.0000 mL | INTRAVENOUS | Status: AC | PRN
  Administered 2021-05-26: 2 mL via INTRAVENOUS

## 2021-05-27 ENCOUNTER — Encounter: Payer: Self-pay | Admitting: Cardiovascular Disease

## 2021-06-15 ENCOUNTER — Ambulatory Visit: Payer: Medicare PPO | Admitting: Internal Medicine

## 2021-06-15 ENCOUNTER — Other Ambulatory Visit: Payer: Self-pay | Admitting: Internal Medicine

## 2021-06-21 ENCOUNTER — Other Ambulatory Visit: Payer: Self-pay | Admitting: Internal Medicine

## 2021-06-21 DIAGNOSIS — C61 Malignant neoplasm of prostate: Secondary | ICD-10-CM | POA: Diagnosis not present

## 2021-06-28 DIAGNOSIS — N5201 Erectile dysfunction due to arterial insufficiency: Secondary | ICD-10-CM | POA: Diagnosis not present

## 2021-06-28 DIAGNOSIS — R3589 Other polyuria: Secondary | ICD-10-CM | POA: Diagnosis not present

## 2021-06-28 DIAGNOSIS — C61 Malignant neoplasm of prostate: Secondary | ICD-10-CM | POA: Diagnosis not present

## 2021-07-09 DIAGNOSIS — H2511 Age-related nuclear cataract, right eye: Secondary | ICD-10-CM | POA: Diagnosis not present

## 2021-07-09 DIAGNOSIS — Z961 Presence of intraocular lens: Secondary | ICD-10-CM | POA: Diagnosis not present

## 2021-07-27 ENCOUNTER — Ambulatory Visit: Payer: Medicare PPO | Admitting: Internal Medicine

## 2021-07-27 ENCOUNTER — Encounter: Payer: Self-pay | Admitting: Internal Medicine

## 2021-07-27 VITALS — BP 122/80 | HR 80 | Temp 98.2°F | Ht 68.0 in | Wt 224.0 lb

## 2021-07-27 DIAGNOSIS — I10 Essential (primary) hypertension: Secondary | ICD-10-CM

## 2021-07-27 DIAGNOSIS — M65352 Trigger finger, left little finger: Secondary | ICD-10-CM | POA: Diagnosis not present

## 2021-07-27 DIAGNOSIS — E1159 Type 2 diabetes mellitus with other circulatory complications: Secondary | ICD-10-CM

## 2021-07-27 LAB — POCT GLYCOSYLATED HEMOGLOBIN (HGB A1C): Hemoglobin A1C: 7.7 % — AB (ref 4.0–5.6)

## 2021-07-27 MED ORDER — TRESIBA FLEXTOUCH 100 UNIT/ML ~~LOC~~ SOPN: 55.0000 [IU] | PEN_INJECTOR | Freq: Every day | SUBCUTANEOUS | 0 refills | Status: DC

## 2021-07-27 NOTE — Assessment & Plan Note (Signed)
Doesn't affect him much and no pain Discussed that fibrotic changes probably too bad to consider cortisone injections Will refer to hand surgeon if increased symptoms

## 2021-07-27 NOTE — Assessment & Plan Note (Signed)
BP Readings from Last 3 Encounters:  07/27/21 122/80  05/10/21 130/60  04/23/21 128/68   Good control on lisinopril 10 daily

## 2021-07-27 NOTE — Assessment & Plan Note (Signed)
Lab Results  Component Value Date   HGBA1C 7.7 (A) 07/27/2021   Control is acceptable again Might even go down more On metformin 1000 bid, tresiba 55 units, and farxiga 10

## 2021-07-27 NOTE — Progress Notes (Signed)
Subjective:    Patient ID: Aaron Zimmerman, male    DOB: Dec 12, 1950, 71 y.o.   MRN: [id number]  HPI Here for follow up of poorly controlled diabetes  Mostly recovered from the prostate seeds, etc PSA down to 1.5 (from 8.5) Still feels tired at times --but better Tries to do something every day--painting, work under house, cleaning buildings, etc  Is on the 55 units of insulin Checks sugars daily----72-169 (after birthday cake)  He wonders about his left 5th finger Fixed and flexes----wonders about Rx Doesn't hurt or affect his function  Current Outpatient Medications on File Prior to Visit  Medication Sig Dispense Refill   Accu-Chek Softclix Lancets lancets Use to obtain blood sample to check blood sugar once daily. Dx code E11.59 100 each 12   cetirizine (ZYRTEC) 10 MG tablet Take 1 tablet (10 mg total) by mouth daily as needed for allergies. 90 tablet 3   Cyanocobalamin (B-12 PO) Take 1 tablet by mouth daily.      FARXIGA 10 MG TABS tablet TAKE 1 TABLET BY MOUTH ONCE DAILY 90 tablet 3   glucose blood (ACCU-CHEK GUIDE) test strip Use to check blood sugar once daily. Dx code E11.59 100 each 12   insulin degludec (TRESIBA FLEXTOUCH) 100 UNIT/ML FlexTouch Pen Inject 50 Units into the skin at bedtime. (Patient taking differently: Inject 55 Units into the skin at bedtime.) 45 mL 3   Insulin Pen Needle (PEN NEEDLES) 32G X 6 MM MISC 1 each by Does not apply route daily. 100 each 3   lisinopril (ZESTRIL) 10 MG tablet TAKE 1 TABLET BY MOUTH ONCE A DAY (Patient taking differently: For kidney protection not for htn per pt) 90 tablet 3   metFORMIN (GLUCOPHAGE) 1000 MG tablet TAKE 1 TABLET BY MOUTH TWICE A DAY WITH MEALS 180 tablet 3   Multiple Vitamin (MULTIVITAMIN WITH MINERALS) TABS tablet Take 1 tablet by mouth daily.     silodosin (RAPAFLO) 8 MG CAPS capsule Take 1 capsule (8 mg total) by mouth daily with supper. 30 capsule 2   simvastatin (ZOCOR) 20 MG tablet TAKE ONE TABLET BY MOUTH  EVERY NIGHT AT BEDTIME 90 tablet 3   No current facility-administered medications on file prior to visit.    Allergies  Allergen Reactions   Pioglitazone Rash    Actos rash and hot from waist up    Past Medical History:  Diagnosis Date   Cataract right eye    Complication of anesthesia    slow to awaken after 2021 colonscopy   Diabetes mellitus Type 2    Hyperlipidemia    Personal history of COVID-19 07/2020   weakness & fatigue x 1-2 weeks took oral paxlovid all symptoms resolved   Personal history of urinary calculi 2012   Prostate Cancer ([place])    Seasonal Allergies    Skin abnormality    bottom of left leg scratch healing well pt fell 3 weeks ago per pt on 03-24-2021   Vitamin B12 deficiency    Wears glasses for reading     Past Surgical History:  Procedure Laterality Date   COLONOSCOPY  2021   CYSTOSCOPY N/A 03/30/2021   Procedure: CYSTOSCOPY FLEXIBLE;  Surgeon: Remi Haggard, MD;  Location[facility];  Service: Urology;  Laterality: N/A;  No seeds detected in bladder per Dr. Milford Cage   left eye cataract surgery     2-3 yrs ago per pt on 03-24-2021   POLYPECTOMY     RADIOACTIVE SEED  IMPLANT N/A 03/30/2021   Procedure: RADIOACTIVE SEED IMPLANT/BRACHYTHERAPY IMPLANT;  Surgeon: Remi Haggard, MD;  Location[facility];  Service: Urology;  Laterality: N/A;   SPACE OAR INSTILLATION N/A 03/30/2021   Procedure: SPACE OAR INSTILLATION;  Surgeon: Remi Haggard, MD;  Location: [facility];  Service: Urology;  Laterality: N/A;   traumatic injury and repair of RT hand  1974    Family History  Problem Relation Age of Onset   Heart disease Mother 18       CAD/PCI with stents; cardiomyopathy with AICD   Cancer - Lung Father    Cancer Other        Colon and Prostate   Colon cancer Neg Hx    Colon polyps Neg Hx    Esophageal cancer Neg Hx    Rectal cancer Neg Hx    Stomach cancer Neg Hx     Social History    Socioeconomic History   Marital status: Married    Spouse name: Not on file   Number of children: 2   Years of education: Not on file   Highest education level: Not on file  Occupational History   Occupation: Maintenance-- temperature controls    Employer: [employer]: 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   Occupation: Retired-[place] [employer]  Tobacco Use   Smoking status: Never   Smokeless tobacco: Former    Types: Chew    Quit date: 02/08/1995  Vaping Use   Vaping Use: Never used  Substance and Sexual Activity   Alcohol use: No   Drug use: No   Sexual activity: Yes  Other Topics Concern   Not on file  Social History Narrative   HSG   Married [phone] son 1979 1 daughter 61;    4 grandchildren      No living will   Wife would be health care POA--children are alternates   Would accept resuscitation but no prolonged ventilation or feeding tube if cognitively unaware   Social Determinants of Health   Financial Resource Strain: Not on file  Food Insecurity: Not on file  Transportation Needs: Not on file  Physical Activity: Not on file  Stress: Not on file  Social Connections: Not on file  Intimate Partner Violence: Not on file   Review of Systems Gained back the 10# he lost with the prostate treatment Sleeps well No chest pain or SOB. Did have a myocardial spect treadmill that was fine (in April)    Objective:   Physical Exam Constitutional:      Appearance: Normal appearance.  Cardiovascular:     Rate and Rhythm: Normal rate and regular rhythm.     Pulses: Normal pulses.     Heart sounds: No murmur heard.    No gallop.     Comments: Rare skip Pulmonary:     Effort: Pulmonary effort is normal.     Breath sounds: Normal breath sounds. No wheezing or rales.  Musculoskeletal:     Cervical back: Neck supple.     Right lower leg: No edema.     Left lower leg: No edema.     Comments: Sig flexion contraction in left 5th finger Marked fibrotic changes  along tendons at base of 5th finger  Lymphadenopathy:     Cervical: No cervical adenopathy.  Skin:    Comments: No foot lesions  Neurological:     Mental Status: He is alert.  Assessment & Plan:

## 2021-08-13 DIAGNOSIS — L570 Actinic keratosis: Secondary | ICD-10-CM | POA: Diagnosis not present

## 2021-08-13 DIAGNOSIS — X32XXXD Exposure to sunlight, subsequent encounter: Secondary | ICD-10-CM | POA: Diagnosis not present

## 2021-08-13 DIAGNOSIS — D225 Melanocytic nevi of trunk: Secondary | ICD-10-CM | POA: Diagnosis not present

## 2021-08-13 DIAGNOSIS — L821 Other seborrheic keratosis: Secondary | ICD-10-CM | POA: Diagnosis not present

## 2021-09-08 ENCOUNTER — Encounter: Payer: Self-pay | Admitting: Cardiovascular Disease

## 2021-09-08 ENCOUNTER — Telehealth: Payer: Self-pay | Admitting: Cardiovascular Disease

## 2021-09-08 DIAGNOSIS — H2511 Age-related nuclear cataract, right eye: Secondary | ICD-10-CM | POA: Diagnosis not present

## 2021-09-08 DIAGNOSIS — H269 Unspecified cataract: Secondary | ICD-10-CM | POA: Diagnosis not present

## 2021-09-08 NOTE — Telephone Encounter (Signed)
Per patient's wife, the nurse came out to her after her husband's cataract surgery this morning with an EKG strip and said that he needed checked out with his heart doctor.  The strip report says missed beats.   The patient is in the background and he denies feeling fast or pounding or skipped heartbeats.  He feels his normal self.  I asked her to upload the strip in a my chart message to Dr. Angelena Form for review and then we will call them back.  The pt was last seen in April 2023 and had normal echo and low risk nuclear stress test at that time.

## 2021-09-08 NOTE — Telephone Encounter (Signed)
Pt spouse states that pt has cataract surgery this morning and during the procedure they told her that he had a cardiac episode. She states they didn't say exactly what it was, but they gave her a tape (EKG) and to have her show it to [place].

## 2021-09-08 NOTE — Telephone Encounter (Signed)
Reviewed uploaded rhythm strip with Dr. Angelena Form.  Pt will come tomorrow am for EKG to confirm rhythm. Pt's wife is aware and appreciative for assistance.

## 2021-09-09 ENCOUNTER — Ambulatory Visit (INDEPENDENT_AMBULATORY_CARE_PROVIDER_SITE_OTHER): Payer: Medicare PPO | Admitting: *Deleted

## 2021-09-09 VITALS — BP 130/72 | HR 66 | Wt 222.2 lb

## 2021-09-09 DIAGNOSIS — R9431 Abnormal electrocardiogram [ECG] [EKG]: Secondary | ICD-10-CM

## 2021-09-09 NOTE — Progress Notes (Signed)
   Nurse Visit   Date of Encounter: 09/09/2021 ID: Aaron Zimmerman, DOB 1950/07/10, MRN [id number]  PCP:  Venia Carbon, MD   [facility] Providers Cardiologist:  Lauree Chandler, MD      Visit Details   VS:  BP 130/72 (BP Location: Right Arm, Patient Position: Sitting, Cuff Size: Normal)   Pulse 66   Wt 222 lb 3.2 oz (100.8 kg)   BMI 33.79 kg/m  , BMI Body mass index is 33.79 kg/m.  Wt Readings from Last 3 Encounters:  09/09/21 222 lb 3.2 oz (100.8 kg)  07/27/21 224 lb (101.6 kg)  05/26/21 214 lb (97.1 kg)     Reason for visit: EKG  pt had cataract surgery yesterday and was told to follow up with cardiology based on rhythm strip showing RBBB Performed today: Vitals, EKG, Provider consulted:Dr. Irish Lack, Dr. Angelena Form, and Education Changes (medications, testing, etc.) : None, RBBB today is not new and was present on last EKG in April 2023. Length of Visit: 15 minutes    Medications Adjustments/Labs and Tests Ordered: Orders Placed This Encounter  Procedures   EKG 12-Lead   No orders of the defined types were placed in this encounter.    Alda Lea, RN  09/09/2021 12:14 PM

## 2021-10-05 ENCOUNTER — Other Ambulatory Visit: Payer: Self-pay

## 2021-10-05 NOTE — Patient Outreach (Signed)
[name]) Care Management  10/05/2021  jag lenz [birth year] [id number]   Telephone Screen    Outreach call to patient to introduce [facility] services and assess care needs as part of benefit of PCP office and insurance plan. Spoke with patient. H voices things are going well. Denies any RN CM needs or concerns or need for follow up call.   Main healthcare issue/concern today: Patient states that for the past several months thing have been going well. He had to have prostate surgery earlier this year. However, he has recovered and doing well. Blood sugars controlled and ranging in the 70's-140's. He denies any issues with affording meds. He is able  to drive himself to appts. He remains independent with ADLs/IADLs.   Health Maintenance/Care Gaps: -Last AWV: Appt scheduled for 02/01/22   Aaron Montgomery, RN,BSN,CCM [place] [facility] Telephonic Care Management Coordinator Direct Phone: [phone] Toll Free: [phone] Fax: [phone]

## 2021-10-09 ENCOUNTER — Other Ambulatory Visit: Payer: Self-pay | Admitting: Internal Medicine

## 2021-12-20 DIAGNOSIS — C61 Malignant neoplasm of prostate: Secondary | ICD-10-CM | POA: Diagnosis not present

## 2021-12-27 DIAGNOSIS — N200 Calculus of kidney: Secondary | ICD-10-CM | POA: Diagnosis not present

## 2021-12-27 DIAGNOSIS — R1084 Generalized abdominal pain: Secondary | ICD-10-CM | POA: Diagnosis not present

## 2021-12-27 DIAGNOSIS — C61 Malignant neoplasm of prostate: Secondary | ICD-10-CM | POA: Diagnosis not present

## 2022-02-01 ENCOUNTER — Ambulatory Visit (INDEPENDENT_AMBULATORY_CARE_PROVIDER_SITE_OTHER): Payer: Medicare PPO | Admitting: Internal Medicine

## 2022-02-01 ENCOUNTER — Encounter: Payer: Self-pay | Admitting: Internal Medicine

## 2022-02-01 VITALS — BP 118/78 | HR 77 | Temp 97.4°F | Ht 68.5 in | Wt 223.0 lb

## 2022-02-01 DIAGNOSIS — Z0000 Encounter for general adult medical examination without abnormal findings: Secondary | ICD-10-CM | POA: Diagnosis not present

## 2022-02-01 DIAGNOSIS — Z23 Encounter for immunization: Secondary | ICD-10-CM | POA: Diagnosis not present

## 2022-02-01 DIAGNOSIS — E1159 Type 2 diabetes mellitus with other circulatory complications: Secondary | ICD-10-CM

## 2022-02-01 DIAGNOSIS — I700 Atherosclerosis of aorta: Secondary | ICD-10-CM | POA: Diagnosis not present

## 2022-02-01 DIAGNOSIS — E538 Deficiency of other specified B group vitamins: Secondary | ICD-10-CM

## 2022-02-01 DIAGNOSIS — I10 Essential (primary) hypertension: Secondary | ICD-10-CM | POA: Diagnosis not present

## 2022-02-01 DIAGNOSIS — C61 Malignant neoplasm of prostate: Secondary | ICD-10-CM

## 2022-02-01 DIAGNOSIS — R1319 Other dysphagia: Secondary | ICD-10-CM

## 2022-02-01 LAB — CBC
HCT: 49.8 % (ref 39.0–52.0)
Hemoglobin: 16.4 g/dL (ref 13.0–17.0)
MCHC: 33 g/dL (ref 30.0–36.0)
MCV: 85.9 fl (ref 78.0–100.0)
Platelets: 198 10*3/uL (ref 150.0–400.0)
RBC: 5.79 Mil/uL (ref 4.22–5.81)
RDW: 12.9 % (ref 11.5–15.5)
WBC: 5 10*3/uL (ref 4.0–10.5)

## 2022-02-01 LAB — LIPID PANEL
Cholesterol: 116 mg/dL (ref 0–200)
HDL: 34.2 mg/dL — ABNORMAL LOW (ref >39.00)
LDL Cholesterol: 60 mg/dL (ref 0–99)
NonHDL: 82.03
Total CHOL/HDL Ratio: 3
Triglycerides: 110 mg/dL (ref 0.0–149.0)
VLDL: 22 mg/dL (ref 0.0–40.0)

## 2022-02-01 LAB — COMPREHENSIVE METABOLIC PANEL
ALT: 25 U/L (ref 0–53)
AST: 22 U/L (ref 0–37)
Albumin: 4.7 g/dL (ref 3.5–5.2)
Alkaline Phosphatase: 56 U/L (ref 39–117)
BUN: 14 mg/dL (ref 6–23)
CO2: 30 mEq/L (ref 19–32)
Calcium: 9.6 mg/dL (ref 8.4–10.5)
Chloride: 104 mEq/L (ref 96–112)
Creatinine, Ser: 0.9 mg/dL (ref 0.40–1.50)
GFR: 87.32 mL/min (ref >60.00)
Glucose, Bld: 136 mg/dL — ABNORMAL HIGH (ref 70–99)
Potassium: 4.5 mEq/L (ref 3.5–5.1)
Sodium: 142 mEq/L (ref 135–145)
Total Bilirubin: 0.6 mg/dL (ref 0.2–1.2)
Total Protein: 7.5 g/dL (ref 6.0–8.3)

## 2022-02-01 LAB — HEMOGLOBIN A1C: Hgb A1c MFr Bld: 8 % — ABNORMAL HIGH (ref 4.6–6.5)

## 2022-02-01 LAB — MICROALBUMIN / CREATININE URINE RATIO
Creatinine,U: 58.9 mg/dL
Microalb Creat Ratio: 1.2 mg/g (ref 0.0–30.0)
Microalb, Ur: <0.7 mg/dL (ref 0.0–1.9)

## 2022-02-01 LAB — HM DIABETES FOOT EXAM

## 2022-02-01 LAB — VITAMIN B12: Vitamin B-12: 170 pg/mL — ABNORMAL LOW (ref 211–911)

## 2022-02-01 MED ORDER — OMEPRAZOLE 20 MG PO CPDR: 20.0000 mg | DELAYED_RELEASE_CAPSULE | Freq: Every day | ORAL | 3 refills | Status: DC

## 2022-02-01 NOTE — Assessment & Plan Note (Signed)
BP Readings from Last 3 Encounters:  02/01/22 118/78  09/09/21 130/72  07/27/21 122/80   Good control on lisinopril '10mg'$  daily

## 2022-02-01 NOTE — Assessment & Plan Note (Signed)
I have personally reviewed the Medicare Annual Wellness questionnaire and have noted 1. The patient's medical and social history 2. Their use of alcohol, tobacco or illicit drugs 3. Their current medications and supplements 4. The patient's functional ability including ADL's, fall risks, home safety risks and hearing or visual             impairment. 5. Diet and physical activities 6. Evidence for depression or mood disorders  The patients weight, height, BMI and visual acuity have been recorded in the chart I have made referrals, counseling and provided education to the patient based review of the above and I have provided the pt with a written personalized care plan for preventive services.  I have provided you with a copy of your personalized plan for preventive services. Please take the time to review along with your updated medication list.  Recommended updated COVID vaccine Flu vaccine today Discussed lifestyle  Colon due 2027

## 2022-02-01 NOTE — Assessment & Plan Note (Signed)
Seems to have reasonable control on metformin 1000 bid, tresiba 55 daily and farxiga 10

## 2022-02-01 NOTE — Assessment & Plan Note (Signed)
Takes B12 intermittently

## 2022-02-01 NOTE — Assessment & Plan Note (Signed)
PSA down to 0.7 Will need lupron if PSA rises

## 2022-02-01 NOTE — Patient Instructions (Signed)
Please start the omeprazole '20mg'$  daily on an empty stomach. If the swallowing issue goes away, you can wean off the omeprazole slowly over several weeks. If the symptoms continue, let me know and I will set you up with a GI doctor

## 2022-02-01 NOTE — Assessment & Plan Note (Signed)
On imaging Is on simvastatin 20 daily

## 2022-02-01 NOTE — Assessment & Plan Note (Signed)
Discussed trying omeprazole '20mg'$  daily If persists, will set up with GI

## 2022-02-01 NOTE — Progress Notes (Signed)
Subjective:    Patient ID: Aaron Zimmerman, male    DOB: 1950/06/29, 71 y.o.   MRN: [id number]  HPI Here for Medicare wellness visit and follow up of chronic health conditions Reviewed advanced directives Reviewed other doctors---Dr McAlhany--cardiologist, Dr Mont Dutton oncology, Dr Ames Dura, Dr Marta Lamas, Dermatologist on [place] No hospitalizations. 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 No depression or anhedonia Vision is fine---needs reading glasses since cataract surgery Hearing is not great---discussed getting audiology evaluation Independent with instrumental ADLs No memory problems  Seems to notice some ongoing problems with swallowing Food and liquids seem to get caught at times Tends to choke easily No water brash or heartburn (other than with a belch) Has eaten late at times---mostly due to the holidays  Sugars have been reasonable Checks every morning----usually under 130-140  Continues with Dr Tammi Klippel Last PSA was 0.7--this is better (seed implant present) Still just on observation for now Voids okay--is on the rapafo (tried to wean off it and couldn't--but okay at every other day)  Known aortic atherosclerosis  Does okay with cetirizine during allergy season  No chest pain or SOB Does keep up with cardiologist No dizziness or syncope No edema No palpitations--other than notices heart during exercise  Current Outpatient Medications on File Prior to Visit  Medication Sig Dispense Refill   Accu-Chek Softclix Lancets lancets Use to obtain blood sample to check blood sugar once daily. Dx code E11.59 100 each 12   cetirizine (ZYRTEC) 10 MG tablet Take 1 tablet (10 mg total) by mouth daily as needed for allergies. 90 tablet 3   FARXIGA 10 MG TABS tablet TAKE 1 TABLET BY MOUTH ONCE DAILY 90 tablet 3    glucose blood (ACCU-CHEK GUIDE) test strip Use to check blood sugar once daily. Dx code E11.59 100 each 12   insulin degludec (TRESIBA FLEXTOUCH) 100 UNIT/ML FlexTouch Pen Inject 55 Units into the skin at bedtime. 1 mL 0   Insulin Pen Needle (PEN NEEDLES) 32G X 6 MM MISC 1 each by Does not apply route daily. 100 each 3   lisinopril (ZESTRIL) 10 MG tablet TAKE 1 TABLET BY MOUTH ONCE A DAY (Patient taking differently: For kidney protection not for htn per pt) 90 tablet 3   metFORMIN (GLUCOPHAGE) 1000 MG tablet TAKE 1 TABLET BY MOUTH TWICE A DAY WITH MEALS 180 tablet 3   Multiple Vitamin (MULTIVITAMIN WITH MINERALS) TABS tablet Take 1 tablet by mouth daily.     silodosin (RAPAFLO) 8 MG CAPS capsule Take 1 capsule (8 mg total) by mouth daily with supper. 30 capsule 2   simvastatin (ZOCOR) 20 MG tablet TAKE ONE TABLET BY MOUTH EVERY NIGHT AT BEDTIME 90 tablet 3   No current facility-administered medications on file prior to visit.    Allergies  Allergen Reactions   Pioglitazone Rash    Actos rash and hot from waist up    Past Medical History:  Diagnosis Date   Cataract right eye    Complication of anesthesia    slow to awaken after 2021 colonscopy   Diabetes mellitus Type 2    Hyperlipidemia    Personal history of COVID-19 07/2020   weakness & fatigue x 1-2 weeks took oral paxlovid all symptoms resolved   Personal history of urinary calculi 2012   Prostate Cancer ([place])    Seasonal Allergies    Skin abnormality  bottom of left leg scratch healing well pt fell 3 weeks ago per pt on 03-24-2021   Vitamin B12 deficiency    Wears glasses for reading     Past Surgical History:  Procedure Laterality Date   CATARACT EXTRACTION W/ INTRAOCULAR LENS IMPLANT Right 2023   COLONOSCOPY  2021   CYSTOSCOPY N/A 03/30/2021   Procedure: CYSTOSCOPY FLEXIBLE;  Surgeon: Remi Haggard, MD;  Location[facility];  Service: Urology;  Laterality: N/A;  No seeds detected in bladder per  Dr. Milford Cage   left eye cataract surgery     2-3 yrs ago per pt on 03-24-2021   POLYPECTOMY     RADIOACTIVE SEED IMPLANT N/A 03/30/2021   Procedure: RADIOACTIVE SEED IMPLANT/BRACHYTHERAPY IMPLANT;  Surgeon: Remi Haggard, MD;  Location: [facility];  Service: Urology;  Laterality: N/A;   SPACE OAR INSTILLATION N/A 03/30/2021   Procedure: SPACE OAR INSTILLATION;  Surgeon: Remi Haggard, MD;  Location: [facility];  Service: Urology;  Laterality: N/A;   traumatic injury and repair of RT hand  1974    Family History  Problem Relation Age of Onset   Heart disease Mother 28       CAD/PCI with stents; cardiomyopathy with AICD   Cancer - Lung Father    Cancer Other        Colon and Prostate   Colon cancer Neg Hx    Colon polyps Neg Hx    Esophageal cancer Neg Hx    Rectal cancer Neg Hx    Stomach cancer Neg Hx     Social History   Socioeconomic History   Marital status: Married    Spouse name: Not on file   Number of children: 2   Years of education: Not on file   Highest education level: Not on file  Occupational History   Occupation: Maintenance-- temperature controls    Employer: [employer]: 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   Occupation: Retired-[place] [employer]  Tobacco Use   Smoking status: Never   Smokeless tobacco: Former    Types: Chew    Quit date: 02/08/1995  Vaping Use   Vaping Use: Never used  Substance and Sexual Activity   Alcohol use: No   Drug use: No   Sexual activity: Yes  Other Topics Concern   Not on file  Social History Narrative   HSG   Married [phone] son 1979 1 daughter 73;    4 grandchildren      No living will   Wife would be health care POA--children are alternates   Would accept resuscitation but no prolonged ventilation or feeding tube if cognitively unaware   Social Determinants of Health   Financial Resource Strain: Not on file  Food Insecurity: No Food Insecurity (10/05/2021)   Hunger  Vital Sign    Worried About Running Out of Food in the Last Year: Never true    [place] in the Last Year: Never true  Transportation Needs: No Transportation Needs (10/05/2021)   PRAPARE - Hydrologist (Medical): No    Lack of Transportation (Non-Medical): No  Physical Activity: Not on file  Stress: Not on file  Social Connections: Not on file  Intimate Partner Violence: Not on file   Review of Systems Appetite is good Weight is fairly stable Sleeps well Wears seat belt Teeth are fine--keeps up with dentist Several non cancerous lesions removed from skin recently Bowels  move fine--no blood No sig back or joint pains    Objective:   Physical Exam Constitutional:      Appearance: Normal appearance.  HENT:     Mouth/Throat:     Comments: No lesions Eyes:     Conjunctiva/sclera: Conjunctivae normal.     Pupils: Pupils are equal, round, and reactive to light.  Cardiovascular:     Rate and Rhythm: Normal rate and regular rhythm.     Pulses: Normal pulses.     Heart sounds: No murmur heard.    No gallop.  Pulmonary:     Effort: Pulmonary effort is normal.     Breath sounds: Normal breath sounds. No wheezing or rales.  Abdominal:     Palpations: Abdomen is soft.     Tenderness: There is no abdominal tenderness.  Musculoskeletal:     Cervical back: Neck supple.     Right lower leg: No edema.  Lymphadenopathy:     Cervical: No cervical adenopathy.  Skin:    Findings: No lesion or rash.     Comments: No foot lesions  Neurological:     General: No focal deficit present.     Mental Status: He is alert and oriented to person, place, and time.     Comments: Mini-cog normal Normal sensation in feet  Psychiatric:        Mood and Affect: Mood normal.        Behavior: Behavior normal.            Assessment & Plan:

## 2022-02-01 NOTE — Progress Notes (Signed)
Hearing Screening - Comments:: Did not pass whisper test Vision Screening - Comments:: October 2023

## 2022-02-14 DIAGNOSIS — H04123 Dry eye syndrome of bilateral lacrimal glands: Secondary | ICD-10-CM | POA: Diagnosis not present

## 2022-02-15 ENCOUNTER — Telehealth: Payer: Self-pay | Admitting: Internal Medicine

## 2022-02-15 NOTE — Telephone Encounter (Signed)
Patient would like a phone call with advice as to what otc cough and decongestant medication he can take for a diabetic

## 2022-02-15 NOTE — Telephone Encounter (Signed)
Spoke to pt

## 2022-02-23 ENCOUNTER — Encounter: Payer: Self-pay | Admitting: Internal Medicine

## 2022-02-23 ENCOUNTER — Ambulatory Visit: Payer: Medicare PPO | Admitting: Internal Medicine

## 2022-02-23 VITALS — BP 122/64 | HR 62 | Temp 97.6°F | Ht 68.5 in | Wt 217.0 lb

## 2022-02-23 DIAGNOSIS — J069 Acute upper respiratory infection, unspecified: Secondary | ICD-10-CM | POA: Diagnosis not present

## 2022-02-23 NOTE — Progress Notes (Signed)
Subjective:    Patient ID: Aaron Zimmerman, male    DOB: 10-14-1950, 72 y.o.   MRN: [id number]  HPI Here due to respiratory infection  Started about a week ago "Going downhill since then" May be some better today Cough is really bad when lying down Some body aches and chills---some headache Did have low grade fever Some nasal drainage---better now No ear pain No SOB  Wife tested positive for COVID and flu yesterday  Taking tylenol Robitussin DM helped the cough (tea with honey also)  Current Outpatient Medications on File Prior to Visit  Medication Sig Dispense Refill   Accu-Chek Softclix Lancets lancets Use to obtain blood sample to check blood sugar once daily. Dx code E11.59 100 each 12   cetirizine (ZYRTEC) 10 MG tablet Take 1 tablet (10 mg total) by mouth daily as needed for allergies. 90 tablet 3   FARXIGA 10 MG TABS tablet TAKE 1 TABLET BY MOUTH ONCE DAILY 90 tablet 3   glucose blood (ACCU-CHEK GUIDE) test strip Use to check blood sugar once daily. Dx code E11.59 100 each 12   insulin degludec (TRESIBA FLEXTOUCH) 100 UNIT/ML FlexTouch Pen Inject 55 Units into the skin at bedtime. 1 mL 0   Insulin Pen Needle (PEN NEEDLES) 32G X 6 MM MISC 1 each by Does not apply route daily. 100 each 3   lisinopril (ZESTRIL) 10 MG tablet TAKE 1 TABLET BY MOUTH ONCE A DAY (Patient taking differently: For kidney protection not for htn per pt) 90 tablet 3   metFORMIN (GLUCOPHAGE) 1000 MG tablet TAKE 1 TABLET BY MOUTH TWICE A DAY WITH MEALS 180 tablet 3   Multiple Vitamin (MULTIVITAMIN WITH MINERALS) TABS tablet Take 1 tablet by mouth daily.     omeprazole (PRILOSEC) 20 MG capsule Take 1 capsule (20 mg total) by mouth daily. 90 capsule 3   silodosin (RAPAFLO) 8 MG CAPS capsule Take 1 capsule (8 mg total) by mouth daily with supper. 30 capsule 2   simvastatin (ZOCOR) 20 MG tablet TAKE ONE TABLET BY MOUTH EVERY NIGHT AT BEDTIME 90 tablet 3   No current facility-administered medications on  file prior to visit.    Allergies  Allergen Reactions   Pioglitazone Rash    Actos rash and hot from waist up    Past Medical History:  Diagnosis Date   Cataract right eye    Complication of anesthesia    slow to awaken after 2021 colonscopy   Diabetes mellitus Type 2    Hyperlipidemia    Personal history of COVID-19 07/2020   weakness & fatigue x 1-2 weeks took oral paxlovid all symptoms resolved   Personal history of urinary calculi 2012   Prostate Cancer ([place])    Seasonal Allergies    Skin abnormality    bottom of left leg scratch healing well pt fell 3 weeks ago per pt on 03-24-2021   Vitamin B12 deficiency    Wears glasses for reading     Past Surgical History:  Procedure Laterality Date   CATARACT EXTRACTION W/ INTRAOCULAR LENS IMPLANT Right 2023   COLONOSCOPY  2021   CYSTOSCOPY N/A 03/30/2021   Procedure: CYSTOSCOPY FLEXIBLE;  Surgeon: Remi Haggard, MD;  Location: [facility];  Service: Urology;  Laterality: N/A;  No seeds detected in bladder per Dr. Milford Cage   left eye cataract surgery     2-3 yrs ago per pt on 03-24-2021   POLYPECTOMY     RADIOACTIVE SEED IMPLANT N/A  03/30/2021   Procedure: RADIOACTIVE SEED IMPLANT/BRACHYTHERAPY IMPLANT;  Surgeon: Remi Haggard, MD;  Location: [facility];  Service: Urology;  Laterality: N/A;   SPACE OAR INSTILLATION N/A 03/30/2021   Procedure: SPACE OAR INSTILLATION;  Surgeon: Remi Haggard, MD;  Location: [facility];  Service: Urology;  Laterality: N/A;   traumatic injury and repair of RT hand  1974    Family History  Problem Relation Age of Onset   Heart disease Mother 63       CAD/PCI with stents; cardiomyopathy with AICD   Cancer - Lung Father    Cancer Other        Colon and Prostate   Colon cancer Neg Hx    Colon polyps Neg Hx    Esophageal cancer Neg Hx    Rectal cancer Neg Hx    Stomach cancer Neg Hx     Social History   Socioeconomic History    Marital status: Married    Spouse name: Not on file   Number of children: 2   Years of education: Not on file   Highest education level: Not on file  Occupational History   Occupation: Maintenance-- temperature controls    Employer: [employer]: 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   Occupation: Retired-[place] [employer]  Tobacco Use   Smoking status: Never    Passive exposure: Never   Smokeless tobacco: Former    Types: Chew    Quit date: 02/08/1995  Vaping Use   Vaping Use: Never used  Substance and Sexual Activity   Alcohol use: No   Drug use: No   Sexual activity: Yes  Other Topics Concern   Not on file  Social History Narrative   HSG   Married [phone] son 1979 1 daughter 52;    4 grandchildren      No living will   Wife would be health care POA--children are alternates   Would accept resuscitation but no prolonged ventilation or feeding tube if cognitively unaware   Social Determinants of Health   Financial Resource Strain: Not on file  Food Insecurity: No Food Insecurity (10/05/2021)   Hunger Vital Sign    Worried About Running Out of Food in the Last Year: Never true    [place] in the Last Year: Never true  Transportation Needs: No Transportation Needs (10/05/2021)   PRAPARE - Hydrologist (Medical): No    Lack of Transportation (Non-Medical): No  Physical Activity: Not on file  Stress: Not on file  Social Connections: Not on file  Intimate Partner Violence: Not on file   Review of Systems No N/V Appetite is off--things don't taste right Drinking fine     Objective:   Physical Exam Constitutional:      Appearance: Normal appearance.  HENT:     Head:     Comments: Mild frontal tenderness    Right Ear: Tympanic membrane and ear canal normal.     Left Ear: Tympanic membrane and ear canal normal.     Mouth/Throat:     Pharynx: No oropharyngeal exudate or posterior oropharyngeal erythema.  Pulmonary:     Effort:  Pulmonary effort is normal.     Breath sounds: Normal breath sounds. No wheezing or rales.  Musculoskeletal:     Cervical back: Neck supple.  Lymphadenopathy:     Cervical: No cervical adenopathy.  Neurological:     Mental Status: He is alert.  Assessment & Plan:

## 2022-02-23 NOTE — Assessment & Plan Note (Signed)
May be attenuated flu Too late for antivirals Some sinus symptoms Continue supportive care (tylenol, DM) If worsens in the next couple of days, would start doxy 100 bid x 7 days

## 2022-02-28 ENCOUNTER — Other Ambulatory Visit: Payer: Self-pay | Admitting: Internal Medicine

## 2022-05-04 ENCOUNTER — Other Ambulatory Visit: Payer: Self-pay | Admitting: Internal Medicine

## 2022-06-20 DIAGNOSIS — C61 Malignant neoplasm of prostate: Secondary | ICD-10-CM | POA: Diagnosis not present

## 2022-06-22 ENCOUNTER — Other Ambulatory Visit: Payer: Self-pay | Admitting: Internal Medicine

## 2022-06-22 MED ORDER — ACCU-CHEK SOFTCLIX LANCETS MISC: 5 refills | Status: DC

## 2022-06-22 MED ORDER — ACCU-CHEK GUIDE VI STRP: ORAL_STRIP | 5 refills | Status: DC

## 2022-06-22 NOTE — Addendum Note (Signed)
Addended by: Sydell Axon C on: 06/22/2022 03:48 PM   Modules accepted: Orders

## 2022-06-22 NOTE — Telephone Encounter (Signed)
Prescriptions printed after changing to normal. Called refills in to [place] at [facility]

## 2022-06-27 DIAGNOSIS — N401 Enlarged prostate with lower urinary tract symptoms: Secondary | ICD-10-CM | POA: Diagnosis not present

## 2022-06-27 DIAGNOSIS — C61 Malignant neoplasm of prostate: Secondary | ICD-10-CM | POA: Diagnosis not present

## 2022-06-27 DIAGNOSIS — R3912 Poor urinary stream: Secondary | ICD-10-CM | POA: Diagnosis not present

## 2022-07-05 ENCOUNTER — Other Ambulatory Visit: Payer: Self-pay | Admitting: Internal Medicine

## 2022-07-07 ENCOUNTER — Other Ambulatory Visit: Payer: Self-pay | Admitting: Internal Medicine

## 2022-08-03 ENCOUNTER — Ambulatory Visit: Payer: Medicare PPO | Admitting: Internal Medicine

## 2022-08-16 ENCOUNTER — Encounter: Payer: Self-pay | Admitting: Internal Medicine

## 2022-08-16 ENCOUNTER — Ambulatory Visit: Payer: Medicare PPO | Admitting: Internal Medicine

## 2022-08-16 VITALS — BP 112/60 | HR 66 | Temp 97.7°F | Ht 68.5 in | Wt 219.0 lb

## 2022-08-16 DIAGNOSIS — E538 Deficiency of other specified B group vitamins: Secondary | ICD-10-CM | POA: Diagnosis not present

## 2022-08-16 DIAGNOSIS — Z794 Long term (current) use of insulin: Secondary | ICD-10-CM

## 2022-08-16 DIAGNOSIS — R1319 Other dysphagia: Secondary | ICD-10-CM | POA: Diagnosis not present

## 2022-08-16 DIAGNOSIS — C61 Malignant neoplasm of prostate: Secondary | ICD-10-CM | POA: Diagnosis not present

## 2022-08-16 DIAGNOSIS — I10 Essential (primary) hypertension: Secondary | ICD-10-CM

## 2022-08-16 DIAGNOSIS — E1159 Type 2 diabetes mellitus with other circulatory complications: Secondary | ICD-10-CM

## 2022-08-16 LAB — POCT GLYCOSYLATED HEMOGLOBIN (HGB A1C): Hemoglobin A1C: 7.6 % — AB (ref 4.0–5.6)

## 2022-08-16 NOTE — Assessment & Plan Note (Signed)
PSA down ot 0.6 Seeds in place

## 2022-08-16 NOTE — Assessment & Plan Note (Signed)
BP Readings from Last 3 Encounters:  08/16/22 112/60  02/23/22 122/64  02/01/22 118/78   Controlled with the lsinopril 10mg  daily

## 2022-08-16 NOTE — Assessment & Plan Note (Signed)
Lab Results  Component Value Date   HGBA1C 7.6 (A) 08/16/2022   A1c actually better despite the higher sugars Will have pharmacist contact him about the insulin injections--now at 54 daily of tresiba Farxiga 5 daily, metformin 1000 bid

## 2022-08-16 NOTE — Assessment & Plan Note (Signed)
Resolved with the omeprazole Will continue

## 2022-08-16 NOTE — Assessment & Plan Note (Signed)
Is back on the B12 daily Will recheck at wellness visit

## 2022-08-16 NOTE — Progress Notes (Signed)
Subjective:    Patient ID: Aaron Zimmerman, male    DOB: June 01, 1950, 72 y.o.   MRN: [id number]  HPI Here for follow up of diabetes and other medical conditions  Sugars have been up for the past 5 weeks--up in 180's at times Before that--was 120's mostly No change in eating--fairly stable Wonders if the insulin is not getting in properly No really exercising in the heat--but stays active  Did resume the B12 daily  Dysphagia is gone No cough Continues on the omeprazole  Current Outpatient Medications on File Prior to Visit  Medication Sig Dispense Refill   Accu-Chek Softclix Lancets lancets USE TO CHECK BLOOD SUGAR DAILY Dx E11.59 100 each 5   cetirizine (ZYRTEC) 10 MG tablet Take 1 tablet (10 mg total) by mouth daily as needed for allergies. 90 tablet 3   FARXIGA 10 MG TABS tablet TAKE ONE TABLET BY MOUTH ONCE DAILY 90 tablet 3   glucose blood (ACCU-CHEK GUIDE) test strip USE TO CHECK BLOOD SUGAR ONCE DAILY Dx E11.59 100 strip 5   Insulin Pen Needle (PEN NEEDLES) 32G X 6 MM MISC 1 each by Does not apply route daily. 100 each 3   lisinopril (ZESTRIL) 10 MG tablet TAKE 1 TABLET BY MOUTH ONCE A DAY 90 tablet 3   metFORMIN (GLUCOPHAGE) 1000 MG tablet TAKE 1 TABLET BY MOUTH TWICE A DAY WITH MEALS 180 tablet 3   Multiple Vitamin (MULTIVITAMIN WITH MINERALS) TABS tablet Take 1 tablet by mouth daily.     omeprazole (PRILOSEC) 20 MG capsule Take 1 capsule (20 mg total) by mouth daily. 90 capsule 3   silodosin (RAPAFLO) 8 MG CAPS capsule Take 1 capsule (8 mg total) by mouth daily with supper. 30 capsule 2   simvastatin (ZOCOR) 20 MG tablet TAKE ONE TABLET BY MOUTH EVERY NIGHT AT BEDTIME 90 tablet 3   TRESIBA FLEXTOUCH 100 UNIT/ML FlexTouch Pen INJECT 50 UNITS INTO THE SKIN DAILY 45 mL 3   No current facility-administered medications on file prior to visit.    Allergies  Allergen Reactions   Pioglitazone Rash    Actos rash and hot from waist up    Past Medical History:   Diagnosis Date   Cataract right eye    Complication of anesthesia    slow to awaken after 2021 colonscopy   Diabetes mellitus Type 2    Hyperlipidemia    Personal history of COVID-19 07/2020   weakness & fatigue x 1-2 weeks took oral paxlovid all symptoms resolved   Personal history of urinary calculi 2012   Prostate Cancer ([facility])    Seasonal Allergies    Skin abnormality    bottom of left leg scratch healing well pt fell 3 weeks ago per pt on 03-24-2021   Vitamin B12 deficiency    Wears glasses for reading     Past Surgical History:  Procedure Laterality Date   CATARACT EXTRACTION W/ INTRAOCULAR LENS IMPLANT Right 2023   COLONOSCOPY  2021   CYSTOSCOPY N/A 03/30/2021   Procedure: CYSTOSCOPY FLEXIBLE;  Surgeon: Belva Agee, MD;  Location: [facility];  Service: Urology;  Laterality: N/A;  No seeds detected in bladder per Dr. Benancio Deeds   left eye cataract surgery     2-3 yrs ago per pt on 03-24-2021   POLYPECTOMY     RADIOACTIVE SEED IMPLANT N/A 03/30/2021   Procedure: RADIOACTIVE SEED IMPLANT/BRACHYTHERAPY IMPLANT;  Surgeon: Belva Agee, MD;  Location: [facility];  Service: Urology;  Laterality: N/A;   SPACE OAR INSTILLATION N/A 03/30/2021   Procedure: SPACE OAR INSTILLATION;  Surgeon: Belva Agee, MD;  Location: [facility];  Service: Urology;  Laterality: N/A;   traumatic injury and repair of RT hand  1974    Family History  Problem Relation Age of Onset   Heart disease Mother 5       CAD/PCI with stents; cardiomyopathy with AICD   Cancer - Lung Father    Cancer Other        Colon and Prostate   Colon cancer Neg Hx    Colon polyps Neg Hx    Esophageal cancer Neg Hx    Rectal cancer Neg Hx    Stomach cancer Neg Hx     Social History   Socioeconomic History   Marital status: Married    Spouse name: Not on file   Number of children: 2   Years of education: Not on file   Highest education level: Not on  file  Occupational History   Occupation: Maintenance-- temperature controls    Employer: [employer]    Comment: 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   Occupation: Retired-[place] [employer]  Tobacco Use   Smoking status: Never    Passive exposure: Never   Smokeless tobacco: Former    Types: Chew    Quit date: 02/08/1995  Vaping Use   Vaping Use: Never used  Substance and Sexual Activity   Alcohol use: No   Drug use: No   Sexual activity: Yes  Other Topics Concern   Not on file  Social History Narrative   HSG   Married [id number] son 58 1 daughter 46;    4 grandchildren      No living will   Wife would be health care POA--children are alternates   Would accept resuscitation but no prolonged ventilation or feeding tube if cognitively unaware   Social Determinants of Health   Financial Resource Strain: Not on file  Food Insecurity: No Food Insecurity (10/05/2021)   Hunger Vital Sign    Worried About Running Out of Food in the Last Year: Never true    Ran Out of Food in the Last Year: Never true  Transportation Needs: No Transportation Needs (10/05/2021)   PRAPARE - Administrator, Civil Service (Medical): No    Lack of Transportation (Non-Medical): No  Physical Activity: Not on file  Stress: Not on file  Social Connections: Not on file  Intimate Partner Violence: Not on file   Review of Systems Weight is about the same Sleeps okay    Objective:   Physical Exam Constitutional:      Appearance: Normal appearance.  Cardiovascular:     Rate and Rhythm: Normal rate and regular rhythm.     Pulses: Normal pulses.     Heart sounds: No murmur heard.    No gallop.  Pulmonary:     Effort: Pulmonary effort is normal.     Breath sounds: Normal breath sounds. No wheezing or rales.  Musculoskeletal:     Cervical back: Neck supple.     Right lower leg: No edema.     Left lower leg: No edema.  Lymphadenopathy:     Cervical: No cervical adenopathy.  Neurological:      Mental Status: He is alert.  Psychiatric:        Mood and Affect: Mood normal.        Behavior: Behavior normal.  Assessment & Plan:

## 2022-08-18 ENCOUNTER — Telehealth: Payer: Self-pay

## 2022-08-18 NOTE — Progress Notes (Signed)
   Care Guide Note  08/18/2022 Name: Aaron Zimmerman MRN: [id number] DOB: 20-Nov-1950  Referred by: Karie Schwalbe, MD Reason for referral : Care Coordination (Outreach to schedule with pharm d )   Aaron Zimmerman is a 71 y.o. year old male who is a primary care patient of Alphonsus Sias, Berneda Rose, MD. Angela Adam was referred to the pharmacist for assistance related to DM.    Successful contact was made with the patient to discuss pharmacy services including being ready for the pharmacist to call at least 5 minutes before the scheduled appointment time, to have medication bottles and any blood sugar or blood pressure readings ready for review. The patient agreed to meet with the pharmacist via with the pharmacist via telephone visit on (date/time).  09/22/2022   Penne Lash, RMA Care Guide [facility]  [place], [place] [address] Direct Dial: [phone] [email or web address]

## 2022-08-23 ENCOUNTER — Telehealth: Payer: Self-pay | Admitting: Internal Medicine

## 2022-08-23 NOTE — Telephone Encounter (Signed)
Patient called in and stated that he needs to change his appointment with the pharmacist. He stated that he will be traveling. Thank you!

## 2022-09-22 ENCOUNTER — Other Ambulatory Visit: Payer: Medicare PPO | Admitting: Pharmacist

## 2022-09-27 ENCOUNTER — Other Ambulatory Visit: Payer: Medicare PPO | Admitting: Pharmacist

## 2022-09-27 MED ORDER — OZEMPIC (0.25 OR 0.5 MG/DOSE) 2 MG/3ML ~~LOC~~ SOPN: PEN_INJECTOR | SUBCUTANEOUS | Status: DC

## 2022-09-27 NOTE — Patient Instructions (Signed)
Chanetta Marshall,   It was great talking to you today!  We will help apply for manufacturer assistance from [employer] for [place].   Continue your current regimen for now. Once we receive the Ozempic, we can talk about how to adjust your Tresiba dose.   Thanks!  Catie Eppie Gibson, PharmD, BCACP, CPP Clinical Pharmacist [facility] [phone]

## 2022-09-27 NOTE — Progress Notes (Signed)
09/27/2022 Name: Aaron Zimmerman MRN: [id number] DOB: Dec 15, 1950  Chief Complaint  Patient presents with   Medication Management   Diabetes    Aaron Zimmerman is a 72 y.o. year old male who presented for a telephone visit.   They were referred to the pharmacist by their PCP for assistance in managing diabetes.    Subjective:  Care Team: Primary Care Provider: Karie Schwalbe, MD ; Next Scheduled Visit:   Medication Access/Adherence  Current Pharmacy:  [facility] - [place], [place] - [phone], [address] [place] [address] Phone: [phone] Fax: [phone]  [facility] - [place], [place] - [address] [place] [address] Phone: [phone] Fax: [phone]  [id number] - [place], [place] - [address] [place] [address] Phone: [phone] Fax: [phone]   Patient reports affordability concerns with their medications: No  Patient reports access/transportation concerns to their pharmacy: No  Patient reports adherence concerns with their medications:  No    Reports he is recovering from prostate cancer.   Diabetes:  Current medications: metformin 1000 mg twice daily, Tresiba 55 units daily, Farxiga 10 mg daily  Current glucose readings: fasting: 87-150  Patient reports periodic hypoglycemic s/sx including dizziness, shakiness, sweating.  Reports he has been trying to reduce snacking/eating after 7 pm.  Breakfast:  Lunch:  Supper:   Hypertension:  Current medications: lisinopril 10 mg daily   Hyperlipidemia/ASCVD Risk Reduction  Current lipid lowering medications: simvastatin 20 mg    Objective:  Lab Results  Component Value Date   HGBA1C 7.6 (A) 08/16/2022    Lab Results  Component Value Date   CREATININE 0.90 02/01/2022   BUN 14 02/01/2022   NA 142 02/01/2022   K 4.5 02/01/2022   CL 104 02/01/2022   CO2 30  02/01/2022    Lab Results  Component Value Date   CHOL 116 02/01/2022   HDL 34.20 (L) 02/01/2022   LDLCALC 60 02/01/2022   LDLDIRECT 84.3 03/06/2013   TRIG 110.0 02/01/2022   CHOLHDL 3 02/01/2022    Medications Reviewed Today     Reviewed by Alden Hipp, RPH-CPP (Pharmacist) on 09/27/22 at 1451  Med List Status: <None>   Medication Order Taking? Sig Documenting Provider Last Dose Status Informant  Accu-Chek Softclix Lancets lancets [id number]  USE TO CHECK BLOOD SUGAR DAILY Dx E11.59 Karie Schwalbe, MD  Active   cetirizine (ZYRTEC) 10 MG tablet [id number]  Take 1 tablet (10 mg total) by mouth daily as needed for allergies. Karie Schwalbe, MD  Active Self  FARXIGA 10 MG TABS tablet [id number] Yes TAKE ONE TABLET BY MOUTH ONCE DAILY Karie Schwalbe, MD Taking Active   glucose blood (ACCU-CHEK GUIDE) test strip [id number]  USE TO CHECK BLOOD SUGAR ONCE DAILY Dx E11.59 Karie Schwalbe, MD  Active   Insulin Pen Needle (PEN NEEDLES) 32G X 6 MM MISC [id number]  1 each by Does not apply route daily. Karie Schwalbe, MD  Active Self  lisinopril (ZESTRIL) 10 MG tablet [id number] Yes TAKE 1 TABLET BY MOUTH ONCE A DAY Tillman Abide I, MD Taking Active   metFORMIN (GLUCOPHAGE) 1000 MG tablet [id number] Yes TAKE 1 TABLET BY MOUTH TWICE A DAY WITH MEALS Karie Schwalbe, MD Taking Active   Multiple Vitamin (MULTIVITAMIN WITH MINERALS) TABS tablet [id number]  Take 1 tablet by mouth daily. [provider]  Active Self  omeprazole (PRILOSEC)  20 MG capsule [id number] Yes Take 1 capsule (20 mg total) by mouth daily. Karie Schwalbe, MD Taking Active   silodosin (RAPAFLO) 8 MG CAPS capsule [id number] Yes Take 1 capsule (8 mg total) by mouth daily with supper. Bruning, Ashlyn, PA-C Taking Active   simvastatin (ZOCOR) 20 MG tablet [id number] Yes TAKE ONE TABLET BY MOUTH EVERY NIGHT AT BEDTIME Karie Schwalbe, MD Taking Active   TRESIBA FLEXTOUCH 100 UNIT/ML FlexTouch Pen [id number]  Yes INJECT 50 UNITS INTO THE SKIN DAILY Karie Schwalbe, MD Taking Active               Assessment/Plan:   Diabetes: - Currently uncontrolled - Reviewed long term cardiovascular and renal outcomes of uncontrolled blood sugar - Reviewed goal A1c, goal fasting, and goal 2 hour post prandial glucose - Reviewed dietary modifications including: focus on lean proteins, fruits and vegetables, whole grains.  - Discussed GLP1, including mechanism of action, side effects, benefits. Patient is amenable. No history of medullary thyroid cancer, pancreatitis, gallbladder disease. Will pursue patient assistance for Ozempic and [place]. Patient is over the income cut off for [place] assistance.  - Check glucose twice daily, fasting and 2 hour post prandial glucose reaidngs  Hypertension: - Currently controlled - Reviewed long term cardiovascular and renal outcomes of uncontrolled blood pressure - Recommend to continue current regimen at this time  Hyperlipidemia/ASCVD Risk Reduction: - Currently controlled. No reports of myalgias with simvastatin. As LDL is at goal and no anticipated addition of medications with drug drug interactions with simvastatin, will avoid changes at this time - Recommend to continue current regimen.   Follow Up Plan: follow up when Ozempic assistance is received.   Catie Eppie Gibson, PharmD, BCACP, CPP Clinical Pharmacist [facility] [phone]

## 2022-09-30 ENCOUNTER — Telehealth: Payer: Self-pay

## 2022-09-30 NOTE — Telephone Encounter (Signed)
-----   Message from [employer] sent at 09/27/2022  3:22 PM EDT ----- Please attempt online med assistance for Ozempic 0.5 mg weekly and Tresiba U100 50 units daily, pen needles. Thanks!

## 2022-09-30 NOTE — Telephone Encounter (Signed)
Submitted application for OZEMPIC & TRESIBA U100 FLEXTOUCH (+ PEN NEEDLES) to [employer] for patient assistance.   Phone: [phone]

## 2022-10-06 NOTE — Telephone Encounter (Signed)
Received notification from [employer] regarding approval for OZEMPIC & TRESIBA FLEXTOUCH. Patient assistance approved from 10/04/22 to 02/07/23.  Medication will arrive to office in 10-14 business days.  Phone: [phone]

## 2022-10-18 NOTE — Telephone Encounter (Signed)
5 boxes of Ozempic 0.25mg  pens and 4 Boxes of Tresiba received from [employer]. Spoke to pt. Medication in 2nd refrigerator in med area.

## 2022-10-19 ENCOUNTER — Encounter: Payer: Self-pay | Admitting: Pharmacist

## 2022-10-20 ENCOUNTER — Ambulatory Visit: Payer: Medicare PPO

## 2022-10-20 NOTE — Progress Notes (Signed)
Patient in office today for teaching on Ozempic . Patient has brought medication with them to appointment. Printed off Instructions from [employer]. Patient reviewed instruction video on manufactures website Reviewed all instructions with patient and had repeat back to me. Instructed patient on proper cleaning of injection site and any signs of infection. Patient is currently using insulin pen at home. He declined giving injection in office today. He will call office if any questions.

## 2022-10-20 NOTE — Telephone Encounter (Signed)
Pt picked up ozempic today, asked for instructions on how to use, scheduled a walk-in nurse visit for training

## 2022-10-21 ENCOUNTER — Telehealth: Payer: Self-pay | Admitting: Internal Medicine

## 2022-10-21 NOTE — Telephone Encounter (Signed)
I do not think there is a generic. I will forward to Dr Alphonsus Sias to see about changes that can be made.

## 2022-10-21 NOTE — Telephone Encounter (Signed)
Pt asked if there was a possibility of him trying the generic band of FARXIGA 10 MG TABS tablet? Pt states the out of pocket cost is $130 & he cannot afford that, at the moment. Pt states he believes the generic version would be cheaper. Call back # [phone]

## 2022-10-24 NOTE — Telephone Encounter (Signed)
Spoke to pt's wife per DPR. It is a 90 day supply. They will talk to the pharmacy about doing it monthly instead of 90 days. If he wants to look at changing to [facility], they will call us back.

## 2022-11-01 ENCOUNTER — Other Ambulatory Visit: Payer: Self-pay | Admitting: Internal Medicine

## 2022-11-04 ENCOUNTER — Encounter: Payer: Self-pay | Admitting: Pharmacist

## 2022-11-08 ENCOUNTER — Other Ambulatory Visit: Payer: Medicare PPO | Admitting: Pharmacist

## 2022-11-09 ENCOUNTER — Other Ambulatory Visit: Payer: Medicare PPO | Admitting: Pharmacist

## 2022-11-09 NOTE — Progress Notes (Signed)
11/09/2022 Name: horton rhodes MRN: [id number] DOB: October 24, 1950  Chief Complaint  Patient presents with   Diabetes   Hyperlipidemia    tremond prada is a 72 y.o. year old male who presented for a telephone visit.   They were referred to the pharmacist by their PCP for assistance in managing diabetes.    Subjective:  Care Team: Primary Care Provider: Karie Schwalbe, MD   Medication Access/Adherence  Patient reports affordability concerns with their medications: Yes  Farxiga $129 for 3 months. Has not missed any doses due to cost at this time.   Medication Assistance: Yes Novo Nordisk MAP - approved for 2024 Franki Monte, Evaristo Bury, pen needles)   Diabetes: Current medications: metformin 1000 mg twice daily, Tresiba 55 units daily (taking 54), Farxiga 10 mg daily, Ozempic 0.25 mg once weekly  Patient confirms receiving Ozempic (Novo MAP). Has taken 3 doses without any concerns. Denies nausea/GI upset. Notes some decrease in appetite. 4th dose is due this Friday.   SMBG: Current glucose readings: fasting: usually 100-110 mg/dL. Lowest reading: 90 mg/dL once   Hypoglycemia: Patient denies hypoglycemic s/sx including dizziness, shakiness, sweating.  Diet: Reports he has been trying to reduce snacking/eating after 7 pm and has noted great fasting BG.    Hypertension: Current medications: lisinopril 10 mg daily   Is not checking BP at home currently. Denies lightheadedness or dizziness. No chest pain, shortness of breath or headaches.     Hyperlipidemia/ASCVD Risk Reduction Current lipid lowering medications: simvastatin 20 mg    Objective:  Lab Results  Component Value Date   HGBA1C 7.6 (A) 08/16/2022    Lab Results  Component Value Date   CREATININE 0.90 02/01/2022   BUN 14 02/01/2022   NA 142 02/01/2022   K 4.5 02/01/2022   CL 104 02/01/2022   CO2 30 02/01/2022    Lab Results  Component Value Date   CHOL 116 02/01/2022   HDL 34.20 (L)  02/01/2022   LDLCALC 60 02/01/2022   LDLDIRECT 84.3 03/06/2013   TRIG 110.0 02/01/2022   CHOLHDL 3 02/01/2022   Assessment/Plan:   Diabetes: uncontrolled with A1c 7.6% (08/16/22). SMBG indicates good fasting glycemic control with AM readings consistently in the lower 100s mg/dL. Post-prandial control is not as clear, though expect titration of Ozempic to continue to improve post-prandial sugars/A1c. Patient has had no instances of hypoglycemia since last visit. Lowest fasting BG was 90 mg/dL. Tolerating Ozempic well, will take fourth dose this Friday. Notes reduction in appetite. Given no concern for lows, we discussed keeping insulin dose as is. Instructed patient to reduce dose ~10% if regularly seeing fasting sugars nearing 70 mg/dL.  Current regimen: metformin 1000 mg BID, Ozempic 0.25 mg weekly, Tresiba 54 units daily - Increase Ozempic to 0.5 mg weekly after 4th dose of 0.25 mg (due to increase 11/18/22) - Continue all other medications as prescribed  - If any lows, or FBG consistently approaching 70 mg/dL, reduce Tresiba 53% (to 50 units daily) - Reviewed goal A1c, goal fasting, and goal 2 hour post prandial glucose - Reviewed dietary modifications including: focus on lean proteins, fruits and vegetables, whole grains.  - Check glucose twice daily, fasting and 2 hour post prandial glucose reaidngs - Consulted CPhT team for assistance with Farxiga MAP (AZ&Me)  Hypertension: controlled per last clinic BP of 12/60 mmHg (08/16/22). Not checking BP at home. Denies dizziness/lightheadedness. No SOB, CP.  Current regimen: lisinopril 10 mg daily - Continue current regimen without changes  -  Continue current regimen at this time  Hyperlipidemia/ASCVD Risk Reduction: controlled with LDL 60 mg/dL (84/69/62); goal <95 mg/dL. Has been doing well on simvastatin. Given no current drug drug interactions with simvastatin, will avoid changes at this time. Current regimen: simvastatin 20 mg daily  -  Continue current regimen.   Follow Up Plan: PharmD via phone in ~4 weeks  Future Appointments  Date Time Provider Department Center  12/07/2022  2:30 PM [facility] PHARMACIST [facility]  02/16/2023  9:00 AM Karie Schwalbe, MD [facility]    Loree Fee, PharmD Clinical Pharmacist [facility] [phone]

## 2022-11-09 NOTE — Patient Instructions (Signed)
Aaron Zimmerman,   It was a pleasure to see you today! As we discussed:?   Continue metformin 1000 mg twice daily  Continue Farxiga 10 mg daily.  I have reached out to our wonderful pharmacy team to begin the process for applying to the [place] medication assistance program (AZ and [place])  Continue Tresiba 54 units once daily  Continue Ozempic 0.25 mg once weekly injection. After your 4th injection, you may increase the dose to 0.5 mg weekly.  If you experience any low blood sugars, or if your morning (fasting) sugars are nearing 70 mg/dL several days each week: Reduce Tresiba to 50 units daily (this is a ~10% reduction)   Follow up with clinical pharmacist via telephone in about 1 month.    Berenice Primas, PharmD - Clinical Pharmacist

## 2022-11-10 ENCOUNTER — Other Ambulatory Visit: Payer: Medicare PPO

## 2022-11-16 ENCOUNTER — Telehealth: Payer: Self-pay

## 2022-11-16 NOTE — Telephone Encounter (Signed)
-----   Message from Loree Fee sent at 11/09/2022  3:22 PM EDT ----- Hello!  Patient has recently been approved for [employer] MAP. Can we use the same information to help patient with [place] application AZ&Me)? He is taking Farxiga 10 mg daily. Thank ya!

## 2022-11-16 NOTE — Telephone Encounter (Signed)
Mailed az&me application to patients home for [place] assistance.

## 2022-11-28 ENCOUNTER — Encounter: Payer: Self-pay | Admitting: Pharmacist

## 2022-11-29 DIAGNOSIS — H5310 Unspecified subjective visual disturbances: Secondary | ICD-10-CM | POA: Diagnosis not present

## 2022-11-29 LAB — HM DIABETES EYE EXAM

## 2022-12-07 ENCOUNTER — Other Ambulatory Visit: Payer: Medicare PPO | Admitting: Pharmacist

## 2022-12-07 NOTE — Progress Notes (Signed)
Attempted to contact patient for scheduled appointment for medication management. Patient thought that appointment was tomorrow. Patient was already checked in, cannot reschedule this visit.   Opening available, will schedule new follow up visit for 12/08/22 at 2:30 pm.

## 2022-12-08 ENCOUNTER — Other Ambulatory Visit: Payer: Medicare PPO

## 2022-12-08 DIAGNOSIS — E1159 Type 2 diabetes mellitus with other circulatory complications: Secondary | ICD-10-CM

## 2022-12-08 MED ORDER — SEMAGLUTIDE (1 MG/DOSE) 4 MG/3ML ~~LOC~~ SOPN
PEN_INJECTOR | SUBCUTANEOUS | Status: DC
Start: 2022-12-08 — End: 2023-04-11

## 2022-12-08 NOTE — Patient Instructions (Signed)
[name],   It was a pleasure to see you today! As we discussed:?   Continue metformin 1000 mg twice daily  Continue Farxiga 10 mg daily.   Continue Tresiba 52 units once daily until you increase Ozempic  On Monday 12/12/22, increase Ozempic to 1.0 mg once weekly (two 0.5 mg injections on the same day) On Monday 12/12/22, reduce Tresiba to 48 units once daily.   If you experience any low blood sugars, or if your morning (fasting) sugars are nearing 70 mg/dL several days each week: Reduce Tresiba by 2 units  Future Appointments  Date Time Provider Department Center  01/12/2023  3:00 PM [facility] PHARMACIST [facility]  02/16/2023  9:00 AM Karie Schwalbe, MD [facility]   Berenice Primas, PharmD - Clinical Pharmacist

## 2022-12-09 MED ORDER — DAPAGLIFLOZIN PROPANEDIOL 10 MG PO TABS: 10.0000 mg | ORAL_TABLET | Freq: Every day | ORAL | 2 refills | Status: DC

## 2022-12-09 NOTE — Telephone Encounter (Signed)
Rec'd completed patient portion.   Submitted application for FARXIGA to AZ&ME for patient assistance.   Phone: [phone]  Please send a 90 day RX with refills of Farxiga 10MG  to [facility].

## 2022-12-09 NOTE — Telephone Encounter (Signed)
ERX sent.  

## 2022-12-26 DIAGNOSIS — C61 Malignant neoplasm of prostate: Secondary | ICD-10-CM | POA: Diagnosis not present

## 2022-12-26 DIAGNOSIS — R3912 Poor urinary stream: Secondary | ICD-10-CM | POA: Diagnosis not present

## 2022-12-26 DIAGNOSIS — N401 Enlarged prostate with lower urinary tract symptoms: Secondary | ICD-10-CM | POA: Diagnosis not present

## 2022-12-26 DIAGNOSIS — N5201 Erectile dysfunction due to arterial insufficiency: Secondary | ICD-10-CM | POA: Diagnosis not present

## 2023-01-12 ENCOUNTER — Other Ambulatory Visit: Payer: Medicare PPO | Admitting: Pharmacist

## 2023-01-12 ENCOUNTER — Telehealth: Payer: Self-pay

## 2023-01-12 NOTE — Patient Instructions (Signed)
[name],   It was a pleasure to see you today! As we discussed:?   Continue metformin 1000 mg twice daily Continue Ozempic 1.0 mg weekly.  I will plan to call Novo early in the morning to inquire about a December refill.  Continue Farxiga 10 mg daily.  Continue Tresiba 48 units once daily If you experience multiple low blood sugars less than 70 mg/dL, please reduce your Tresiba dose to 44 units daily.   Future Appointments  Date Time Provider Department Center  02/16/2023  9:00 AM Karie Schwalbe, MD [facility]   Berenice Primas, PharmD - Clinical Pharmacist

## 2023-01-12 NOTE — Progress Notes (Signed)
01/12/2023 Name: Aaron Zimmerman MRN: [id number] DOB: 03-27-1950  Chief Complaint  Patient presents with   Diabetes   brockton reo is a 72 y.o. year old male who presented for a telephone visit.   They were referred to the pharmacist by their PCP for assistance in managing diabetes.   Subjective:  Care Team: Primary Care Provider: Karie Schwalbe, MD   Last Diabetes-Related Visit: 11/09/22 with Pharmacist 10/31: Increase Ozempic 1.0 mg weekly. Dose change/refill form signed and faxed to novo 12/08/22.  10/2: Increase Ozempic to 0.5 mg weekly after 4th dose of 0.25 mg (due to increase 11/18/22)  -  If any lows, or FBG consistently approaching 70 mg/dL, reduce Tresiba 21% (to 50 units daily)  Medication Access/Adherence Patient reports affordability concerns with their medications: No. Brand medications through MAP Medication Assistance: Yes Novo Nordisk MAP - approved for 2024 ([place], [place], pen needles) Farxiga through AZ&Me.   Diabetes: Current medications:  metformin 1000 mg twice daily Tresiba 48 units daily Farxiga 10 mg daily Ozempic 1.0 mg once weekly (3 weeks remaining, no new shipment received from Novo)  Has been using Ozempic 1.0 mg weekly dose for about 1 month. Denies nausea/GI upset. Notes decrease in appetite. Reports he has about 3 doses total left. Has not heard from Novo regarding medication refill for December.   SMBG: Current glucose readings: Fasting are usually consistent at 85-89 most days.  Lowest reading: 68 mg/dL once  Highest 308 mg/dL (reports finishing contents of a pen, ~38 units and did not open a new pen to complete his dose)  Hypoglycemia: Patient denies hypoglycemic s/sx including dizziness, shakiness, sweating. Though, did see one reading at 68 mg/dL.   Diet: Reports he has been trying to reduce snacking/eating after 7 pm and has noted great fasting BG.   Hypertension: Current medications: lisinopril 10 mg daily   Is not  checking BP at home currently. Denies lightheadedness or dizziness. No chest pain, shortness of breath or headaches.    Hyperlipidemia/ASCVD Risk Reduction Current lipid lowering medications: simvastatin 20 mg    Objective: Lab Results  Component Value Date   HGBA1C 7.6 (A) 08/16/2022   Lab Results  Component Value Date   CREATININE 0.90 02/01/2022   BUN 14 02/01/2022   NA 142 02/01/2022   K 4.5 02/01/2022   CL 104 02/01/2022   CO2 30 02/01/2022   Lab Results  Component Value Date   CHOL 116 02/01/2022   HDL 34.20 (L) 02/01/2022   LDLCALC 60 02/01/2022   LDLDIRECT 84.3 03/06/2013   TRIG 110.0 02/01/2022   CHOLHDL 3 02/01/2022   Assessment/Plan:   Diabetes: uncontrolled with A1c 7.6% (08/16/22) though suspect likely at goal or close to it per SMBG. FBG remain consistently at goal. Highs are seldom and explained by evening meal changes.  Current regimen: metformin 1000 mg BID, Ozempic 1.0 mg weekly, Tresiba 48 units daily Continue current medications. Continue Tresiba 48 units daily. If starting to see BG <70 mg/dL, reduce dose ? 44 units (~10%) Continue metformin 1000 mg BID Miralax as needed for regularity  Reviewed goal A1c, goal fasting, and goal 2 hour post prandial glucose Reviewed dietary modifications including: focus on lean proteins, fruits and vegetables, whole grains.  Check glucose twice daily, fasting and 2 hour post prandial glucose readings Has been approved for Novo renewal 2025. AZ&Me still pending as patient recently sent pages.    Hypertension: controlled per last clinic BP of 112/60 mmHg (08/16/22). Not  checking BP at home. Denies dizziness/lightheadedness. No SOB, CP.  Current regimen: lisinopril 10 mg daily - Continue current regimen   Hyperlipidemia/ASCVD Risk Reduction: controlled with LDL 60 mg/dL (40/98/11); goal <91 mg/dL. Has been doing well on simvastatin. Given no current drug drug interactions with simvastatin, will avoid changes at this  time. Current regimen: simvastatin 20 mg daily  - Continue current regimen   Follow Up Plan:  PCP scheduled in 1 month Pharmacy follow up ~1 month later or sooner as needed.   Future Appointments  Date Time Provider Department Center  02/16/2023  9:00 AM Karie Schwalbe, MD [facility]  03/16/2023  3:00 PM [facility] [place] [facility] PHARMACIST [facility]    Loree Fee, PharmD Clinical Pharmacist [facility] [phone]

## 2023-01-12 NOTE — Telephone Encounter (Signed)
Left message on VM per DPR that his Ozempic patient Assistance (2 boxes) have arrived.

## 2023-01-16 ENCOUNTER — Encounter: Payer: Self-pay | Admitting: Internal Medicine

## 2023-01-16 NOTE — Telephone Encounter (Signed)
 Care team updated and letter sent for eye exam notes.

## 2023-01-23 ENCOUNTER — Telehealth: Payer: Self-pay | Admitting: Internal Medicine

## 2023-01-23 ENCOUNTER — Telehealth: Payer: Self-pay

## 2023-01-23 NOTE — Telephone Encounter (Signed)
Pt called in and would talk a call back if possible [phone]

## 2023-01-23 NOTE — Telephone Encounter (Signed)
Mailed APP to Patient for Ozempic & Aaron Zimmerman [name] Nordisk need income verified. Faxed Provider portion.

## 2023-01-26 NOTE — Telephone Encounter (Signed)
Patient picked up medication

## 2023-01-31 ENCOUNTER — Telehealth: Payer: Self-pay

## 2023-01-31 NOTE — Telephone Encounter (Signed)
Copied from CRM [id number]. Topic: Clinical - Prescription Issue >> Jan 31, 2023  9:15 AM Theodis Sato wrote: Reason for CRM: PT is out of TRESIBA FLEXTOUCH 100 UNIT/ML FlexTouch Pen- PT states his pharmacy is having a hard time re-filling this medication as he may have started some time of plan with this medication (Nordisk) PT is distressed about not having his medication over the holidays. PT is requesting Dr. Alphonsus Sias or or nurse to call him with a status regarding this issue.

## 2023-01-31 NOTE — Telephone Encounter (Signed)
Spoke to pt. He is going to try and call [employer] to see if they have sent it. If not, hpefully the pharmacy can get 1 rx through until his plan renews in 2025 for the MAP.

## 2023-01-31 NOTE — Telephone Encounter (Signed)
Called and left message for pt because he had called back thinking we got disconnected earlier. I advised I thought we completed the call and wanted to see if he was able to speak to [employer]. I will follow-up with him on Thursday when we return from the holiday.

## 2023-02-02 NOTE — Telephone Encounter (Signed)
Application and physician forms faxed to NovoNordisk. Forms sent up front to be scanned in.

## 2023-02-02 NOTE — Telephone Encounter (Addendum)
NovoNordisk MAP application and physician forms faxed to NovoNordisk. Confirmation received. Sent to the front to be scanned in.

## 2023-02-02 NOTE — Telephone Encounter (Signed)
Spoke to pt. He did receive a 1 month supply of [place].

## 2023-02-16 ENCOUNTER — Ambulatory Visit: Payer: Medicare PPO | Admitting: Internal Medicine

## 2023-02-16 ENCOUNTER — Encounter: Payer: Self-pay | Admitting: Internal Medicine

## 2023-02-16 VITALS — BP 114/64 | HR 68 | Temp 97.7°F | Ht 69.0 in | Wt 210.0 lb

## 2023-02-16 DIAGNOSIS — I700 Atherosclerosis of aorta: Secondary | ICD-10-CM

## 2023-02-16 DIAGNOSIS — Z794 Long term (current) use of insulin: Secondary | ICD-10-CM | POA: Diagnosis not present

## 2023-02-16 DIAGNOSIS — E119 Type 2 diabetes mellitus without complications: Secondary | ICD-10-CM | POA: Insufficient documentation

## 2023-02-16 DIAGNOSIS — Z7985 Long-term (current) use of injectable non-insulin antidiabetic drugs: Secondary | ICD-10-CM

## 2023-02-16 DIAGNOSIS — Z8546 Personal history of malignant neoplasm of prostate: Secondary | ICD-10-CM | POA: Diagnosis not present

## 2023-02-16 DIAGNOSIS — E1159 Type 2 diabetes mellitus with other circulatory complications: Secondary | ICD-10-CM | POA: Diagnosis not present

## 2023-02-16 DIAGNOSIS — E538 Deficiency of other specified B group vitamins: Secondary | ICD-10-CM | POA: Diagnosis not present

## 2023-02-16 DIAGNOSIS — Z0000 Encounter for general adult medical examination without abnormal findings: Secondary | ICD-10-CM

## 2023-02-16 DIAGNOSIS — Z7984 Long term (current) use of oral hypoglycemic drugs: Secondary | ICD-10-CM

## 2023-02-16 DIAGNOSIS — I10 Essential (primary) hypertension: Secondary | ICD-10-CM

## 2023-02-16 LAB — MICROALBUMIN / CREATININE URINE RATIO
Creatinine,U: 118.8 mg/dL
Microalb Creat Ratio: 1 mg/g (ref 0.0–30.0)
Microalb, Ur: 1.2 mg/dL (ref 0.0–1.9)

## 2023-02-16 LAB — VITAMIN B12: Vitamin B-12: 443 pg/mL (ref 211–911)

## 2023-02-16 LAB — HM DIABETES FOOT EXAM

## 2023-02-16 LAB — COMPREHENSIVE METABOLIC PANEL
ALT: 35 U/L (ref 0–53)
AST: 34 U/L (ref 0–37)
Albumin: 4.9 g/dL (ref 3.5–5.2)
Alkaline Phosphatase: 51 U/L (ref 39–117)
BUN: 19 mg/dL (ref 6–23)
CO2: 31 meq/L (ref 19–32)
Calcium: 9.9 mg/dL (ref 8.4–10.5)
Chloride: 103 meq/L (ref 96–112)
Creatinine, Ser: 0.9 mg/dL (ref 0.40–1.50)
GFR: 86.68 mL/min (ref >60.00)
Glucose, Bld: 83 mg/dL (ref 70–99)
Potassium: 4.2 meq/L (ref 3.5–5.1)
Sodium: 142 meq/L (ref 135–145)
Total Bilirubin: 0.7 mg/dL (ref 0.2–1.2)
Total Protein: 7.6 g/dL (ref 6.0–8.3)

## 2023-02-16 LAB — LIPID PANEL
Cholesterol: 117 mg/dL (ref 0–200)
HDL: 34.9 mg/dL — ABNORMAL LOW (ref >39.00)
LDL Cholesterol: 60 mg/dL (ref 0–99)
NonHDL: 81.68
Total CHOL/HDL Ratio: 3
Triglycerides: 106 mg/dL (ref 0.0–149.0)
VLDL: 21.2 mg/dL (ref 0.0–40.0)

## 2023-02-16 LAB — CBC
HCT: 50.9 % (ref 39.0–52.0)
Hemoglobin: 16.5 g/dL (ref 13.0–17.0)
MCHC: 32.5 g/dL (ref 30.0–36.0)
MCV: 87.3 fL (ref 78.0–100.0)
Platelets: 230 10*3/uL (ref 150.0–400.0)
RBC: 5.83 Mil/uL — ABNORMAL HIGH (ref 4.22–5.81)
RDW: 13.6 % (ref 11.5–15.5)
WBC: 5.5 10*3/uL (ref 4.0–10.5)

## 2023-02-16 LAB — HEMOGLOBIN A1C: Hgb A1c MFr Bld: 6.4 % (ref 4.6–6.5)

## 2023-02-16 MED ORDER — TADALAFIL 20 MG PO TABS: 10.0000 mg | ORAL_TABLET | ORAL | 3 refills | Status: DC | PRN

## 2023-02-16 NOTE — Assessment & Plan Note (Signed)
 I have personally reviewed the Medicare Annual Wellness questionnaire and have noted 1. The patient's medical and social history 2. Their use of alcohol, tobacco or illicit drugs 3. Their current medications and supplements 4. The patient's functional ability including ADL's, fall risks, home safety risks and hearing or visual             impairment. 5. Diet and physical activities 6. Evidence for depression or mood disorders  The patients weight, height, BMI and visual acuity have been recorded in the chart I have made referrals, counseling and provided education to the patient based review of the above and I have provided the pt with a written personalized care plan for preventive services.  I have provided you with a copy of your personalized plan for preventive services. Please take the time to review along with your updated medication list.  Discussed exercise Had flu vaccine---recommended COVID update Colon due 2027

## 2023-02-16 NOTE — Assessment & Plan Note (Signed)
 BP Readings from Last 3 Encounters:  02/16/23 114/64  08/16/22 112/60  02/23/22 122/64   Controlled on lisinopril 10

## 2023-02-16 NOTE — Progress Notes (Signed)
 Subjective:    Patient ID: Aaron Zimmerman, male    DOB: November 25, 1950, 73 y.o.   MRN: [id number]  HPI Here for Medicare wellness visit and follow up of chronic health conditions Reviewed advanced directives Reviewed other doctors---Dr Newsome--urology, Dr Truett, Dr Sabra, [facility] derm No hospitalizations or surgery in the past year Walks dog daily---and now working part time at [employer] issues---right eye not right since cataract surgery Hearing is not great--but stable No alcohol or tobacco No falls No depression or anhedonia Independent with instrumental ADLs No sig memory issues  Doing well on ozempic --but wife thinks it makes him moody Is down about 10# Checking sugars daily---fasting 89-98 if no evening eating Has had some sugars at 70---mild symptoms Weaning the tresiba  with Manuelita, pharmacist. Down to 48 No foot numbness or burning  No chest pain or SOB No dizziness or syncope No palpitations No edema Known aortic atherosclerosis  Continues to monitor prostate cancer with urologist Just on rapaflo  every other day Hasn't gotten cialis  yet--needs it sent again  Is taking vitamin B12  Current Outpatient Medications on File Prior to Visit  Medication Sig Dispense Refill   Accu-Chek Softclix Lancets lancets USE TO CHECK BLOOD SUGAR DAILY Dx E11.59 100 each 5   cetirizine  (ZYRTEC ) 10 MG tablet Take 1 tablet (10 mg total) by mouth daily as needed for allergies. 90 tablet 3   dapagliflozin  propanediol (FARXIGA ) 10 MG TABS tablet Take 1 tablet (10 mg total) by mouth daily. 90 tablet 2   glucose blood (ACCU-CHEK GUIDE) test strip USE TO CHECK BLOOD SUGAR ONCE DAILY Dx E11.59 100 strip 5   Insulin  Pen Needle (PEN NEEDLES) 32G X 6 MM MISC 1 each by Does not apply route daily. 100 each 3   lisinopril  (ZESTRIL ) 10 MG tablet TAKE 1 TABLET BY MOUTH ONCE A DAY 90 tablet 3   metFORMIN  (GLUCOPHAGE ) 1000 MG tablet TAKE ONE TABLET BY MOUTH  TWICE A DAY WITH MEALS 180 tablet 3   Multiple Vitamin (MULTIVITAMIN WITH MINERALS) TABS tablet Take 1 tablet by mouth daily.     omeprazole  (PRILOSEC) 20 MG capsule Take 1 capsule (20 mg total) by mouth daily. 90 capsule 3   Semaglutide , 1 MG/DOSE, 4 MG/3ML SOPN Inject 1.0 mg under the skin once weekly     silodosin  (RAPAFLO ) 8 MG CAPS capsule Take 1 capsule (8 mg total) by mouth daily with supper. 30 capsule 2   simvastatin  (ZOCOR ) 20 MG tablet TAKE ONE TABLET BY MOUTH EVERY NIGHT AT BEDTIME 90 tablet 3   TRESIBA  FLEXTOUCH 100 UNIT/ML FlexTouch Pen INJECT 50 UNITS INTO THE SKIN DAILY 45 mL 3   No current facility-administered medications on file prior to visit.    Allergies  Allergen Reactions   Pioglitazone Rash    Actos rash and hot from waist up    Past Medical History:  Diagnosis Date   Cataract right eye    Complication of anesthesia    slow to awaken after 2021 colonscopy   Diabetes mellitus Type 2    Hyperlipidemia    Personal history of COVID-19 07/2020   weakness & fatigue x 1-2 weeks took oral paxlovid all symptoms resolved   Personal history of urinary calculi 2012   Prostate Cancer (HCC)    Seasonal Allergies    Skin abnormality    bottom of left leg scratch healing well pt fell 3 weeks ago per pt on 03-24-2021   Vitamin B12 deficiency    Wears  glasses for reading     Past Surgical History:  Procedure Laterality Date   CATARACT EXTRACTION W/ INTRAOCULAR LENS IMPLANT Right 2023   COLONOSCOPY  2021   CYSTOSCOPY N/A 03/30/2021   Procedure: CYSTOSCOPY FLEXIBLE;  Surgeon: Rosalind Zachary NOVAK, MD;  Location: [facility];  Service: Urology;  Laterality: N/A;  No seeds detected in bladder per Dr. Rosalind   left eye cataract surgery     2-3 yrs ago per pt on 03-24-2021   POLYPECTOMY     RADIOACTIVE SEED IMPLANT N/A 03/30/2021   Procedure: RADIOACTIVE SEED IMPLANT/BRACHYTHERAPY IMPLANT;  Surgeon: Rosalind Zachary NOVAK, MD;  Location: [facility];   Service: Urology;  Laterality: N/A;   SPACE OAR INSTILLATION N/A 03/30/2021   Procedure: SPACE OAR INSTILLATION;  Surgeon: Rosalind Zachary NOVAK, MD;  Location: [facility];  Service: Urology;  Laterality: N/A;   traumatic injury and repair of RT hand  1974    Family History  Problem Relation Age of Onset   Heart disease Mother 51       CAD/PCI with stents; cardiomyopathy with AICD   Cancer - Lung Father    Cancer Other        Colon and Prostate   Colon cancer Neg Hx    Colon polyps Neg Hx    Esophageal cancer Neg Hx    Rectal cancer Neg Hx    Stomach cancer Neg Hx     Social History   Socioeconomic History   Marital status: Married    Spouse name: Not on file   Number of children: 2   Years of education: Not on file   Highest education level: Not on file  Occupational History   Occupation: Maintenance-- temperature controls    Employer: [employer]    Comment: 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   Occupation: Retired-[place] [employer]  Tobacco Use   Smoking status: Never    Passive exposure: Never   Smokeless tobacco: Former    Types: Chew    Quit date: 02/08/1995  Vaping Use   Vaping status: Never Used  Substance and Sexual Activity   Alcohol use: No   Drug use: No   Sexual activity: Yes  Other Topics Concern   Not on file  Social History Narrative   HSG   Married [id number] son 39 1 daughter 83;    4 grandchildren      No living will   Wife would be health care POA--children are alternates   Would accept resuscitation but no prolonged ventilation or feeding tube if cognitively unaware   Social Drivers of Health   Financial Resource Strain: Not on file  Food Insecurity: No Food Insecurity (10/05/2021)   Hunger Vital Sign    Worried About Running Out of Food in the Last Year: Never true    Ran Out of Food in the Last Year: Never true  Transportation Needs: No Transportation Needs (10/05/2021)   PRAPARE - Administrator, Civil Service  (Medical): No    Lack of Transportation (Non-Medical): No  Physical Activity: Not on file  Stress: Not on file  Social Connections: Not on file  Intimate Partner Violence: Not on file   Review of Systems Appetite down--ozempic  Sleeps okay Wears seat belt Needs cavity filled---sees dentist No suspicious skin lesions No heartburn or dysphagia Bowels move fine--no blood Voids okay No sig back or joint pains    Objective:   Physical Exam Constitutional:      Appearance:  Normal appearance.  HENT:     Mouth/Throat:     Pharynx: No oropharyngeal exudate or posterior oropharyngeal erythema.  Eyes:     Conjunctiva/sclera: Conjunctivae normal.     Pupils: Pupils are equal, round, and reactive to light.  Cardiovascular:     Rate and Rhythm: Normal rate and regular rhythm.     Pulses: Normal pulses.     Heart sounds: No murmur heard.    No gallop.  Pulmonary:     Effort: Pulmonary effort is normal.     Breath sounds: Normal breath sounds. No wheezing or rales.  Abdominal:     Palpations: Abdomen is soft.     Tenderness: There is no abdominal tenderness.  Musculoskeletal:     Cervical back: Neck supple.     Right lower leg: No edema.     Left lower leg: No edema.  Lymphadenopathy:     Cervical: No cervical adenopathy.  Skin:    Findings: No lesion or rash.     Comments: No foot lesions  Neurological:     General: No focal deficit present.     Mental Status: He is alert and oriented to person, place, and time.     Comments: Mini--cog normal Normal sensation in feet  Psychiatric:        Mood and Affect: Mood normal.        Behavior: Behavior normal.            Assessment & Plan:

## 2023-02-16 NOTE — Assessment & Plan Note (Signed)
 Better control with the ozempic 1mg  weekly Weaning the tresiba Farxiga 10, metformin 1000 bid also

## 2023-02-16 NOTE — Assessment & Plan Note (Signed)
Will check level ?

## 2023-02-16 NOTE — Assessment & Plan Note (Signed)
 On imaging Simvastatin 20 daily

## 2023-02-16 NOTE — Assessment & Plan Note (Signed)
 No active Rx now

## 2023-02-22 ENCOUNTER — Encounter: Payer: Self-pay | Admitting: Pharmacist

## 2023-02-22 NOTE — Progress Notes (Signed)
 Adult nurse Program (MAP) Application   Manufacturer: [employer] Nordisk  Medication(s): Ozempic, Aaron Zimmerman, Contractor by [employer] CPhT Medication Advocate Team.  Fax received requesting provider page w MDD of Aaron Zimmerman detailed.   APPROVED through 02/07/2024  Aaron Zimmerman, [employer]oFine, Ozempic 1.0 mg   04/10/23: Patient reports delivery of 0.5 mg pens (February) despite application for 1.0 mg pen.  Automated system does report updated Ozempic dose of 1.0 mg is in the process of shipping.  Dose change request form initiated for subsequent refills thereafter ?Ozempic 2.0 mg weekly.  Next Steps: [x]  Dose change form (filled out by PharmD for Ozempipc 2.0 mg pens) - Uploaded to PCP eFax folder [ ]  PCP to review/sign Dose change form [ ]  Once singed, fax to [employer] and scan to chart; NovoNordisk Fax: [phone]  Forwarded to Med Advocate team  Forwarded to Clinical Pool

## 2023-02-23 NOTE — Telephone Encounter (Signed)
PAP: Application for Ozempic and Evaristo Bury has been submitted to [employer], via fax    PLEASE BE ADVISED

## 2023-02-28 DIAGNOSIS — Z961 Presence of intraocular lens: Secondary | ICD-10-CM | POA: Diagnosis not present

## 2023-02-28 DIAGNOSIS — E119 Type 2 diabetes mellitus without complications: Secondary | ICD-10-CM | POA: Diagnosis not present

## 2023-02-28 LAB — HM DIABETES EYE EXAM

## 2023-03-16 ENCOUNTER — Other Ambulatory Visit: Payer: Medicare PPO | Admitting: Pharmacist

## 2023-03-16 ENCOUNTER — Telehealth: Payer: Self-pay

## 2023-03-16 DIAGNOSIS — I10 Essential (primary) hypertension: Secondary | ICD-10-CM

## 2023-03-16 DIAGNOSIS — I700 Atherosclerosis of aorta: Secondary | ICD-10-CM

## 2023-03-16 DIAGNOSIS — E785 Hyperlipidemia, unspecified: Secondary | ICD-10-CM

## 2023-03-16 DIAGNOSIS — E1159 Type 2 diabetes mellitus with other circulatory complications: Secondary | ICD-10-CM

## 2023-03-16 NOTE — Progress Notes (Signed)
 03/16/2023 Name: Aaron Zimmerman MRN: [id number] DOB: 07-28-1950  Chief Complaint  Patient presents with   Diabetes   Hypertension   Hyperlipidemia   Aaron Zimmerman is a 73 y.o. year old male who presented for a telephone visit.   They were referred to the pharmacist by their PCP for assistance in managing diabetes.   Subjective:  Care Team: Primary Care Provider: Jimmy Charlie FERNS, MD   Last Diabetes-Related Visit: 01/12/23 with Pharmacist; 02/16/23 with PCP 12/5: Improved glycemic control per SMBG. No changes.  10/31: Increase Ozempic  1.0 mg weekly. Dose change/refill form signed and faxed to [facility] 12/08/22.  10/2: Increase Ozempic  to 0.5 mg weekly after 4th dose of 0.25 mg (due to increase 11/18/22)  -  If any lows, or FBG consistently approaching 70 mg/dL, reduce Tresiba  10% (to 50 units daily)  Medication Access/Adherence Patient reports affordability concerns with their medications: No. Brand medications through MAP Medication Assistance: Yes Novo [employer] MAP - approved for 2024 (Ozempic , Tresiba , pen needles) Farxiga  through [employer].     Since Last Visit: Has been using Ozempic  1.0 mg without issue for several months now. Denies nausea/GI upset. Doesn't have any other concerns personally with adverse effects.  Diabetes: Current medications:  metformin  1000 mg twice daily Tresiba  48 units daily Farxiga  10 mg daily (>2 months remaining) Ozempic  1.0 mg once weekly (~3 pens remaining. Has not received 2025 shipment yet)  SMBG: Glucometer Current glucose readings: Fasting are consistent at 80-98 mg/dL  Lowest reading: 72, 76 mg/dL once in the past month  Hypoglycemia: Does note minor symptoms when BG is in the mid to lower 70s (slightly shaky/feels a little off)  Diet: Reports he has been trying to reduce snacking/eating after 7 pm. Feels well controlled with his diet currently. Feels he is aware when he is having a meal that will cause sugars to spike. Limits these  types of meals to occasionally.  Hypertension: Current medications: lisinopril  10 mg daily   Is not checking BP at home currently. Denies lightheadedness or dizziness. No chest pain, shortness of breath or headaches.    Hyperlipidemia/ASCVD Risk Reduction Current lipid lowering medications: simvastatin  20 mg    Objective: Lab Results  Component Value Date   HGBA1C 6.4 02/16/2023   Lab Results  Component Value Date   CREATININE 0.90 02/16/2023   BUN 19 02/16/2023   NA 142 02/16/2023   K 4.2 02/16/2023   CL 103 02/16/2023   CO2 31 02/16/2023   Lab Results  Component Value Date   CHOL 117 02/16/2023   HDL 34.90 (L) 02/16/2023   LDLCALC 60 02/16/2023   LDLDIRECT 84.3 03/06/2013   TRIG 106.0 02/16/2023   CHOLHDL 3 02/16/2023   Assessment/Plan:   Diabetes: controlled with A1c 6.4% (02/16/23), improved from 7.6% (08/16/22). FBG remain consistently at goal, rare occurrence of BG near 70 with minor symptoms, no readings <70 in the past month. Plan for small ~5% reduction in basal dose. Highs are seldom and explained by evening meal choices. Patient asks about possibly increasing Ozempic  to 2.0 mg. Will plan on increase with next shipment (though will receive 1.0 mg shipment this month) Current regimen: metformin  1000 mg BID, Ozempic  1.0 mg weekly, Tresiba  48 units daily; Farxiga  10 mg daily  Decrease Tresiba  to 46 units daily (~5%). If multiple BG <70 mg/dL, reduce dose ? 44 units (~10%) Continue all other medications Novo Dose change form started for Ozempic  2.0 mg dose Reviewed goal A1c, goal fasting, and  goal 2 hour post prandial glucose Reviewed dietary modifications including: focus on lean proteins, fruits and vegetables, whole grains.  Check glucose twice daily, fasting and 2 hour post prandial glucose readings Discussed rule of 15 for any BG reading <70 mg/dL Future Consideration: No previous discussion of CGM. Should be covered through Medicare, though could have a copay.  At current time, is very reliable with finger sticks. Sugars remain extremely stable with minimal fluctuations.   Hypertension: controlled per last clinic BP of 114/64 mmHg (02/16/23), consistent from prevous visits. Not checking BP at home. Denies dizziness/lightheadedness. No SOB, CP.  Current regimen: lisinopril  10 mg daily - Continue current regimen   Hyperlipidemia/ASCVD Risk Reduction: controlled with LDL 60 mg/dL (02/16/23) stable from previous 60 mg/dL (87/73/76); goal <29 mg/dL. Has been doing well on simvastatin . Given no current drug drug interactions with simvastatin , will avoid changes at this time. Current regimen: simvastatin  20 mg daily  - Continue current regimen   Follow Up Plan:  PCP 6 month f/u scheduled  Pharmacy follow up ~2 months Patient given direct line for questions/concerns prior to follow up  Future Appointments  Date Time Provider Department Center  05/25/2023  3:00 PM [facility] PHARMACIST [facility]  08/16/2023  9:00 AM Aaron Charlie FERNS, MD [facility]    Aaron Zimmerman, PharmD Clinical Pharmacist [facility] [phone]

## 2023-03-16 NOTE — Patient Instructions (Addendum)
 Mr. andrez lieurance,   It was a pleasure to see you today! As we discussed:?   Continue metformin  1000 mg twice daily Continue Ozempic  1.0 mg weekly.  I will plan to submit a refill request form to [facility] for the 2.0 mg pens for the next shipment  Continue Farxiga  10 mg daily.  We will follow up with [place] [employer] [place] regarding your application status.   Decrease Tresiba  from 48 to 46 units once daily If you experience multiple low blood sugars less than 70 mg/dL, please reduce your Tresiba  dose to 44 units daily.   Future Appointments  Date Time Provider Department Center  05/25/2023  3:00 PM [facility] PHARMACIST [facility]  08/16/2023  9:00 AM Jimmy Charlie FERNS, MD [facility]   Manuelita FABIENE Kobs, PharmD Clinical Pharmacist [facility] [phone]

## 2023-03-16 NOTE — Telephone Encounter (Signed)
 We received 2 boxes of Ozempic  and 2 boxes of Tresiba . Called and spoke to pt. Medication labeled with his name on it and placed in middle fridge in medication area.

## 2023-03-29 ENCOUNTER — Telehealth: Payer: Self-pay

## 2023-03-29 NOTE — Telephone Encounter (Signed)
Copied from CRM [id number]. Topic: General - Other >> Mar 29, 2023  1:57 PM Gurney Maxin H wrote: Reason for CRM: Cala Bradford with [employer] Patient [employer], states order for the Novofine needles for patient was returned and calling to offer the a provider a voucher.  [phone]

## 2023-03-31 ENCOUNTER — Telehealth: Payer: Self-pay

## 2023-03-31 MED ORDER — DAPAGLIFLOZIN PROPANEDIOL 10 MG PO TABS: 10.0000 mg | ORAL_TABLET | Freq: Every day | ORAL | 3 refills | Status: AC

## 2023-03-31 NOTE — Telephone Encounter (Signed)
Received a fax from [facility] [employer] requesting rx for Farxiga. Verified e-scribing pharmacy on [place] [employer] [place] site. Sent rx for 1 year.

## 2023-04-11 ENCOUNTER — Other Ambulatory Visit (INDEPENDENT_AMBULATORY_CARE_PROVIDER_SITE_OTHER): Payer: Self-pay | Admitting: Pharmacist

## 2023-04-11 DIAGNOSIS — E1159 Type 2 diabetes mellitus with other circulatory complications: Secondary | ICD-10-CM

## 2023-04-11 MED ORDER — SEMAGLUTIDE (1 MG/DOSE) 4 MG/3ML ~~LOC~~ SOPN: 1.0000 mg | PEN_INJECTOR | SUBCUTANEOUS | 0 refills | Status: DC

## 2023-04-11 NOTE — Progress Notes (Unsigned)
 Patient Assistance Program (PAP) Application   Manufacturer: [employer]    Shipment Delay 30-day voucher request Medication(s): Ozempic 1.0 mg   BIN: [id number] PCN: [id number] GRP: [id number] Voucher ID: [id number] Medication: Ozempic 1 mg/mL (1 pens x 49mL/pen)  eRx sent to local pharmacy with voucher included on prescription

## 2023-04-12 ENCOUNTER — Other Ambulatory Visit: Payer: Self-pay | Admitting: Internal Medicine

## 2023-04-18 ENCOUNTER — Telehealth: Payer: Self-pay

## 2023-04-18 ENCOUNTER — Encounter: Payer: Self-pay | Admitting: Pharmacist

## 2023-04-18 NOTE — Progress Notes (Signed)
 Patient called to report that he has received his shipment from [employer] Aaron Zimmerman). Also notes he is aware Ozempic has been delivered to clinic.  No further action needed at this time. PAP should continue filling his medications for the remainder of the year.   3/12: Called to ensure Ozempic dose change request reflected in automated system Next refill:  07/07/2023 (confirmed for 1.0 mg pens)

## 2023-04-18 NOTE — Telephone Encounter (Signed)
 Spoke to pt. Advised him I have 4 boxes of Tresiba and 4 boxes of Ozempic  4mg  ready for him to pickup. They are in the middle fridge in the med area.   Also, we discussed that he has an OV with Dr Alphonsus Sias in July. We can repeat the test then.

## 2023-04-18 NOTE — Telephone Encounter (Signed)
 Patient came by and picked up medication.

## 2023-04-27 ENCOUNTER — Other Ambulatory Visit: Payer: Self-pay | Admitting: Internal Medicine

## 2023-05-25 ENCOUNTER — Encounter: Payer: Self-pay | Admitting: Pharmacist

## 2023-05-25 ENCOUNTER — Other Ambulatory Visit: Payer: Medicare PPO | Admitting: Pharmacist

## 2023-05-25 DIAGNOSIS — Z7984 Long term (current) use of oral hypoglycemic drugs: Secondary | ICD-10-CM

## 2023-05-25 DIAGNOSIS — E1159 Type 2 diabetes mellitus with other circulatory complications: Secondary | ICD-10-CM

## 2023-05-25 NOTE — Progress Notes (Signed)
 05/25/2023 Name: Aaron Zimmerman MRN: [id number] DOB: 12-Mar-1950  Chief Complaint  Patient presents with   Diabetes   Aaron Zimmerman is a 73 y.o. year old male who presented for a telephone visit.   They were referred to the pharmacist by their PCP for assistance in managing diabetes.   Subjective:  Care Team: Primary Care Provider: Helaine Llanos, MD   Last Diabetes-Related Visit: 03/16/23 with Pharmacist; 02/16/23 with PCP 2/6: A1c 6.4% w/o hypoglycemia. Starting to see readings mid-upper 70s w symptoms. ??Tresiba  46 u/d (~5%??). Novo dose change form ??Ozempic  2.0 mg/wk 10/31: Nov[facility] dose change/refill form ??Ozempic  1.0 mg/wk  10/2: ??Ozempic  0.5 mg/wk (due on 11/18/22)  Medication Access/Adherence Cost Concerns: No. Brand medications through MAP Medication Assistance: Yes Novo Nordisk MAP - approved for 2025 (Ozempic , Tresiba , pen needles) [place]: Farxiga    Since Last Visit: Has been using Ozempic  1.0 mg without issue for several months now. Denies nausea/GI upset. Doesn't have any other concerns personally with adverse effects. Continues to note SMBG well controlled.   Notes he has 4 boxes Ozempic  1.0 mg, 4 boxes of Tresiba . Just received 90 ds Farxiga  from [place].   Diabetes: Current medications:  metformin  1000 mg twice daily Tresiba  46 units daily Farxiga  10 mg daily (>2 months remaining) Ozempic  1.0 mg once weekly (~3 pens remaining. Has not received 2025 shipment yet)  SMBG: Glucometer Current glucose readings: Fasting are consistent at 85-90s mg/dL  Lowest reading: Only one reading in the mid 70s since insulin  reduction.   Hypoglycemia: Not since last visit.  Generally, does note minor symptoms when BG is in the mid to lower 70s (slightly shaky/feels a little off)  Diet: Reports he has been trying to reduce snacking/eating after 7 pm. Feels well controlled with his diet currently. Feels he is aware when he is having a meal that will cause sugars to spike.  Limits these types of meals to occasionally.  Exercise: More active outside since spring started. Gardening.   Hypertension: Current medications: lisinopril  10 mg daily   Is not checking BP at home currently. Denies lightheadedness or dizziness. No chest pain, shortness of breath or headaches.    Hyperlipidemia/ASCVD Risk Reduction Current lipid lowering medications: simvastatin  20 mg   Objective: Lab Results  Component Value Date   HGBA1C 6.4 02/16/2023   Lab Results  Component Value Date   CREATININE 0.90 02/16/2023   BUN 19 02/16/2023   NA 142 02/16/2023   K 4.2 02/16/2023   CL 103 02/16/2023   CO2 31 02/16/2023   Lab Results  Component Value Date   GFR 86.68 02/16/2023   GFR 87.32 02/01/2022    Lab Results  Component Value Date   CHOL 117 02/16/2023   HDL 34.90 (L) 02/16/2023   LDLCALC 60 02/16/2023   LDLDIRECT 84.3 03/06/2013   TRIG 106.0 02/16/2023   CHOLHDL 3 02/16/2023   Assessment/Plan:   Diabetes: controlled - A1c 6.4% (02/16/23), improved from 7.6% (08/16/22). FBG remain consistently at goal, no longer having readings in the 70s since 5% baal reduction. With this, FBG remain wellw within goal, usually 85-90 mg/dL. Does not seem Novo received dose change quest submitted in Feb. Will plan on increase with next shipment, dose change form re-printed for PCP.  Current regimen: metformin  1000 mg BID, Ozempic  1.0 mg weekly, Tresiba  46 units daily; Farxiga  10 mg daily  Continue all medications Novo Dose change form re-printed for Ozempic  2.0 mg dose N/A patient confirms did not go  without Farxiga  while awaiting PAP. No large gaps in therapy. Renal function has remained stable on routine monitoring.  Reviewed dietary modifications including: focus on lean proteins, fruits and vegetables, whole grains.  Check glucose once-twice daily, fasting and 2 hour post prandial Discussed rule of 15 for any BG reading <70 mg/dL Future Consideration: No previous discussion of  CGM. Should be covered through Medicare, though may have a copay. At current time, is very reliable with finger sticks. Sugars remain extremely stable with minimal fluctuations.  Farxiga  titration to 25 mg daily via PAP (renal function excellent)   Hypertension: controlled per last clinic BP of 114/64 mmHg (02/16/23), consistent from prevous visits. Not checking BP at home. Denies dizziness/lightheadedness. No SOB, CP.  Current regimen: lisinopril  10 mg daily - Continue current regimen   Hyperlipidemia/ASCVD Risk Reduction: controlled with LDL 60 mg/dL (02/16/23) stable from previous 60 mg/dL (04/54/09); goal <81 mg/dL. Has been doing well on simvastatin . Given no current drug drug interactions with simvastatin , will avoid changes at this time. Current regimen: simvastatin  20 mg daily  - Continue current regimen   Follow Up Plan:  PCP 3 month f/u scheduled  Pharmacy follow up ~2 months Patient given direct line for questions/concerns prior to follow up  Future Appointments  Date Time Provider Department Center  08/16/2023  9:00 AM Helaine Llanos, MD [facility]    Daron Ellen, PharmD Clinical Pharmacist [facility] [phone]

## 2023-05-25 NOTE — Progress Notes (Signed)
 Patient Assistance Program (PAP) Application   Manufacturer: Novo Nordisk    (Currently enrolled  -  Dose change Request) Medication(s): ??Ozempic 2.0 mg  Dose Change Form:  05/25/23: Filled out by pharmD, uploaded to PCP eFax for review/signature Once signed, to be faxed to [facility]. Clinical Pool cc'd.   Prescription requested:  Ozempic 2.0 mg weekly (4 boxes)

## 2023-06-06 NOTE — Progress Notes (Signed)
 Form signed and faxed to [facility] and confirmation was received. Sent up front for scanning.

## 2023-06-19 DIAGNOSIS — C61 Malignant neoplasm of prostate: Secondary | ICD-10-CM | POA: Diagnosis not present

## 2023-06-20 ENCOUNTER — Telehealth: Payer: Self-pay

## 2023-06-20 NOTE — Telephone Encounter (Signed)
 Left message on VM per DPR that his Ozempic  Patient Assistance medication is here ready for pickup. There are 4 boxes. They are in the middle fridge in the med area.

## 2023-06-26 DIAGNOSIS — C61 Malignant neoplasm of prostate: Secondary | ICD-10-CM | POA: Diagnosis not present

## 2023-06-26 DIAGNOSIS — N401 Enlarged prostate with lower urinary tract symptoms: Secondary | ICD-10-CM | POA: Diagnosis not present

## 2023-06-26 DIAGNOSIS — R3912 Poor urinary stream: Secondary | ICD-10-CM | POA: Diagnosis not present

## 2023-07-06 ENCOUNTER — Other Ambulatory Visit: Payer: Self-pay | Admitting: Internal Medicine

## 2023-07-24 ENCOUNTER — Other Ambulatory Visit: Payer: Self-pay | Admitting: Internal Medicine

## 2023-08-02 ENCOUNTER — Telehealth: Payer: Self-pay

## 2023-08-02 NOTE — Telephone Encounter (Signed)
 Left message on VM for pt that we have his Tresiba  and Ozempic  here from [facility]. They are in the 3rd fridge in the med area.

## 2023-08-03 NOTE — Telephone Encounter (Signed)
 Patient picked up medication states he had 4 boxes at the house

## 2023-08-16 ENCOUNTER — Encounter: Payer: Self-pay | Admitting: Internal Medicine

## 2023-08-16 ENCOUNTER — Encounter: Payer: Self-pay | Admitting: Pharmacist

## 2023-08-16 ENCOUNTER — Ambulatory Visit: Payer: Medicare PPO | Admitting: Internal Medicine

## 2023-08-16 ENCOUNTER — Ambulatory Visit: Payer: Self-pay | Admitting: Internal Medicine

## 2023-08-16 VITALS — BP 104/60 | HR 72 | Ht 69.0 in | Wt 207.0 lb

## 2023-08-16 DIAGNOSIS — I10 Essential (primary) hypertension: Secondary | ICD-10-CM

## 2023-08-16 DIAGNOSIS — Z7984 Long term (current) use of oral hypoglycemic drugs: Secondary | ICD-10-CM

## 2023-08-16 DIAGNOSIS — R499 Unspecified voice and resonance disorder: Secondary | ICD-10-CM | POA: Diagnosis not present

## 2023-08-16 DIAGNOSIS — E1159 Type 2 diabetes mellitus with other circulatory complications: Secondary | ICD-10-CM

## 2023-08-16 LAB — POCT GLYCOSYLATED HEMOGLOBIN (HGB A1C): Hemoglobin A1C: 6 % — AB (ref 4.0–5.6)

## 2023-08-16 NOTE — Addendum Note (Signed)
 Addended by: KALLIE CLOTILDA SQUIBB on: 08/16/2023 09:34 AM   Modules accepted: Orders

## 2023-08-16 NOTE — Progress Notes (Signed)
 Subjective:    Patient ID: Aaron Zimmerman., male    DOB: [birth year], 73 y.o.   MRN: [id number]  HPI Here for follow up of diabetes and other chronic medical conditions  Doing fine Weight is down a few more pounds No trouble with the ozempic  Insulin  daily--but down to 46 units now---with sugars still fairly low Even has skipped the insulin  at times  Notes a hard spot at bottom of breast bone Not painful (?xiphoid)  Wife notes that his voice weakens at times No heartburn problems---and hasn't needed omeprazole  Rare trouble swallowing--mostly liquids Not hoarse--voice just weakens over time  No chest pain No SOB No dizziness or syncope  Current Outpatient Medications on File Prior to Visit  Medication Sig Dispense Refill   Accu-Chek Softclix Lancets lancets USE TO CHECK BLOOD SUGAR ONCE DAILY 100 each 5   cetirizine  (ZYRTEC ) 10 MG tablet Take 1 tablet (10 mg total) by mouth daily as needed for allergies. 90 tablet 3   dapagliflozin  propanediol (FARXIGA ) 10 MG TABS tablet Take 1 tablet (10 mg total) by mouth daily. 90 tablet 3   glucose blood (ACCU-CHEK GUIDE TEST) test strip USE TO CHECK BLOOD SUGAR ONCE DAILY 100 strip 11   Insulin  Pen Needle (PEN NEEDLES) 32G X 6 MM MISC 1 each by Does not apply route daily. 100 each 3   lisinopril  (ZESTRIL ) 10 MG tablet TAKE ONE TABLET BY MOUTH ONCE A DAY 90 tablet 3   metFORMIN  (GLUCOPHAGE ) 1000 MG tablet TAKE ONE TABLET BY MOUTH TWICE A DAY WITH MEALS 180 tablet 3   Multiple Vitamin (MULTIVITAMIN WITH MINERALS) TABS tablet Take 1 tablet by mouth daily.     omeprazole  (PRILOSEC) 20 MG capsule Take 1 capsule (20 mg total) by mouth daily. 90 capsule 3   Semaglutide , 1 MG/DOSE, 4 MG/3ML SOPN Inject 1 mg as directed once a week. Please bill as primary: BIN: [id number],   PCN: [id number],   GRP: [id number],   ID: [id number] 3 mL 0   simvastatin  (ZOCOR ) 20 MG tablet TAKE ONE TABLET BY MOUTH EVERY NIGHT AT BEDTIME 90 tablet 3   tadalafil  (CIALIS ) 20 MG  tablet Take 0.5-1 tablets (10-20 mg total) by mouth every other day as needed for erectile dysfunction. 30 tablet 3   TRESIBA  FLEXTOUCH 100 UNIT/ML FlexTouch Pen INJECT 50 UNITS INTO THE SKIN DAILY 45 mL 3   No current facility-administered medications on file prior to visit.    Allergies  Allergen Reactions   Pioglitazone Rash    Actos rash and hot from waist up    Past Medical History:  Diagnosis Date   Cataract right eye    Complication of anesthesia    slow to awaken after 2021 colonscopy   Diabetes mellitus Type 2    Hyperlipidemia    Personal history of COVID-19 07/2020   weakness & fatigue x 1-2 weeks took oral paxlovid all symptoms resolved   Personal history of urinary calculi 2012   Prostate Cancer (HCC)    Seasonal Allergies    Skin abnormality    bottom of left leg scratch healing well pt fell 3 weeks ago per pt on 03-24-2021   Vitamin B12 deficiency    Wears glasses for reading     Past Surgical History:  Procedure Laterality Date   CATARACT EXTRACTION W/ INTRAOCULAR LENS IMPLANT Right 2023   COLONOSCOPY  2021   CYSTOSCOPY N/A 03/30/2021   Procedure: CYSTOSCOPY FLEXIBLE;  Surgeon: Rosalind Zachary NOVAK, MD;  Location: [facility]  [place];  Service: Urology;  Laterality: N/A;  No seeds detected in bladder per Dr. Rosalind   left eye cataract surgery     2-3 yrs ago per pt on 03-24-2021   POLYPECTOMY     RADIOACTIVE SEED IMPLANT N/A 03/30/2021   Procedure: RADIOACTIVE SEED IMPLANT/BRACHYTHERAPY IMPLANT;  Surgeon: Rosalind Zachary NOVAK, MD;  Location: [facility];  Service: Urology;  Laterality: N/A;   SPACE OAR INSTILLATION N/A 03/30/2021   Procedure: SPACE OAR INSTILLATION;  Surgeon: Rosalind Zachary NOVAK, MD;  Location: [facility];  Service: Urology;  Laterality: N/A;   traumatic injury and repair of RT hand  1974    Family History  Problem Relation Age of Onset   Heart disease Mother 51       CAD/PCI with stents; cardiomyopathy  with AICD   Cancer - Lung Father    Cancer Other        Colon and Prostate   Colon cancer Neg Hx    Colon polyps Neg Hx    Esophageal cancer Neg Hx    Rectal cancer Neg Hx    Stomach cancer Neg Hx     Social History   Socioeconomic History   Marital status: Married    Spouse name: Not on file   Number of children: 2   Years of education: Not on file   Highest education level: Not on file  Occupational History   Occupation: Maintenance-- temperature controls    Employer: [employer]    Comment: 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   Occupation: Retired-[place] [employer]  Tobacco Use   Smoking status: Never    Passive exposure: Never   Smokeless tobacco: Former    Types: Chew    Quit date: 02/08/1995  Vaping Use   Vaping status: Never Used  Substance and Sexual Activity   Alcohol use: No   Drug use: No   Sexual activity: Yes  Other Topics Concern   Not on file  Social History Narrative   HSG   Married [id number] son 1 1 daughter 71;    4 grandchildren      No living will   Wife would be health care POA--children are alternates   Would accept resuscitation but no prolonged ventilation or feeding tube if cognitively unaware   Social Drivers of Health   Financial Resource Strain: Not on file  Food Insecurity: No Food Insecurity (10/05/2021)   Hunger Vital Sign    Worried About Running Out of Food in the Last Year: Never true    Ran Out of Food in the Last Year: Never true  Transportation Needs: No Transportation Needs (10/05/2021)   PRAPARE - Administrator, Civil Service (Medical): No    Lack of Transportation (Non-Medical): No  Physical Activity: Not on file  Stress: Not on file  Social Connections: Not on file  Intimate Partner Violence: Not on file   Review of Systems Sleeps okay Bowels are slow with ozempic ---still every other day though     Objective:   Physical Exam Constitutional:      Appearance: Normal appearance.  Cardiovascular:      Rate and Rhythm: Normal rate and regular rhythm.     Pulses: Normal pulses.     Heart sounds: No murmur heard.    No gallop.  Pulmonary:     Effort: Pulmonary effort is normal.     Breath sounds: Normal breath sounds. No wheezing or rales.     Comments: Hard  area is xiphoid--reassured Musculoskeletal:     Cervical back: Neck supple.     Right lower leg: No edema.     Left lower leg: No edema.  Lymphadenopathy:     Cervical: No cervical adenopathy.  Neurological:     Mental Status: He is alert.            Assessment & Plan:

## 2023-08-16 NOTE — Assessment & Plan Note (Signed)
 A1c down some to 6.0% Has weaned the insulin  some---will increase ozempic  to 2mg  and then hopefully can even wean off the insulin  completely Also farxiga  10, metformin  1000 bid

## 2023-08-16 NOTE — Assessment & Plan Note (Signed)
 Not really hoarse--but fatigues Did chew tobacco Worth having direct laryngoscopy just in case--- will refer to ENT

## 2023-08-16 NOTE — Assessment & Plan Note (Signed)
 BP Readings from Last 3 Encounters:  08/16/23 104/60  02/16/23 114/64  08/16/22 112/60   Good control on lisinopril  10

## 2023-08-23 ENCOUNTER — Other Ambulatory Visit (HOSPITAL_COMMUNITY): Payer: Self-pay

## 2023-08-23 ENCOUNTER — Other Ambulatory Visit (INDEPENDENT_AMBULATORY_CARE_PROVIDER_SITE_OTHER): Admitting: Pharmacist

## 2023-08-23 DIAGNOSIS — E1159 Type 2 diabetes mellitus with other circulatory complications: Secondary | ICD-10-CM

## 2023-08-23 NOTE — Progress Notes (Signed)
 08/23/2023 Name: Aaron Zimmerman. MRN: [id number] DOB: [birth year]  Chief Complaint  Patient presents with   Diabetes   Aaron Zimmerman. is a 73 y.o. year old male who presented for a follow up telephone visit. They were referred to the pharmacist by their PCP for assistance in managing diabetes.   Subjective:  Care Team: Primary Care Provider: Jimmy Charlie FERNS, MD   Last Diabetes-Related Visit: 08/16/23 with PCP, 05/25/23 with PharmD  Medication Access/Adherence Medication Assistance: Yes Novo Nordisk MAP - approved for 2025 (Ozempic , Tresiba , pen needles) [place]: Farxiga    Since Last Visit: Has been using Ozempic  1.0 mg without issue for several months now. Increased dose to 2.0 mg this past Friday. Doubled up on the 1.0 mg pen last week. Denies nausea/GI upset. No further c/f readings close to 70 mg/dL.  Reports his last Novo shipment received at clinic consisted of only 1.0 mg boxes despite dose change request form approved back in April. Has 4-5 1.0 mg boxes at home currently.    Diabetes: Current medications:  metformin  1000 mg twice daily Tresiba  U100, 45 units daily Farxiga  10 mg daily Ozempic  2.0 mg weekly (increased 7/11)  SMBG: Glucometer Current glucose readings: Fasting are consistent at 87, 90, 78. None 70 or below  Hypoglycemia: Not since last visit.  Generally, does note minor symptoms when BG is in the mid to lower 70s (slightly shaky/feels a little off)  Diet: Reports he has been trying to reduce snacking/eating after 7 pm. Feels well controlled with his diet currently. Feels he is aware when he is having a meal that will cause sugars to spike. Limits these types of meals to occasionally. Only 1 dose of Ozempic  2.0 mg currently though notes  appetite this past weekend felt lower than usual.   Exercise: More active outside since spring started. Gardening.   Weight: Patient-reported goal to reach weight of 199 lb. Reports continued gradual weight loss  currently in the setting of Ozempic  and hard work w dietary modification/healthier choices.   Hypertension: Current medications: lisinopril  10 mg daily   Is not checking BP at home currently. Denies lightheadedness or dizziness. No chest pain, shortness of breath or headaches.    Hyperlipidemia/ASCVD Risk Reduction Current lipid lowering medications: simvastatin  20 mg   Objective: Lab Results  Component Value Date   HGBA1C 6.0 (A) 08/16/2023   Lab Results  Component Value Date   CREATININE 0.90 02/16/2023   BUN 19 02/16/2023   NA 142 02/16/2023   K 4.2 02/16/2023   CL 103 02/16/2023   CO2 31 02/16/2023   Lab Results  Component Value Date   GFR 86.68 02/16/2023   GFR 87.32 02/01/2022    Lab Results  Component Value Date   CHOL 117 02/16/2023   HDL 34.90 (L) 02/16/2023   LDLCALC 60 02/16/2023   LDLDIRECT 84.3 03/06/2013   TRIG 106.0 02/16/2023   CHOLHDL 3 02/16/2023   Assessment/Plan:   Diabetes: controlled - A1c 6.0% decreased form 6.4%. FBG remain consistently at goal without low BG alarms or any readings under 70 in the past couple of weeks. With A1c decreasing, suspect possibly having lows that are unnoticed, or overnight. Reduce basal insulin  today to target A1c closer to 6.5 or 7% at least until glucose patterns are more clear. Patient is open to CGM if covered. Regardless of coverage, will plan for Dexcom sample x1 in clinic as to gain better understanding of BG and hypo risk.  Current regimen: metformin  1000  mg BID, Ozempic  2.0 mg weekly (increased 7/11), Tresiba  45 units daily; Farxiga  10 mg daily  Reduce Tresiba  to 42 units/d Continue all other medications Called Novo: Ozempic  2.0 mg shipment due to process 09/08/23. Advised patient okay to continue doubling up on the 1.0 mg.  Discussed rule of 15 for any BG reading <70 mg/dL  CGM Should be covered through Medicare, though may have a copay. At current time, is very reliable with finger sticks. Sugars remain  extremely stable with minimal fluctuations though A1c continues to fall. Some c/f possible hypo unawareness/overnight lows.  Test claim request sent to Rx team (Dexcom G7 vs Libre 3+) Dexcom PA: [facility] [id number] Will start patient on Sample of Dexcom G7 x1 sensor if costly at pharmacy  OV for CGM sample/setup scheduled 1 week Future Consideration: Farxiga  titration to 25 mg daily via PAP (renal function excellent). May allow for further reduction of insulin . Next visit, consider Dose-change request   Hyperlipidemia/ASCVD Risk Reduction: controlled with LDL 60 mg/dL (02/16/23) stable from previous 60 mg/dL (87/73/76); goal <29 mg/dL. Has been doing well on simvastatin . Given no current drug drug interactions with simvastatin , will avoid changes at this time. Current regimen: simvastatin  20 mg daily  - Continue current regimen   Follow Up Plan:  Pharmacy follow up 1 week OV for CGM training Patient given direct line for questions/concerns prior to follow up  Future Appointments  Date Time Provider Department Center  08/29/2023 10:30 AM [facility] PHARMACIST [facility]    Aaron Zimmerman, PharmD Clinical Pharmacist [facility] [phone]

## 2023-08-24 ENCOUNTER — Other Ambulatory Visit: Payer: Self-pay | Admitting: Pharmacist

## 2023-08-24 ENCOUNTER — Ambulatory Visit: Payer: Self-pay

## 2023-08-24 ENCOUNTER — Telehealth

## 2023-08-24 ENCOUNTER — Telehealth: Payer: Self-pay

## 2023-08-24 ENCOUNTER — Other Ambulatory Visit (HOSPITAL_COMMUNITY): Payer: Self-pay

## 2023-08-24 DIAGNOSIS — E119 Type 2 diabetes mellitus without complications: Secondary | ICD-10-CM

## 2023-08-24 NOTE — Progress Notes (Signed)
 Prior Authorization:   Medication: Dexcom G7 Sensor Prior Authorization Submitted: 08/23/23 CMM Code: [id number]  Approved  PA Case: [id number] Status: Approved, Coverage Starts on: 02/08/2023 12:00:00 AM, Coverage Ends on: 02/07/2024 12:00:00 AM.   Rx pended to PCP:  - Dexcom G7 sensor #3 each, 11 RF

## 2023-08-24 NOTE — Telephone Encounter (Signed)
 Spoke to pt. He has an appt with Dr Avelina tomorrow.

## 2023-08-24 NOTE — Telephone Encounter (Signed)
 Pharmacy Patient Advocate Encounter  Insurance verification completed.   The patient is insured through [facility]   Ran test claim for Dexcom 7. Currently a quantity of 9 is a 90 day supply and the co-pay is $0 .   This test claim was processed through [facility]- copay amounts may vary at other pharmacies due to pharmacy/plan contracts, or as the patient moves through the different stages of their insurance plan.

## 2023-08-24 NOTE — Telephone Encounter (Signed)
 FYI Only or Action Required?: FYI only for provider.  Patient was last seen in primary care on 08/16/2023 by Jimmy Charlie FERNS, MD.  Called Nurse Triage reporting Cough.  Symptoms began several days ago.  Interventions attempted: Nothing.  Symptoms are: unchanged.  Triage Disposition: See Physician Within 24 Hours  Patient/caregiver understands and will follow disposition?: Yes, will follow disposition  Copied from CRM [id number]. Topic: Clinical - Red Word Triage >> Aug 24, 2023 10:07 AM Jayma L wrote: Red Word that prompted transfer to Nurse Triage: patients wife is on the line, linda. She said patient is having a bad cough with a possible sinus infection. Started yesterday and getting worse, he is also extremely tired or fatigue. Reason for Disposition  SEVERE coughing spells (e.g., whooping sound after coughing, vomiting after coughing)  Answer Assessment - Initial Assessment Questions 1. ONSET: When did the cough begin?      4 days ago 2. SEVERITY: How bad is the cough today?  mild 3. SPUTUM: Describe the color of your sputum (e.g., none, dry cough; clear, white, yellow, green)     Green and brown 4. HEMOPTYSIS: Are you coughing up any blood? If Yes, ask: How much? (e.g., flecks, streaks, tablespoons, etc.)     denies 5. DIFFICULTY BREATHING: Are you having difficulty breathing? If Yes, ask: How bad is it? (e.g., mild, moderate, severe)      denies 6. FEVER: Do you have a fever? If Yes, ask: What is your temperature, how was it measured, and when did it start?     100 10. OTHER SYMPTOMS: Do you have any other symptoms? (e.g., runny nose, wheezing, chest pain)       Face hurts-hx of sinus infections  Protocols used: Cough - Acute Productive-A-AH

## 2023-08-25 ENCOUNTER — Encounter: Payer: Self-pay | Admitting: Family Medicine

## 2023-08-25 ENCOUNTER — Ambulatory Visit: Admitting: Family Medicine

## 2023-08-25 ENCOUNTER — Ambulatory Visit: Payer: Self-pay | Admitting: *Deleted

## 2023-08-25 VITALS — BP 102/66 | HR 98 | Temp 99.3°F | Ht 69.0 in | Wt 203.2 lb

## 2023-08-25 DIAGNOSIS — R509 Fever, unspecified: Secondary | ICD-10-CM

## 2023-08-25 DIAGNOSIS — R051 Acute cough: Secondary | ICD-10-CM

## 2023-08-25 LAB — POC COVID19 BINAXNOW: SARS Coronavirus 2 Ag: NEGATIVE

## 2023-08-25 LAB — POC INFLUENZA A&B (BINAX/QUICKVUE)
Influenza A, POC: NEGATIVE
Influenza B, POC: NEGATIVE

## 2023-08-25 MED ORDER — GUAIFENESIN-CODEINE 100-10 MG/5ML PO SOLN: 5.0000 mL | Freq: Every day | ORAL | 0 refills | Status: AC

## 2023-08-25 NOTE — Telephone Encounter (Signed)
 Patient was also triaged in this note below.

## 2023-08-25 NOTE — Telephone Encounter (Signed)
          Reason for Disposition  Health information question, no triage required and triager able to answer question  Answer Assessment - Initial Assessment Questions 1. REASON FOR CALL: What is the main reason for your call? or How can I best help you?   Wife is calling in.   Pt was seen in the office today and diagnosed with a upper respiratory infection.   His oxygen level in the office was on the lower end of normal in low 90s.   I checked his oxygen level here at home and one time it was 88%.  Within a few minutes it came back up to 93%.   When should I be concerned about his oxygen level and how low can it get before I should be concerned? 2. SYMPTOMS : Do you have any symptoms?      He was diagnosed with a upper respiratory infection that is producing a lot of mucus.    I let her know if his oxygen level got into the 80s and he was short of breath, wheezing, c/o not feeling like he was getting enough oxygen that he should go to the ED.   I confirmed that he was not on home oxygen, had underlying lung issues like COPD, asthma or heart problems.  She said he did not have any of these issues.   3. OTHER QUESTIONS: Do you have any other questions?     No  Protocols used: Information Only Call - No Triage-A-AH FYI Only or Action Required?: FYI only for provider.  Patient was last seen in primary care on 08/25/2023 by Avelina Greig BRAVO, MD.  Called Nurse Triage reporting Advice Only.  Symptoms began today.  Interventions attempted: Prescription medications: medications prescribed by Dr. Bedsole during OV today.  Symptoms are: unchanged  Had question about when to be concerned about oxygen level getting into the 80s on pulse oximeter. .  Triage Disposition: Information or Advice Only Call  Patient/caregiver understands and will follow disposition?: Yes

## 2023-08-25 NOTE — Patient Instructions (Signed)
 Push fluids, rest.  Use mucinex  during the day and can use prescription at night.  Call if not improving as expected over the next 5-7 days, go to ER if severe shortness of breath.

## 2023-08-25 NOTE — Telephone Encounter (Unsigned)
 Copied from CRM [id number]. Topic: Clinical - Red Word Triage >> Aug 25, 2023  3:22 PM Jasmin G wrote: Red Word that prompted transfer to Nurse Triage: Pt. Was seen this morning by Dr. Avelina due to his oxygen levels being on the lowers side, pt. Wife's is on the line and states that his levels are now at 88 and does not know what that means. I tried to reach one of PCP's nurses at clinic but they were busy, so I was told that the best route would be to contact NT. >> Aug 25, 2023  3:52 PM [place] J wrote: Patient's wife is calling back and is wondering if she should be concerned if his oxygen levels reach 88. She does not want to speak with triage due to a bad experience this morning. She will like to receive a call back from the nurse in the clinic. She is also stating concern on not knowing what to do when his oxygen drops and if it does over the weekend what is the best course of action.

## 2023-08-25 NOTE — Progress Notes (Signed)
 Patient ID: Aaron Zimmerman., male    DOB: [birth year], 73 y.o.   MRN: [id number]  This visit was conducted in person.  BP 102/66   Pulse 98   Temp 99.3 F (37.4 C) (Temporal)   Ht 5' 9 (1.753 m)   Wt 203 lb 4 oz (92.2 kg)   SpO2 92%   BMI 30.01 kg/m    CC:  Chief Complaint  Patient presents with   Cough    Started on Tuesday   Generalized Body Aches        Chills        Fever    Subjective:   HPI: Aaron Zimmerman. is a 73 y.o. male presenting on 08/25/2023 for Cough (Started on Tuesday), Generalized Body Aches (/), Chills (/), and Fever   Date of onset:  3 days Initial symptoms included  headache Chills/fever (100.1 F), body aches Symptoms progressed to cough, productive dark sputum  No ear pain, no ST.   Pressure in bilateral face.  No SOB, no wheeze.  No N/V/D. No dysuria.  Sick contacts:  work contact COVID testing:   none     He has tried to treat with  Mucinex ,  tylenol.    Drinking liquids.  No history of chronic lung disease such as asthma or COPD. Former  remote cigar smoker.  History of DM  Lab Results  Component Value Date   HGBA1C 6.0 (A) 08/16/2023          Relevant past medical, surgical, family and social history reviewed and updated as indicated. Interim medical history since our last visit reviewed. Allergies and medications reviewed and updated. Outpatient Medications Prior to Visit  Medication Sig Dispense Refill   Accu-Chek Softclix Lancets lancets USE TO CHECK BLOOD SUGAR ONCE DAILY 100 each 5   cetirizine  (ZYRTEC ) 10 MG tablet Take 1 tablet (10 mg total) by mouth daily as needed for allergies. 90 tablet 3   dapagliflozin  propanediol (FARXIGA ) 10 MG TABS tablet Take 1 tablet (10 mg total) by mouth daily. 90 tablet 3   glucose blood (ACCU-CHEK GUIDE TEST) test strip USE TO CHECK BLOOD SUGAR ONCE DAILY 100 strip 11   Insulin  Pen Needle (PEN NEEDLES) 32G X 6 MM MISC 1 each by Does not apply route daily. 100 each 3    lisinopril  (ZESTRIL ) 10 MG tablet TAKE ONE TABLET BY MOUTH ONCE A DAY 90 tablet 3   metFORMIN  (GLUCOPHAGE ) 1000 MG tablet TAKE ONE TABLET BY MOUTH TWICE A DAY WITH MEALS 180 tablet 3   Multiple Vitamin (MULTIVITAMIN WITH MINERALS) TABS tablet Take 1 tablet by mouth daily.     omeprazole  (PRILOSEC) 20 MG capsule Take 1 capsule (20 mg total) by mouth daily. 90 capsule 3   Semaglutide , 1 MG/DOSE, 4 MG/3ML SOPN Inject 1 mg as directed once a week. Please bill as primary: BIN: [id number],   PCN: [id number],   GRP: [id number],   ID: [id number] 3 mL 0   simvastatin  (ZOCOR ) 20 MG tablet TAKE ONE TABLET BY MOUTH EVERY NIGHT AT BEDTIME 90 tablet 3   TRESIBA  FLEXTOUCH 100 UNIT/ML FlexTouch Pen INJECT 50 UNITS INTO THE SKIN DAILY 45 mL 3   tadalafil  (CIALIS ) 20 MG tablet Take 0.5-1 tablets (10-20 mg total) by mouth every other day as needed for erectile dysfunction. 30 tablet 3   No facility-administered medications prior to visit.     Per HPI unless specifically indicated in ROS section below Review of Systems  Constitutional:  Positive for chills and fever. Negative for fatigue.  HENT:  Positive for congestion and sinus pressure. Negative for ear pain, postnasal drip, sinus pain and sore throat.   Eyes:  Negative for pain.  Respiratory:  Positive for cough. Negative for shortness of breath.   Cardiovascular:  Negative for chest pain, palpitations and leg swelling.  Gastrointestinal:  Negative for abdominal pain.  Genitourinary:  Negative for dysuria.  Musculoskeletal:  Negative for arthralgias.  Neurological:  Negative for syncope, light-headedness and headaches.  Psychiatric/Behavioral:  Negative for dysphoric mood.    Objective:  BP 102/66   Pulse 98   Temp 99.3 F (37.4 C) (Temporal)   Ht 5' 9 (1.753 m)   Wt 203 lb 4 oz (92.2 kg)   SpO2 92%   BMI 30.01 kg/m   Wt Readings from Last 3 Encounters:  08/25/23 203 lb 4 oz (92.2 kg)  08/16/23 207 lb (93.9 kg)  02/16/23 210 lb (95.3 kg)       Physical Exam Vitals reviewed.  Constitutional:      Appearance: He is well-developed.  HENT:     Head: Normocephalic.     Right Ear: Hearing normal.     Left Ear: Hearing normal.     Nose: Nose normal.  Neck:     Thyroid: No thyroid mass or thyromegaly.     Vascular: No carotid bruit.     Trachea: Trachea normal.  Cardiovascular:     Rate and Rhythm: Normal rate and regular rhythm.     Pulses: Normal pulses.     Heart sounds: Heart sounds not distant. No murmur heard.    No friction rub. No gallop.     Comments: No peripheral edema Pulmonary:     Effort: Pulmonary effort is normal. No respiratory distress.     Breath sounds: Normal breath sounds.  Skin:    General: Skin is warm and dry.     Findings: No rash.  Psychiatric:        Speech: Speech normal.        Behavior: Behavior normal.        Thought Content: Thought content normal.       Results for orders placed or performed in visit on 08/25/23  POC COVID-19   Collection Time: 08/25/23 12:19 PM  Result Value Ref Range   SARS Coronavirus 2 Ag Negative Negative  POC Influenza A&B (Binax test)   Collection Time: 08/25/23 12:19 PM  Result Value Ref Range   Influenza A, POC Negative Negative   Influenza B, POC Negative Negative    Assessment and Plan  Acute cough Assessment & Plan: Acute, most consistent with viral upper respiratory tract infection.  Negative COVID and flu testing in office today. Lungs clear to auscultation bilaterally: No sign of pneumonia or wheeze. Patient without facial tenderness and normal ear exam.  No clear sign of bacterial infection at this time.  Recommend supportive and symptomatic care.  Prescription for cough suppressant provided.  Encourage patient to keep up with water  and rest.  If not improving as expected over the next 5 to 7 days he will call for further evaluation.  If severe shortness of breath he will go to the emergency room.  Orders: -     POC COVID-19 BinaxNow -      POC Influenza A&B(BINAX/QUICKVUE)  Fever, unspecified -     POC COVID-19 BinaxNow -     POC Influenza A&B(BINAX/QUICKVUE)  Other orders -     guaiFENesin -Codeine ; Take  5-10 mLs by mouth at bedtime.  Dispense: 100 mL; Refill: 0    No follow-ups on file.   Greig Ring, MD

## 2023-08-25 NOTE — Assessment & Plan Note (Signed)
 Acute, most consistent with viral upper respiratory tract infection.  Negative COVID and flu testing in office today. Lungs clear to auscultation bilaterally: No sign of pneumonia or wheeze. Patient without facial tenderness and normal ear exam.  No clear sign of bacterial infection at this time.  Recommend supportive and symptomatic care.  Prescription for cough suppressant provided.  Encourage patient to keep up with water  and rest.  If not improving as expected over the next 5 to 7 days he will call for further evaluation.  If severe shortness of breath he will go to the emergency room.

## 2023-08-25 NOTE — Telephone Encounter (Signed)
 He is lung exam was clear in the office today and his oxygen level was in the normal range at 92%.   He denied shortness of breath at the time of appointment He has no past issue of lung issues so if his oxygen level is dropping down to less than 90% consistently he should be seen at the emergency room over the weekend.SABRA

## 2023-08-25 NOTE — Telephone Encounter (Signed)
 Left message for Mr. Nelwyn with information below from Dr. Avelina.  I ask that he call us  back if he has any questions.

## 2023-08-26 MED ORDER — DEXCOM G7 SENSOR MISC
11 refills | Status: DC
Start: 2023-08-26 — End: 2023-10-02

## 2023-08-28 NOTE — Telephone Encounter (Signed)
 Left message to call office with follow-up on how he is doing.

## 2023-08-29 ENCOUNTER — Ambulatory Visit

## 2023-08-29 ENCOUNTER — Encounter: Payer: Self-pay | Admitting: Pharmacist

## 2023-08-29 DIAGNOSIS — Z7984 Long term (current) use of oral hypoglycemic drugs: Secondary | ICD-10-CM | POA: Diagnosis not present

## 2023-08-29 DIAGNOSIS — E119 Type 2 diabetes mellitus without complications: Secondary | ICD-10-CM

## 2023-08-29 DIAGNOSIS — Z794 Long term (current) use of insulin: Secondary | ICD-10-CM

## 2023-08-29 NOTE — Progress Notes (Signed)
 08/29/2023 Name: Aaron Zimmerman. MRN: [id number] DOB: [birth year]  Subjective  Chief Complaint  Patient presents with   Diabetes    Reason for visit: ?  Aaron Zimmerman. is a 73 y.o. male with a history of diabetes (type 2), who presents today for a follow up visit related to diabetes.?  Care Team: Primary Care Provider: Jimmy Charlie FERNS, MD  Medication Access/Adherence: Prescription drug coverage: Payor: [employer] MEDICARE / Plan: [employer] MEDICARE CHOICE PPO / Product Type: *No Product type* / .  - Reports that all medications are affordable. Dexcom G7 sensors approved. Test claim = $0/month. Has not yet received notice from pharmacy of Rx being ready. Plans to call this afternoon.   Since Last visit / History of Present Illness: ?  Today, patient has brought their smart phone with them to the visit.   Patient does have a compatible smart phone. Android phone.    Hypo/Hyperglycemia: ?  Symptoms of hypoglycemia since last visit:? no  Symptoms of hyperglycemia since last visit:? no      _______________________________________________  Objective    Review of Systems:?  Constitutional:? No fever, chills or unintentional weight loss  GI:? No nausea, vomiting, constipation, diarrhea, abdominal pain, dyspepsia, change in bowel habits  Endocrine:? No polyuria, polyphagia or blurred vision    Physical Examination:  Vitals:  Wt Readings from Last 3 Encounters:  08/25/23 203 lb 4 oz (92.2 kg)  08/16/23 207 lb (93.9 kg)  02/16/23 210 lb (95.3 kg)   BP Readings from Last 3 Encounters:  08/25/23 102/66  08/16/23 104/60  02/16/23 114/64   Pulse Readings from Last 3 Encounters:  08/25/23 98  08/16/23 72  02/16/23 68     Labs:?  Lab Results  Component Value Date   HGBA1C 6.0 (A) 08/16/2023   HGBA1C 6.4 02/16/2023   HGBA1C 7.6 (A) 08/16/2022   GLUCOSE 83 02/16/2023   CREATININE 0.90 02/16/2023   CREATININE 0.90 02/01/2022   CREATININE 0.98 04/20/2021   GFR 86.68  02/16/2023   GFR 87.32 02/01/2022   GFR 79.27 04/20/2021    Lab Results  Component Value Date   CHOL 117 02/16/2023   LDLCALC 60 02/16/2023   LDLCALC 60 02/01/2022   LDLCALC 55 12/15/2020   LDLDIRECT 84.3 03/06/2013   LDLDIRECT 78.7 12/29/2011   HDL 34.90 (L) 02/16/2023   TRIG 106.0 02/16/2023   TRIG 110.0 02/01/2022   TRIG 88.0 12/15/2020   ALT 35 02/16/2023   ALT 25 02/01/2022   AST 34 02/16/2023   AST 22 02/01/2022      Chemistry      Component Value Date/Time   NA 142 02/16/2023 0938   K 4.2 02/16/2023 0938   CL 103 02/16/2023 0938   CO2 31 02/16/2023 0938   BUN 19 02/16/2023 0938   CREATININE 0.90 02/16/2023 0938      Component Value Date/Time   CALCIUM 9.9 02/16/2023 0938   ALKPHOS 51 02/16/2023 0938   AST 34 02/16/2023 0938   ALT 35 02/16/2023 0938   BILITOT 0.7 02/16/2023 0938       The ASCVD Risk score (Arnett DK, et al., 2019) failed to calculate for the following reasons:   The valid total cholesterol range is 130 to 320 mg/dL  Assessment and Plan:   1. Diabetes, type 2: controlled A1c 6.0% decreased form 6.4%. FBG remain consistently at goal without low readings under 70 in the past week. Confirms reduction of Tresiba  to 42 units without significant rise in  FBG.  Continue all medications today without change Follow up ~1-2 weeks to review first CGM data. Suspect ongoing reduction of insulin  given excellent glycemic control. Reviewed low BG (<70 mg/dL). Reviewed Rule of 15.    2. CGM training: System overview of Dexcom G7 given included 10 day sensor wear and appropriate site placement (back of upper arm) Reviewed trend arrows and treament decisions, along with noting a 5-10 minute delay in readings and to always confirm low glucose alarms with glucometer if no symptoms.  Reviewed possibility of false compression lows Reviewed troubleshoot guides and to call [facility] Customer Support for technical issues or difficulties. Dexcom G7App installed on  patient's phone and set up with them in office.  Sample G7 sensor provided to patient and placed in clinic. Connected successfully with phone App.  Patient has been successfully linked to [facility] ([email or web address])    Follow Up Diabetes follow up via phone with PharmD ~1-2 weeks if c/f alarms.  Patient given direct line for questions/concerns regarding Dexcom.   Future Appointments  Date Time Provider Department Center  09/13/2023  8:30 AM [facility] PHARMACIST [facility]    Manuelita FABIENE Kobs, PharmD Clinical Pharmacist [facility] [phone]

## 2023-08-29 NOTE — Patient Instructions (Signed)
 Sensor Application If using the phone App, you can tap Help in the Main Menu to access an in-app tutorial on applying a Sensor. See below for instructions on how to download the app. Apply Sensors only on the back of your upper arm. If placed in other areas, the Sensor may not function properly and could give you inaccurate readings. Avoid areas with scars, moles, stretch marks, or lumps.   Select an area of skin that generally stays flat during your normal daily activities (no bending or folding). Choose a site that is at least 1 inch (2.5 cm) away from any injection sites. To prevent discomfort or skin irritation, you should select a different site other than the one most recently used. Wash application site using a plain soap or clean with an alcohol wipe. This will help remove any oily residue that may prevent the sensor from sticking properly. Allow site to air dry before proceeding. Note: The area MUST be clean and dry, or the Sensor may not stay on for the full wear duration specified by your Sensor insert. 4. Unscrew the cap from the Sensor Applicator and set the cap aside.  5. Place the Sensor Applicator over the prepared site and push down firmly to apply the Sensor to your body. 6. Gently pull the Sensor Applicator away from your body. The Sensor should now be attached to your skin. 7. Make sure the Sensor is secure after application. Put the cap back on the Sensor Applicator. Discard the used Engineer, agricultural according to local regulations.   How to Prevent Your Sensor From Falling Off Early? Make sure you don't have lotion on your arms/abdomen before applying the sensor.  Use alcohol to clean the area of oils. Make sure your skin is clean and dry before applying a new sensor. You can go to your local pharmacy and ask for Tegaderm, or something similar.  It is a clear, waterproof patch that you can lay over the sensor to help it to stay on longer. Or you can order patches from amazon (or  elsewhere online). Try searching Dexcom patch on Amazon. Be sure to call Dexcom and ask for replacements for the sensors that have fallen off.  They will offer to replace at least some of the lost sensors. Dexcom Customer Support: If your sensor falls off before the 10 days are up, or if it stops working, you can request a replacement sensor by calling Dexcom support at [phone] or visit, [email or web address]. When completing the form, state that the sensor stopped early or it fell off.

## 2023-08-30 NOTE — Telephone Encounter (Signed)
 Spoke to pt. He said he thinks he is turning the corner. He did work the last 2 days. Will let us  know if he makes a turn for the worst.

## 2023-09-01 ENCOUNTER — Encounter (INDEPENDENT_AMBULATORY_CARE_PROVIDER_SITE_OTHER): Payer: Self-pay | Admitting: Otolaryngology

## 2023-09-06 ENCOUNTER — Encounter: Payer: Self-pay | Admitting: Internal Medicine

## 2023-09-13 ENCOUNTER — Other Ambulatory Visit (INDEPENDENT_AMBULATORY_CARE_PROVIDER_SITE_OTHER): Admitting: Pharmacist

## 2023-09-13 DIAGNOSIS — E119 Type 2 diabetes mellitus without complications: Secondary | ICD-10-CM

## 2023-09-13 NOTE — Patient Instructions (Signed)
 [name].,   It was a pleasure to speak with you today! As we discussed:?   You may reduce your Tresiba  again to 38 units once daily. Hopefully this will get rid of those low sugar alarms over night.  If you are still noticing overnight low alarms despite this, you may reduce further to 36 units daily   Continue all other medications as you have been taking them   Please reach out prior to your next scheduled appointment should you have any questions or concerns.  You may respond directly to this message, or leave me a voicemail at [phone] and I will get back to you shortly.   Thank you!   Aaron Zimmerman, PharmD Clinical Pharmacist [facility] [phone]

## 2023-09-13 NOTE — Progress Notes (Signed)
 09/13/2023 Name: Aaron Zimmerman. MRN: [id number] DOB: Jun 11, 1950  Subjective  Chief Complaint  Patient presents with   Diabetes   Reason for visit: ?  Aaron Zimmerman. is a 73 y.o. male with a history of diabetes (type 2), who presents today for a follow up visit related to diabetes.?  Care Team: Primary Care Provider: Jimmy Charlie FERNS, MD  Medication Access/Adherence: Prescription drug coverage: Payor: [employer] MEDICARE / Plan: [employer] MEDICARE CHOICE PPO / Product Type: *No Product type* / .  - Reports that all medications are affordable.  Recent Summary of Change: 7/22: Dexcom start ($0/month) 7/16: ?Tresiba  42 units daily   Since Last visit / History of Present Illness: ?  Since last visit, patient reports doing well overall. Denies issues with new Dexcom sensor. Confirms increasing Ozempic  to 2 mg without issues or adverse effects.   Notes low alarms overnight two days since starting Dexcom sensor. Self-reduced his Tresiba  from 42 to 40 units without further lows.   Reported Regimen: Farxiga  10 mg daily  Metformin  1000 mg twice daily  Tresiba  units once daily (self-reduced to 40 units daily ~1 week ago) Ozempic  2.0 mg weekly   SMBG: Dexcom (connected to clarity) CGM Report (Date Range 7/24 - 8/6) ABG 123 mg/dL; GMI 3.6%; Variability 26.3%; TIR 94%, high 5%, very high 0%, low 1%, very low <1%     Hypo/Hyperglycemia: ?  Symptoms of hypoglycemia since last visit:? no - though several instances of BG<70 mg/dL on CGM Symptoms of hyperglycemia since last visit:? no      _______________________________________________  Objective    Review of Systems:?  Constitutional:? No fever, chills or unintentional weight loss  GI:? No nausea, vomiting, constipation, diarrhea, abdominal pain, dyspepsia, change in bowel habits  Endocrine:? No polyuria, polyphagia or blurred vision    Physical Examination:  Vitals:  Wt Readings from Last 3 Encounters:  08/25/23 203 lb 4 oz  (92.2 kg)  08/16/23 207 lb (93.9 kg)  02/16/23 210 lb (95.3 kg)   BP Readings from Last 3 Encounters:  08/25/23 102/66  08/16/23 104/60  02/16/23 114/64   Pulse Readings from Last 3 Encounters:  08/25/23 98  08/16/23 72  02/16/23 68     Labs:?  Lab Results  Component Value Date   HGBA1C 6.0 (A) 08/16/2023   HGBA1C 6.4 02/16/2023   HGBA1C 7.6 (A) 08/16/2022   GLUCOSE 83 02/16/2023   CREATININE 0.90 02/16/2023   CREATININE 0.90 02/01/2022   CREATININE 0.98 04/20/2021   GFR 86.68 02/16/2023   GFR 87.32 02/01/2022   GFR 79.27 04/20/2021    Lab Results  Component Value Date   CHOL 117 02/16/2023   LDLCALC 60 02/16/2023   LDLCALC 60 02/01/2022   LDLCALC 55 12/15/2020   LDLDIRECT 84.3 03/06/2013   LDLDIRECT 78.7 12/29/2011   HDL 34.90 (L) 02/16/2023   TRIG 106.0 02/16/2023   TRIG 110.0 02/01/2022   TRIG 88.0 12/15/2020   ALT 35 02/16/2023   ALT 25 02/01/2022   AST 34 02/16/2023   AST 22 02/01/2022      Chemistry      Component Value Date/Time   NA 142 02/16/2023 0938   K 4.2 02/16/2023 0938   CL 103 02/16/2023 0938   CO2 31 02/16/2023 0938   BUN 19 02/16/2023 0938   CREATININE 0.90 02/16/2023 0938      Component Value Date/Time   CALCIUM 9.9 02/16/2023 0938   ALKPHOS 51 02/16/2023 0938   AST 34 02/16/2023 9061  ALT 35 02/16/2023 0938   BILITOT 0.7 02/16/2023 9061     The ASCVD Risk score (Arnett DK, et al., 2019) failed to calculate for the following reasons:   The valid total cholesterol range is 130 to 320 mg/dL  Assessment and Plan:   1. Diabetes, type 2: controlled A1c 6.0% decreased form 6.4%. FBG remain consistently at goal. As suspected, several low readings overnight on CGM report. Patient has recently self-reduced insulin  by 2 units. Will plan for further 2-unit reduction today per borderline sugars. Reported Regimen: Tresiba  40 units/d, Ozempic  2.0 mg/wk, Farxiga  10 mg/d, metformin  1000 mg BID Reduce Tresiba  to 38 units once daily If  continued low alarms more than twice in a week, reduce again to 38 units daily Reviewed Rule of 15   Follow Up Diabetes follow up via phone with PharmD 2-4 weeks  Patient given direct line for questions/concerns regarding Dexcom.   Future Appointments  Date Time Provider Department Center  09/28/2023  8:15 AM Okey Burns, MD [facility] None  02/20/2024  8:20 AM Bennett Reuben POUR, MD [facility]    Manuelita FABIENE Kobs, PharmD Clinical Pharmacist [facility] [phone]

## 2023-09-14 ENCOUNTER — Telehealth: Payer: Self-pay

## 2023-09-14 NOTE — Telephone Encounter (Signed)
 Received a refill request reorder form from [employer] on [employer],faxed provider portion to the office to sign and date and can be fax to [employer] [phone] or can fax back to [phone].

## 2023-09-28 ENCOUNTER — Ambulatory Visit (INDEPENDENT_AMBULATORY_CARE_PROVIDER_SITE_OTHER): Admitting: Otolaryngology

## 2023-09-28 ENCOUNTER — Encounter (INDEPENDENT_AMBULATORY_CARE_PROVIDER_SITE_OTHER): Payer: Self-pay | Admitting: Otolaryngology

## 2023-09-28 VITALS — BP 117/72 | HR 75

## 2023-09-28 DIAGNOSIS — R1310 Dysphagia, unspecified: Secondary | ICD-10-CM

## 2023-09-28 DIAGNOSIS — R0982 Postnasal drip: Secondary | ICD-10-CM

## 2023-09-28 DIAGNOSIS — R490 Dysphonia: Secondary | ICD-10-CM

## 2023-09-28 DIAGNOSIS — R0981 Nasal congestion: Secondary | ICD-10-CM | POA: Diagnosis not present

## 2023-09-28 DIAGNOSIS — J383 Other diseases of vocal cords: Secondary | ICD-10-CM

## 2023-09-28 DIAGNOSIS — R432 Parageusia: Secondary | ICD-10-CM

## 2023-09-28 DIAGNOSIS — K219 Gastro-esophageal reflux disease without esophagitis: Secondary | ICD-10-CM

## 2023-09-28 MED ORDER — LEVOCETIRIZINE DIHYDROCHLORIDE 5 MG PO TABS: 5.0000 mg | ORAL_TABLET | Freq: Every evening | ORAL | 3 refills | Status: AC

## 2023-09-28 MED ORDER — FLUTICASONE PROPIONATE 50 MCG/ACT NA SUSP: 2.0000 | Freq: Every day | NASAL | 6 refills | Status: AC

## 2023-09-28 NOTE — Progress Notes (Signed)
 ENT CONSULT:  Reason for Consult: chronic dysphonia  and dysphagia  HPI: Discussed the use of AI scribe software for clinical note transcription with the patient, who gave verbal consent to proceed.  History of Present Illness Aaron Zimmerman is a 73 year old male who presents with changes in his voice and episodes of choking on solid foods.   Over the past six months, he has experienced a weakening of his voice without associated pain. He has a history of taking omeprazole  for heartburn or reflux, but he is not currently on this medication.  He started taking Ozempic  approximately three to four months ago. He experiences episodes of choking when eating, which occur without a predictable pattern, with the most recent episode occurring last week.   He mentions an altered sense of taste, particularly noting that 'things taste like eggs'. He has not identified a specific cause for this change in taste.  No pain with talking.    Records Reviewed:  [facility] Manuelita Kobs [facility] note from 08/29/23 1. Diabetes, type 2: controlled A1c 6.0% decreased form 6.4%. FBG remain consistently at goal without low readings under 70 in the past week. Confirms reduction of Tresiba  to 42 units without significant rise in FBG.  Continue all medications today without change Follow up ~1-2 weeks to review first CGM data. Suspect ongoing reduction of insulin  given excellent glycemic control. Reviewed low BG (<70 mg/dL). Reviewed Rule of 15.     Past Medical History:  Diagnosis Date   Cataract right eye    Complication of anesthesia    slow to awaken after 2021 colonscopy   Diabetes mellitus Type 2    Hyperlipidemia    Personal history of COVID-19 07/2020   weakness & fatigue x 1-2 weeks took oral paxlovid all symptoms resolved   Personal history of urinary calculi 2012   Prostate Cancer (HCC)    Seasonal Allergies    Skin abnormality    bottom of left leg scratch healing well pt fell 3 weeks ago  per pt on 03-24-2021   Vitamin B12 deficiency    Wears glasses for reading     Past Surgical History:  Procedure Laterality Date   CATARACT EXTRACTION W/ INTRAOCULAR LENS IMPLANT Right 2023   COLONOSCOPY  2021   CYSTOSCOPY N/A 03/30/2021   Procedure: CYSTOSCOPY FLEXIBLE;  Surgeon: Rosalind Zachary NOVAK, MD;  Location: [facility];  Service: Urology;  Laterality: N/A;  No seeds detected in bladder per Dr. Rosalind   left eye cataract surgery     2-3 yrs ago per pt on 03-24-2021   POLYPECTOMY     RADIOACTIVE SEED IMPLANT N/A 03/30/2021   Procedure: RADIOACTIVE SEED IMPLANT/BRACHYTHERAPY IMPLANT;  Surgeon: Rosalind Zachary NOVAK, MD;  Location[facility];  Service: Urology;  Laterality: N/A;   SPACE OAR INSTILLATION N/A 03/30/2021   Procedure: SPACE OAR INSTILLATION;  Surgeon: Rosalind Zachary NOVAK, MD;  Location: [facility];  Service: Urology;  Laterality: N/A;   traumatic injury and repair of RT hand  1974    Family History  Problem Relation Age of Onset   Heart disease Mother 31       CAD/PCI with stents; cardiomyopathy with AICD   Cancer - Lung Father    Cancer Other        Colon and Prostate   Colon cancer Neg Hx    Colon polyps Neg Hx    Esophageal cancer Neg Hx    Rectal cancer  Neg Hx    Stomach cancer Neg Hx     Social History:  reports that he has never smoked. He has never been exposed to tobacco smoke. He quit smokeless tobacco use about 28 years ago.  His smokeless tobacco use included chew. He reports that he does not drink alcohol and does not use drugs.  Allergies:  Allergies  Allergen Reactions   Pioglitazone Rash    Actos rash and hot from waist up    Medications: I have reviewed the patient's current medications.  The PMH, PSH, Medications, Allergies, and SH were reviewed and updated.  ROS: Constitutional: Negative for fever, weight loss and weight gain. Cardiovascular: Negative for chest pain and dyspnea on  exertion. Respiratory: Is not experiencing shortness of breath at rest. Gastrointestinal: Negative for nausea and vomiting. Neurological: Negative for headaches. Psychiatric: The patient is not nervous/anxious  Blood pressure 117/72, pulse 75, SpO2 95%. There is no height or weight on file to calculate BMI.  PHYSICAL EXAM:  Exam: General: Well-developed, well-nourished Communication and Voice: Clear pitch and clarity Respiratory Respiratory effort: Equal inspiration and expiration without stridor Cardiovascular Peripheral Vascular: Warm extremities with equal color/perfusion Eyes: No nystagmus with equal extraocular motion bilaterally Neuro/Psych/Balance: Patient oriented to person, place, and time; Appropriate mood and affect; Gait is intact with no imbalance; Cranial nerves I-XII are intact Head and Face Inspection: Normocephalic and atraumatic without mass or lesion Palpation: Facial skeleton intact without bony stepoffs Salivary Glands: No mass or tenderness Facial Strength: Facial motility symmetric and full bilaterally ENT Pinna: External ear intact and fully developed External canal: Canal is patent with intact skin Tympanic Membrane: Clear and mobile External Nose: No scar or anatomic deformity Internal Nose: Septum is deviated to the left. No polyp, or purulence. Mucosal edema and erythema present.  Bilateral inferior turbinate hypertrophy.  Lips, Teeth, and gums: Mucosa and teeth intact and viable TMJ: No pain to palpation with full mobility Oral cavity/oropharynx: No erythema or exudate, no lesions present Nasopharynx: No mass or lesion with intact mucosa Hypopharynx: Intact mucosa without pooling of secretions Larynx Glottic: Full true vocal cord mobility without lesion or mass VF atrophy b/l  Supraglottic: Normal appearing epiglottis and AE folds Interarytenoid Space: Moderate pachydermia&edema Subglottic Space: Patent without lesion or edema Neck Neck and  Trachea: Midline trachea without mass or lesion Thyroid: No mass or nodularity Lymphatics: No lymphadenopathy  Procedure: Preoperative diagnosis: chronic dysphonia and dysphagia   Postoperative diagnosis:   Same+ VF atrophy and glottic insufficiency   Procedure: Flexible fiberoptic laryngoscopy  Surgeon: Elena Larry, MD  Anesthesia: Topical lidocaine  and Afrin Complications: None Condition is stable throughout exam  Indications and consent:  The patient presents to the clinic with above symptoms. Indirect laryngoscopy view was incomplete. Thus it was recommended that they undergo a flexible fiberoptic laryngoscopy. All of the risks, benefits, and potential complications were reviewed with the patient preoperatively and verbal informed consent was obtained.  Procedure: The patient was seated upright in the clinic. Topical lidocaine  and Afrin were applied to the nasal cavity. After adequate anesthesia had occurred, I then proceeded to pass the flexible telescope into the nasal cavity. The nasal cavity was patent without rhinorrhea or polyp. The nasopharynx was also patent without mass or lesion. The base of tongue was visualized and was normal. There were no signs of pooling of secretions in the piriform sinuses. The true vocal folds were mobile bilaterally. There were no signs of glottic or supraglottic mucosal lesion or mass. There was moderate  interarytenoid pachydermia and post cricoid edema. The telescope was then slowly withdrawn and the patient tolerated the procedure throughout.      Studies Reviewed: MBS 2018 Pt presents with adequate oral swallow, and mild pharyngeal dysphagia, characterized by slight pyriform sinus residue. This is likely caused by reduced relaxation of the cricopharyngeus, and could increase aspiration risk if residue fills pyrform sinus and spills into the airway. Small bites/sips and Dry swallow reduced amount of pyriform residue. No penetration or  aspiration was seen on any consistency.   Assessment/Plan: Encounter Diagnoses  Name Primary?   Dysphonia Yes   Dysphagia, unspecified type    Glottic insufficiency    Hoarseness    Age-related vocal fold atrophy    Chronic GERD     Assessment and Plan Assessment & Plan Chronic dysphonia  Scope exam with evidence of VF atrophy and glottic insufficiency, 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 We discussed benefits of voice therapy and he is willing to try it.  - Refer to speech therapy for voice therapy  Dysphagia and episodes of choking on solid foods  Intermittent choking with occasional swallowing difficulties. Normal OP swallow on MBS in 2018. Hx of GERD previously on Omeprazole .  - Order swallow study to evaluate swallowing mechanism. MBS esophagram ordered.   Altered sense of taste (parageusia) Altered taste possibly linked to smell changes or medication side effects. - Prescribed Flonase  for nasal congestion. - Prescribed Xyzal  5 mg daily for nasal congestion - Advised discussing medication side effects with pharmacist.  GERD LPR -  Reflux Gourmet after meals - diet and lifestyle changes to minimize GERD - Refer to [employer] blog for dietary and lifestyle modifications/reflux cook book  Chronic nasal congestion  Chronic nasal congestion and post-nasal drainage Evidence of post-nasal drainage during flexible scope exam today, could be contributing to her sx - trial of Xyzal  5 mg daily and Flonase  2 puffs b/l nares BID - consider nasal saline rinses   RTC 3 mo after MBS and esophagram and speech therapy  Thank you for allowing me to participate in the care of this patient. Please do not hesitate to contact me with any questions or concerns.   Elena Larry, MD Otolaryngology [facility] Phone: [phone] Fax: [phone]    09/28/2023, 8:44 AM

## 2023-09-28 NOTE — Patient Instructions (Signed)

## 2023-09-29 ENCOUNTER — Telehealth (HOSPITAL_COMMUNITY): Payer: Self-pay | Admitting: *Deleted

## 2023-09-29 NOTE — Telephone Encounter (Signed)
 Attempted to contact pt at [phone] to schedule the OP MBS. Left VM with request for callback. (AHARRIS)

## 2023-10-02 ENCOUNTER — Other Ambulatory Visit (HOSPITAL_COMMUNITY): Payer: Self-pay

## 2023-10-02 ENCOUNTER — Other Ambulatory Visit (INDEPENDENT_AMBULATORY_CARE_PROVIDER_SITE_OTHER): Payer: Self-pay | Admitting: Pharmacist

## 2023-10-02 DIAGNOSIS — Z794 Long term (current) use of insulin: Secondary | ICD-10-CM

## 2023-10-02 DIAGNOSIS — E119 Type 2 diabetes mellitus without complications: Secondary | ICD-10-CM

## 2023-10-02 DIAGNOSIS — Z7984 Long term (current) use of oral hypoglycemic drugs: Secondary | ICD-10-CM

## 2023-10-02 MED ORDER — DEXCOM G7 SENSOR MISC
3 refills | Status: AC
  Filled 2023-10-02: qty 9, 90d supply, fill #0
  Filled 2024-01-18: qty 9, 90d supply, fill #1

## 2023-10-02 NOTE — Progress Notes (Signed)
 Brief Telephone Documentation Reason for Call: Patient left message regarding question for pharmacist   Summary of Call: Patient placed last Dexcom sensor on Friday. Saturday his app told him the sensor failed. Has not contacted Dexcom Support.   Reports his pharmacy told him they will no longer be able to fill his Dexcom (unclear if misunderstanding, e.g. if refill is too soon to fill or if other reason).   [facility] claim at [facility] shows sensors can be filled today. $0 copay.   Called [facility] regarding Dexcom issue. They are getting a rejection stating the sensors must be billed under part B.    Follow Up: Patient given direct line for further questions/concerns.  Manuelita FABIENE Kobs, PharmD Clinical Pharmacist [facility] [phone]

## 2023-10-03 ENCOUNTER — Other Ambulatory Visit (HOSPITAL_COMMUNITY): Payer: Self-pay

## 2023-10-06 ENCOUNTER — Other Ambulatory Visit (HOSPITAL_COMMUNITY): Payer: Self-pay | Admitting: *Deleted

## 2023-10-06 DIAGNOSIS — R1310 Dysphagia, unspecified: Secondary | ICD-10-CM

## 2023-10-10 ENCOUNTER — Telehealth: Payer: Self-pay

## 2023-10-10 NOTE — Telephone Encounter (Signed)
 Received reorder form from [facility] [facility] to refill Ozempic , fill and fax to provider office to sign and date,can be fax to [employer] or can fax back to [phone].

## 2023-10-12 ENCOUNTER — Ambulatory Visit (HOSPITAL_COMMUNITY)

## 2023-10-13 ENCOUNTER — Other Ambulatory Visit (HOSPITAL_COMMUNITY)

## 2023-10-19 ENCOUNTER — Ambulatory Visit (HOSPITAL_COMMUNITY)
Admission: RE | Admit: 2023-10-19 | Discharge: 2023-10-19 | Disposition: A | Discharge status: Home / Self Care | Source: Ambulatory Visit | Attending: Internal Medicine | Admitting: Internal Medicine

## 2023-10-19 ENCOUNTER — Ambulatory Visit (HOSPITAL_COMMUNITY)
Admission: RE | Admit: 2023-10-19 | Discharge: 2023-10-19 | Disposition: A | Discharge status: Home / Self Care | Source: Ambulatory Visit | Attending: Otolaryngology | Admitting: Otolaryngology

## 2023-10-19 DIAGNOSIS — R1313 Dysphagia, pharyngeal phase: Secondary | ICD-10-CM | POA: Diagnosis not present

## 2023-10-19 DIAGNOSIS — R1310 Dysphagia, unspecified: Secondary | ICD-10-CM

## 2023-10-19 NOTE — Evaluation (Signed)
 Modified Barium Swallow Study  Patient Details  Name: Aaron Zimmerman. MRN: [id number] Date of Birth: 03-13-1950  Today's Date: 10/19/2023  Modified Barium Swallow completed.  Full report located under Chart Review in the Imaging Section.  History of Present Illness Aaron Zimmerman is an 73 y.o. male who was referred for an OP MBS. He describes weakening of his voice, sensation of food lodging in throat, occasional episodes of choking/coughing when eating, with no predictable pattern and at a frequency of 1-2x/week.  He underwent direct laryngoscopy 8/21: The base of tongue was visualized and was normal. There were no signs of pooling of secretions in the piriform sinuses. The true vocal folds were mobile bilaterally. There were no signs of glottic or supraglottic mucosal lesion or mass. There was moderate interarytenoid pachydermia and post cricoid edema. Dx VF atrophy and glottic insufficiency, 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 MBS 2018 with mild pharyngeal dysphagia, pyriform residue. Referral was made to OP SLP for voice therapy.  PMHx DM.   Clinical Impression Pt presents with functional oropharyngeal swallow - results were consistent with 2018 findings of pyriform sinus residue post-swallow.  There was adequate laryngeal vestibule closure; no aspiration; one episode of high penetration with nectar liquids (PAS 2, WFL). Partial distention of PES and pyriform residuals could explain sensation of food lodging in throat.  Liquid wash helped to clear solid residuals.  No SLP f/u for swallowing is recommended - continue current diet. Factors that may increase risk of adverse event in presence of aspiration Aaron Zimmerman & Aaron Zimmerman 2021):  (none)  Swallow Evaluation Recommendations Recommendations: PO diet PO Diet Recommendation: Regular;Thin liquids (Level 0) Liquid Administration via: Cup;Straw Medication Administration: Whole meds with liquid Supervision: Patient able to self-feed    Aaron Lehtinen L. Vona,  MA CCC/SLP Clinical Specialist - Acute Care SLP Acute Rehabilitation Services Office number [phone]   Aaron Zimmerman 10/19/2023,3:26 PM

## 2023-11-01 ENCOUNTER — Encounter: Payer: Self-pay | Admitting: Speech Pathology

## 2023-11-01 ENCOUNTER — Ambulatory Visit: Attending: Otolaryngology | Admitting: Speech Pathology

## 2023-11-01 DIAGNOSIS — R490 Dysphonia: Secondary | ICD-10-CM | POA: Insufficient documentation

## 2023-11-01 DIAGNOSIS — R1310 Dysphagia, unspecified: Secondary | ICD-10-CM | POA: Diagnosis present

## 2023-11-01 DIAGNOSIS — R498 Other voice and resonance disorders: Secondary | ICD-10-CM | POA: Diagnosis present

## 2023-11-01 NOTE — Therapy (Signed)
 OUTPATIENT SPEECH LANGUAGE PATHOLOGY VOICE EVALUATION   Patient Name: Aaron Zimmerman. MRN: [id number] DOB:[birth year], 73 y.o., male Today's Date: 11/01/2023  PCP: Jimmy Charlie FERNS, MD REFERRING PROVIDER: Okey Burns, MD  END OF SESSION:  End of Session - 11/01/23 1023     Visit Number 1          Past Medical History:  Diagnosis Date   Cataract right eye    Complication of anesthesia    slow to awaken after 2021 colonscopy   Diabetes mellitus Type 2    Hyperlipidemia    Personal history of COVID-19 07/2020   weakness & fatigue x 1-2 weeks took oral paxlovid all symptoms resolved   Personal history of urinary calculi 2012   Prostate Cancer (HCC)    Seasonal Allergies    Skin abnormality    bottom of left leg scratch healing well pt fell 3 weeks ago per pt on 03-24-2021   Vitamin B12 deficiency    Wears glasses for reading    Past Surgical History:  Procedure Laterality Date   CATARACT EXTRACTION W/ INTRAOCULAR LENS IMPLANT Right 2023   COLONOSCOPY  2021   CYSTOSCOPY N/A 03/30/2021   Procedure: CYSTOSCOPY FLEXIBLE;  Surgeon: Rosalind Zachary NOVAK, MD;  Location: [facility];  Service: Urology;  Laterality: N/A;  No seeds detected in bladder per Dr. Rosalind   left eye cataract surgery     2-3 yrs ago per pt on 03-24-2021   POLYPECTOMY     RADIOACTIVE SEED IMPLANT N/A 03/30/2021   Procedure: RADIOACTIVE SEED IMPLANT/BRACHYTHERAPY IMPLANT;  Surgeon: Rosalind Zachary NOVAK, MD;  Location: [facility];  Service: Urology;  Laterality: N/A;   SPACE OAR INSTILLATION N/A 03/30/2021   Procedure: SPACE OAR INSTILLATION;  Surgeon: Rosalind Zachary NOVAK, MD;  Location: [facility];  Service: Urology;  Laterality: N/A;   traumatic injury and repair of RT hand  1974   Patient Active Problem List   Diagnosis Date Noted   Change in voice 08/16/2023   Diabetes mellitus treated with insulin  and oral medication (HCC) 02/16/2023   Diabetes  mellitus treated with injections of non-insulin  medication (HCC) 02/16/2023   Esophageal dysphagia 02/01/2022   Trigger finger, left little finger 07/27/2021   History of prostate cancer 01/19/2021   Advance directive discussed with patient 12/15/2020   Osteoarthritis of both knees 04/22/2020   Essential hypertension 12/23/2016   Aortic atherosclerosis 11/10/2016   Vitamin B12 deficiency    Preventative health care 03/09/2013   Polyp of colon, adenomatous 03/06/2013   Solitary pulmonary nodule 07/22/2012   ALLERGIC RHINITIS, SEASONAL, MILD 04/28/2009   ONYCHOMYCOSIS, TOENAILS 03/02/2009   Acute cough 08/24/2007   Type 2 diabetes mellitus with other circulatory complications (HCC) 01/22/2007   Hyperlipemia 01/22/2007    Onset date: Referred on 09/28/23  REFERRING DIAG: R49.0 (ICD-10-CM) - Dysphonia   THERAPY DIAG:  Other voice and resonance disorders  Rationale for Evaluation and Treatment: Rehabilitation  SUBJECTIVE:   SUBJECTIVE STATEMENT: Pt was pleasant and cooperative throghout evaluation.   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 MBS 2018 with mild pharyngeal dysphagia, pyriform residue.   PAIN:  Are you having pain? No  FALLS: Has patient fallen in last 6 months? No, Number of falls: 0  LIVING ENVIRONMENT: Lives with: lives with their spouse; [place]  Lives in: House/apartment  PLOF:Level of assistance: Independent with ADLs, Independent with IADLs Employment: Retired; Works PT (3/week)  at hardware store  PATIENT GOALS: swallowing  OBJECTIVE:  Note: Objective measures were completed at Evaluation unless otherwise noted.  DIAGNOSTIC FINDINGS: Per EMR  ENT Chronic dysphonia  Scope exam with evidence of VF atrophy and glottic insufficiency, 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 We discussed benefits of voice therapy and he is willing to try it.  - Refer to speech therapy for voice therapy    Dysphagia and episodes of choking on solid foods  Intermittent choking with occasional swallowing difficulties. Normal OP swallow on MBS in 2018. Hx of GERD previously on Omeprazole .  - Order swallow study to evaluate swallowing mechanism. MBS esophagram ordered.    Altered sense of taste (parageusia) Altered taste possibly linked to smell changes or medication side effects. - Prescribed Flonase  for nasal congestion. - Prescribed Xyzal  5 mg daily for nasal congestion - Advised discussing medication side effects with pharmacist.   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  intake: suboptimal; 2 cups Caffeine/alcohol intake: Pot and a half of coffee and 1 diet coke  Daily voice use: moderate; mostly at work  PERCEPTUAL VOICE ASSESSMENT: Voice quality: hoarse Vocal abuse: habitual throat clearing and coughing Resonance: normal Respiratory function: thoracic breathing  OBJECTIVE VOICE ASSESSMENT: Maximum phonation time for sustained ah: - Conversational pitch average: - Hz Conversational pitch range: - Hz Conversational loudness average: - dB Conversational loudness range: - dB S/z ratio: - (Suggestive of dysfunction >1.0)  RECOMMENDATIONS FROM OBJECTIVE SWALLOW STUDY (MBSS/FEES):  10/19/23 Clinical Impression: Pt presents with functional oropharyngeal swallow - results were  consistent with 2018 findings of pyriform sinus residue post-swallow. There was adequate laryngeal vestibule closure; no aspiration; one episode of high penetration with nectar liquids (PAS 2, WFL). Partial distention of PES and pyriform residuals could explain sensation of food lodging in throat. Liquid wash helped to clear solid residuals. No SLP f/u for swallowing is recommended - continue current diet.   *ESOPHOGRAM (10/19/23)  IMPRESSION: Limited study. Single episode of aspiration seen with small amount of heavy barium, additional barium intake was stopped after this was seen. Esophagram otherwise unremarkable from limited evaluation.  CLINICAL SWALLOW ASSESSMENT:   Current diet: regular and thin liquids Dentition: adequate natural dentition Patient directly observed with POs: No Feeding: able to feed self Liquids provided by: To observe Oral phase signs and symptoms:  Pharyngeal phase signs and symptoms:  Comments:    PATIENT REPORTED OUTCOME MEASURES (PROM): V-RQOL: 27 and EAT-10: 9  TREATMENT DATE:    PATIENT EDUCATION: Education details: dysphonia, dysphagia, SLP ROLE Person educated: Patient Education method: Explanation Education comprehension: needs further education    GOALS: Goals reviewed with patient? Yes  SHORT TERM GOALS = LONG TERM GOALS DUE TO POC: Target date: 12/09/23  Pt will report use of safe swallow strategies Baseline: Goal status: INITIAL  2.  Pt will improve score on EAT-10 PROM Baseline:  Goal status: INITIAL  3.  Pt will demonstrate effortful swallow Baseline:  Goal status: INITIAL    ASSESSMENT:  CLINICAL IMPRESSION: Pt is a 73 yo male who presents to ST OP for dysphonia. Pt endorses re: hoarseness started in 2018, but was not consistent. Not feeling as concerned with voice, more concerned with having  difficulty swallowing. Pt has not ever completely lost her voice. Pt reports some chronic cough since 2018 - unproductive. He has reportedly has been diagnosed with LPR - silent reflux and post nasal drip. Pt was assessed using voice measures and PROM - see above. Pt and wife are mostly concerned with swallowing. Pt reports he has had altered sense of taste ~ year ago where now he cannot eat anything with egg in it. Pt is getting strangled with liquids and solids. Wife reports he struggles with meats getting stuck - just won't go down.  SLP observed consistent vocal hoarseness and strain when speaking. SLP rec skilled ST services to address unspecified dysphagia. *PT DECLINED WORKING ON DYSPHONIA AT THIS TIME.   OBJECTIVE IMPAIRMENTS: include voice disorder and dysphagia. These impairments are limiting patient from effectively communicating at home and in community and safety when swallowing. Factors affecting potential to achieve goals and functional outcome are NA.SABRA Patient will benefit from skilled SLP services to address above impairments and improve overall function.  REHAB POTENTIAL: Good  PLAN:  SLP FREQUENCY: 1x/week  SLP DURATION: 4 weeks  PLANNED INTERVENTIONS: Aspiration precaution training, Pharyngeal strengthening exercises, Diet toleration management , SLP instruction and feedback, Compensatory strategies, Patient/family education, and 07473 Treatment of swallowing function    [employer], CCC-SLP 11/01/2023, 10:24 AM

## 2023-11-06 ENCOUNTER — Ambulatory Visit: Admitting: Speech Pathology

## 2023-11-06 ENCOUNTER — Encounter: Payer: Self-pay | Admitting: Speech Pathology

## 2023-11-06 DIAGNOSIS — R498 Other voice and resonance disorders: Secondary | ICD-10-CM | POA: Diagnosis not present

## 2023-11-06 NOTE — Therapy (Signed)
 OUTPATIENT SPEECH LANGUAGE PATHOLOGY VOICE EVALUATION   Patient Name: Aaron Zimmerman. MRN: [id number] DOB:[birth year], 73 y.o., male Today's Date: 11/06/2023  PCP: Aaron Charlie FERNS, MD REFERRING PROVIDER: Okey Burns, MD  END OF SESSION:  End of Session - 11/06/23 0854     Visit Number 2    SLP Start Time 0850    SLP Stop Time  0925    SLP Time Calculation (min) 35 min    Activity Tolerance Patient tolerated treatment well          Past Medical History:  Diagnosis Date   Cataract right eye    Complication of anesthesia    slow to awaken after 2021 colonscopy   Diabetes mellitus Type 2    Hyperlipidemia    Personal history of COVID-19 07/2020   weakness & fatigue x 1-2 weeks took oral paxlovid all symptoms resolved   Personal history of urinary calculi 2012   Prostate Cancer (HCC)    Seasonal Allergies    Skin abnormality    bottom of left leg scratch healing well pt fell 3 weeks ago per pt on 03-24-2021   Vitamin B12 deficiency    Wears glasses for reading    Past Surgical History:  Procedure Laterality Date   CATARACT EXTRACTION W/ INTRAOCULAR LENS IMPLANT Right 2023   COLONOSCOPY  2021   CYSTOSCOPY N/A 03/30/2021   Procedure: CYSTOSCOPY FLEXIBLE;  Surgeon: Aaron Zachary NOVAK, MD;  Location: [facility];  Service: Urology;  Laterality: N/A;  No seeds detected in bladder per Dr. Rosalind   left eye cataract surgery     2-3 yrs ago per pt on 03-24-2021   POLYPECTOMY     RADIOACTIVE SEED IMPLANT N/A 03/30/2021   Procedure: RADIOACTIVE SEED IMPLANT/BRACHYTHERAPY IMPLANT;  Surgeon: Aaron Zachary NOVAK, MD;  Location: [facility];  Service: Urology;  Laterality: N/A;   SPACE OAR INSTILLATION N/A 03/30/2021   Procedure: SPACE OAR INSTILLATION;  Surgeon: Aaron Zachary NOVAK, MD;  Location: [facility];  Service: Urology;  Laterality: N/A;   traumatic injury and repair of RT hand  1974   Patient Active Problem List    Diagnosis Date Noted   Change in voice 08/16/2023   Diabetes mellitus treated with insulin  and oral medication (HCC) 02/16/2023   Diabetes mellitus treated with injections of non-insulin  medication (HCC) 02/16/2023   Esophageal dysphagia 02/01/2022   Trigger finger, left little finger 07/27/2021   History of prostate cancer 01/19/2021   Advance directive discussed with patient 12/15/2020   Osteoarthritis of both knees 04/22/2020   Essential hypertension 12/23/2016   Aortic atherosclerosis 11/10/2016   Vitamin B12 deficiency    Preventative health care 03/09/2013   Polyp of colon, adenomatous 03/06/2013   Solitary pulmonary nodule 07/22/2012   ALLERGIC RHINITIS, SEASONAL, MILD 04/28/2009   ONYCHOMYCOSIS, TOENAILS 03/02/2009   Acute cough 08/24/2007   Type 2 diabetes mellitus with other circulatory complications (HCC) 01/22/2007   Hyperlipemia 01/22/2007    Onset date: Referred on 09/28/23  REFERRING DIAG: R49.0 (ICD-10-CM) - Dysphonia   THERAPY DIAG:  Other voice and resonance disorders  Rationale for Evaluation and Treatment: Rehabilitation  SUBJECTIVE:   SUBJECTIVE STATEMENT: Reports he's been coughing more withOUT food   Pt accompanied by: significant other; [name]  PERTINENT HISTORY: Dx VF atrophy and glottic insufficiency, 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 MBS 2018 with mild pharyngeal dysphagia, pyriform residue.   PAIN:  Are you having pain? No  FALLS: Has patient fallen in last 6 months? No,  Number of falls: 0  LIVING ENVIRONMENT: Lives with: lives with their spouse; [place]  Lives in: House/apartment  PLOF:Level of assistance: Independent with ADLs, Independent with IADLs Employment: Retired; Works PT (3/week) at hardware store  PATIENT GOALS: swallowing  OBJECTIVE:  Note: Objective measures were completed at Evaluation unless otherwise noted.  DIAGNOSTIC FINDINGS: Per EMR  ENT Chronic dysphonia  Scope exam with evidence of VF atrophy and glottic insufficiency,  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 We discussed benefits of voice therapy and he is willing to try it.  - Refer to speech therapy for voice therapy   Dysphagia and episodes of choking on solid foods  Intermittent choking with occasional swallowing difficulties. Normal OP swallow on MBS in 2018. Hx of GERD previously on Omeprazole .  - Order swallow study to evaluate swallowing mechanism. MBS esophagram ordered.    Altered sense of taste (parageusia) Altered taste possibly linked to smell changes or medication side effects. - Prescribed Flonase  for nasal congestion. - Prescribed Xyzal  5 mg daily for nasal congestion - Advised discussing medication side effects with pharmacist.   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  intake: suboptimal; 2 cups Caffeine/alcohol intake: Pot and a half of coffee and 1 diet coke  Daily voice use: moderate; mostly at work  PERCEPTUAL VOICE ASSESSMENT: Voice quality: hoarse Vocal abuse: habitual throat clearing and coughing Resonance: normal Respiratory function: thoracic breathing  OBJECTIVE VOICE ASSESSMENT: Maximum phonation time for sustained ah: - Conversational pitch average: - Hz Conversational pitch range: - Hz Conversational loudness average: - dB Conversational loudness range: - dB S/z ratio: - (Suggestive of dysfunction >1.0)  RECOMMENDATIONS  FROM OBJECTIVE SWALLOW STUDY (MBSS/FEES):  10/19/23 Clinical Impression: Pt presents with functional oropharyngeal swallow - results were consistent with 2018 findings of pyriform sinus residue post-swallow. There was adequate laryngeal vestibule closure; no aspiration; one episode of high penetration with nectar liquids (PAS 2, WFL). Partial distention of PES and pyriform residuals could explain sensation of food lodging in throat. Liquid wash helped to clear solid residuals. No SLP f/u for swallowing is recommended - continue current diet.   *ESOPHOGRAM (10/19/23)  IMPRESSION: Limited study. Single episode of aspiration seen with small amount of heavy barium, additional barium intake was stopped after this was seen. Esophagram otherwise unremarkable from limited evaluation.  CLINICAL SWALLOW ASSESSMENT:   Current diet: regular and thin liquids Dentition: adequate natural dentition Patient directly observed with POs: No Feeding: able to feed self Liquids provided by: To observe Oral phase signs and symptoms:  Pharyngeal phase signs and symptoms:  Comments:     PATIENT REPORTED OUTCOME MEASURES (PROM): V-RQOL: 27 and EAT-10: 9  TREATMENT DATE:   11/06/23: Observed pt complete Yale Swallow Protocol - passed with no overt s/sx of aspiration. He reports he has been coughing more without food/drink this week. He reports he has been feeling more phlegm. He has been taking a pill for post nasal drip, but not flonase . SLP reviewed ENT recommendations to pt and printed them for him. Due to presence of aspiration seen on esophogram but not on MBS, SLP provided pt with a couple pharyngeal strengthening exercises re: effortful swallow and CTAR with 5 sec isometric hold. SLP also encouraged pt to try effortful swallow and/or liquid wash when sensing residue. Pt was able to complete  exercises independently. Pt is to bring food next session for observation.   PATIENT EDUCATION: Education details: dysphonia, dysphagia, SLP ROLE Person educated: Patient Education method: Explanation Education comprehension: needs further education    GOALS: Goals reviewed with patient? Yes  SHORT TERM GOALS = LONG TERM GOALS DUE TO POC: Target date: 12/09/23  Pt will report use of safe swallow strategies Baseline: Goal status: INITIAL  2.  Pt will improve score on EAT-10 PROM Baseline:  Goal status: INITIAL  3.  Pt will demonstrate effortful swallow Baseline:  Goal status: INITIAL    ASSESSMENT:  CLINICAL IMPRESSION: Pt is a 73 yo male who presents to ST OP for dysphonia. Pt endorses re: hoarseness started in 2018, but was not consistent. Not feeling as concerned with voice, more concerned with having difficulty swallowing. Pt has not ever completely lost her voice. Pt reports some chronic cough since 2018 - unproductive. He has reportedly has been diagnosed with LPR - silent reflux and post nasal drip. Pt was assessed using voice measures and PROM - see above. Pt and wife are mostly concerned with swallowing. Pt reports he has had altered sense of taste ~ year ago where now he cannot eat anything with egg in it. Pt is getting strangled with liquids and solids. Wife reports he struggles with meats getting stuck - just won't go down.  SLP observed consistent vocal hoarseness and strain when speaking. SLP rec skilled ST services to address unspecified dysphagia. *PT DECLINED WORKING ON DYSPHONIA AT THIS TIME.   OBJECTIVE IMPAIRMENTS: include voice disorder and dysphagia. These impairments are limiting patient from effectively communicating at home and in community and safety when swallowing. Factors affecting potential to achieve goals and functional outcome are NA.SABRA Patient will benefit from skilled SLP services to address above impairments and improve overall function.  REHAB  POTENTIAL: Good  PLAN:  SLP FREQUENCY: 1x/week  SLP DURATION: 4 weeks  PLANNED INTERVENTIONS: Aspiration precaution training, Pharyngeal strengthening exercises, Diet toleration management , SLP instruction and feedback, Compensatory strategies, Patient/family education, and 07473 Treatment of swallowing function    [employer], CCC-SLP 11/06/2023, 8:55 AM

## 2023-11-13 ENCOUNTER — Telehealth: Payer: Self-pay

## 2023-11-13 NOTE — Telephone Encounter (Signed)
 Gave pt a call pt is coming up due for reenrollemnt [place] Farxiga  ,pt answer but hung up will mail out pt portion and will faxed provider portion today.

## 2023-11-14 ENCOUNTER — Encounter: Payer: Self-pay | Admitting: Pharmacist

## 2023-11-15 ENCOUNTER — Telehealth: Payer: Self-pay

## 2023-11-15 NOTE — Telephone Encounter (Signed)
 Received patient assistance medications for patient.  Ozempic , Tresiba , and Needles  2boxes of needles  4 boxes of both Ozempic  and Tresiba   Medications have been placed in the refrigerator and labeled with patient information . Called patient someone picked up but didn't answer. Will call back at later time.

## 2023-11-15 NOTE — Telephone Encounter (Unsigned)
 Copied from CRM [id number]. Topic: General - Call Back - No Documentation >> Nov 15, 2023 11:51 AM Aaron Zimmerman wrote: Reason for CRM: Patient is calling back to return phone call to Joellen. [phone] (M)

## 2023-11-16 NOTE — Telephone Encounter (Signed)
 Patient informed he will be by next week to pick up.

## 2023-11-21 ENCOUNTER — Ambulatory Visit: Admitting: Speech Pathology

## 2023-11-21 NOTE — Telephone Encounter (Signed)
 Patient arrived in office today to pick up medication

## 2023-11-27 ENCOUNTER — Ambulatory Visit: Attending: Otolaryngology | Admitting: Speech Pathology

## 2023-11-27 ENCOUNTER — Encounter: Payer: Self-pay | Admitting: Speech Pathology

## 2023-11-27 DIAGNOSIS — R498 Other voice and resonance disorders: Secondary | ICD-10-CM | POA: Diagnosis present

## 2023-11-27 DIAGNOSIS — R1310 Dysphagia, unspecified: Secondary | ICD-10-CM | POA: Insufficient documentation

## 2023-11-27 NOTE — Therapy (Signed)
 OUTPATIENT SPEECH LANGUAGE PATHOLOGY VOICE TREATMENT   Patient Name: Aaron Zimmerman. MRN: [id number] DOB:[birth year], 73 y.o., male Today's Date: 11/27/2023  PCP: Jimmy Charlie FERNS, MD REFERRING PROVIDER: Okey Burns, MD  END OF SESSION:  End of Session - 11/27/23 0806     Visit Number 3    SLP Start Time 0800    SLP Stop Time  0840    SLP Time Calculation (min) 40 min    Activity Tolerance Patient tolerated treatment well          Past Medical History:  Diagnosis Date   Cataract right eye    Complication of anesthesia    slow to awaken after 2021 colonscopy   Diabetes mellitus Type 2    Hyperlipidemia    Personal history of COVID-19 07/2020   weakness & fatigue x 1-2 weeks took oral paxlovid all symptoms resolved   Personal history of urinary calculi 2012   Prostate Cancer (HCC)    Seasonal Allergies    Skin abnormality    bottom of left leg scratch healing well pt fell 3 weeks ago per pt on 03-24-2021   Vitamin B12 deficiency    Wears glasses for reading    Past Surgical History:  Procedure Laterality Date   CATARACT EXTRACTION W/ INTRAOCULAR LENS IMPLANT Right 2023   COLONOSCOPY  2021   CYSTOSCOPY N/A 03/30/2021   Procedure: CYSTOSCOPY FLEXIBLE;  Surgeon: Rosalind Zachary NOVAK, MD;  Location: [facility];  Service: Urology;  Laterality: N/A;  No seeds detected in bladder per Dr. Rosalind   left eye cataract surgery     2-3 yrs ago per pt on 03-24-2021   POLYPECTOMY     RADIOACTIVE SEED IMPLANT N/A 03/30/2021   Procedure: RADIOACTIVE SEED IMPLANT/BRACHYTHERAPY IMPLANT;  Surgeon: Rosalind Zachary NOVAK, MD;  Location: [facility];  Service: Urology;  Laterality: N/A;   SPACE OAR INSTILLATION N/A 03/30/2021   Procedure: SPACE OAR INSTILLATION;  Surgeon: Rosalind Zachary NOVAK, MD;  Location: [facility];  Service: Urology;  Laterality: N/A;   traumatic injury and repair of RT hand  1974   Patient Active Problem List    Diagnosis Date Noted   Change in voice 08/16/2023   Diabetes mellitus treated with insulin  and oral medication (HCC) 02/16/2023   Diabetes mellitus treated with injections of non-insulin  medication (HCC) 02/16/2023   Esophageal dysphagia 02/01/2022   Trigger finger, left little finger 07/27/2021   History of prostate cancer 01/19/2021   Advance directive discussed with patient 12/15/2020   Osteoarthritis of both knees 04/22/2020   Essential hypertension 12/23/2016   Aortic atherosclerosis 11/10/2016   Vitamin B12 deficiency    Preventative health care 03/09/2013   Polyp of colon, adenomatous 03/06/2013   Solitary pulmonary nodule 07/22/2012   ALLERGIC RHINITIS, SEASONAL, MILD 04/28/2009   ONYCHOMYCOSIS, TOENAILS 03/02/2009   Acute cough 08/24/2007   Type 2 diabetes mellitus with other circulatory complications (HCC) 01/22/2007   Hyperlipemia 01/22/2007    Onset date: Referred on 09/28/23  REFERRING DIAG: R49.0 (ICD-10-CM) - Dysphonia   THERAPY DIAG:  Other voice and resonance disorders  Dysphagia, unspecified type  Rationale for Evaluation and Treatment: Rehabilitation  SUBJECTIVE:   SUBJECTIVE STATEMENT: Pt reports he has been doing his exercises.   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 MBS 2018 with mild pharyngeal dysphagia, pyriform residue.   PAIN:  Are you having pain? No  FALLS: Has patient fallen  in last 6 months? No, Number of falls: 0  LIVING ENVIRONMENT: Lives with: lives with their spouse; [place]  Lives in: House/apartment  PLOF:Level of assistance: Independent with ADLs, Independent with IADLs Employment: Retired; Works PT (3/week) at hardware store  PATIENT GOALS: swallowing  OBJECTIVE:  Note: Objective measures were completed at Evaluation unless otherwise noted.  DIAGNOSTIC FINDINGS: Per EMR  ENT Chronic dysphonia  Scope exam with evidence of VF atrophy  and glottic insufficiency, 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 We discussed benefits of voice therapy and he is willing to try it.  - Refer to speech therapy for voice therapy   Dysphagia and episodes of choking on solid foods  Intermittent choking with occasional swallowing difficulties. Normal OP swallow on MBS in 2018. Hx of GERD previously on Omeprazole .  - Order swallow study to evaluate swallowing mechanism. MBS esophagram ordered.    Altered sense of taste (parageusia) Altered taste possibly linked to smell changes or medication side effects. - Prescribed Flonase  for nasal congestion. - Prescribed Xyzal  5 mg daily for nasal congestion - Advised discussing medication side effects with pharmacist.   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 Water  intake: suboptimal; 2 cups Caffeine/alcohol intake: Pot and a half of coffee and 1 diet coke  Daily voice use: moderate; mostly at work  PERCEPTUAL VOICE ASSESSMENT: Voice quality: hoarse Vocal abuse: habitual throat clearing and coughing Resonance: normal Respiratory function: thoracic breathing  OBJECTIVE VOICE ASSESSMENT: Maximum phonation time for sustained ah: - Conversational pitch average: - Hz Conversational pitch range: - Hz Conversational loudness average: - dB Conversational loudness range: - dB S/z ratio: - (Suggestive of dysfunction  >1.0)  RECOMMENDATIONS FROM OBJECTIVE SWALLOW STUDY (MBSS/FEES):  10/19/23 Clinical Impression: Pt presents with functional oropharyngeal swallow - results were consistent with 2018 findings of pyriform sinus residue post-swallow. There was adequate laryngeal vestibule closure; no aspiration; one episode of high penetration with nectar liquids (PAS 2, WFL). Partial distention of PES and pyriform residuals could explain sensation of food lodging in throat. Liquid wash helped to clear solid residuals. No SLP f/u for swallowing is recommended - continue current diet.   *ESOPHOGRAM (10/19/23)  IMPRESSION: Limited study. Single episode of aspiration seen with small amount of heavy barium, additional barium intake was stopped after this was seen. Esophagram otherwise unremarkable from limited evaluation.  CLINICAL SWALLOW ASSESSMENT:   Current diet: regular and thin liquids Dentition: adequate natural dentition Patient directly observed with POs: No Feeding: able to feed self Liquids provided by: To observe Oral phase signs and symptoms:  Pharyngeal phase signs and symptoms:  Comments:     PATIENT REPORTED OUTCOME MEASURES (PROM): V-RQOL: 27 and EAT-10: 9  TREATMENT DATE:   11/27/23: Pt was seen for skilled ST services targeting dysphagia. Pt reports he has been completing his exercises, drinking one bottle of water  a day whether I want to or not, and making a conscious effort to chew more. We briefly discussed safe swalow strategies and s/sx of aspiration. Overall, him and wife report less instances of coughing and feeling strangled since beginning exercises. Pt was observed with regular solids and water . Delayed throat clears noted. He reports feeling some of the cracker. Belch noted. Pt reported liquid wash was successful in removing sensed residue.  SLP educated on  acid reflux triggers and modifications. Pt is currently eating several foods that are known to aggravate reflux. SLP encouraged pt try to limit.  *Pt is still not using flonase .  11/06/23: Observed pt complete [facility] Swallow Protocol - passed with no overt s/sx of aspiration. He reports he has been coughing more without food/drink this week. He reports he has been feeling more phlegm. He has been taking a pill for post nasal drip, but not flonase . SLP reviewed ENT recommendations to pt and printed them for him. Due to presence of aspiration seen on esophogram but not on MBS, SLP provided pt with a couple pharyngeal strengthening exercises re: effortful swallow and CTAR with 5 sec isometric hold. SLP also encouraged pt to try effortful swallow and/or liquid wash when sensing residue. Pt was able to complete exercises independently. Pt is to bring food next session for observation.   PATIENT EDUCATION: Education details: dysphonia, dysphagia, SLP ROLE Person educated: Patient Education method: Explanation Education comprehension: needs further education    GOALS: Goals reviewed with patient? Yes  SHORT TERM GOALS = LONG TERM GOALS DUE TO POC: Target date: 12/09/23  Pt will report use of safe swallow strategies Baseline: Goal status: MET  2.  Pt will improve score on EAT-10 PROM Baseline:  Goal status: INITIAL  3.  Pt will demonstrate effortful swallow Baseline:  Goal status: MET    ASSESSMENT:  CLINICAL IMPRESSION: Pt is a 73 yo male who presents to ST OP for dysphonia. Pt endorses re: hoarseness started in 2018, but was not consistent. Not feeling as concerned with voice, more concerned with having difficulty swallowing. Pt has not ever completely lost her voice. Pt reports some chronic cough since 2018 - unproductive. He has reportedly has been diagnosed with LPR - silent reflux and post nasal drip. Pt was assessed using voice measures and PROM - see above. Pt and wife are mostly  concerned with swallowing. Pt reports he has had altered sense of taste ~ year ago where now he cannot eat anything with egg in it. Pt is getting strangled with liquids and solids. Wife reports he struggles with meats getting stuck - just won't go down.  SLP observed consistent vocal hoarseness and strain when speaking. SLP rec skilled ST services to address unspecified dysphagia. *PT DECLINED WORKING ON DYSPHONIA AT THIS TIME.   OBJECTIVE IMPAIRMENTS: include voice disorder and dysphagia. These impairments are limiting patient from effectively communicating at home and in community and safety when swallowing. Factors affecting potential to achieve goals and functional outcome are NA.SABRA Patient will benefit from skilled SLP services to address above impairments and improve overall function.  REHAB POTENTIAL: Good  PLAN:  SLP FREQUENCY: 1x/week  SLP DURATION: 4 weeks  PLANNED INTERVENTIONS: Aspiration precaution training, Pharyngeal strengthening exercises, Diet toleration management , SLP instruction and feedback, Compensatory strategies, Patient/family education, and [id number] Treatment of swallowing function  [employer], CCC-SLP 11/27/2023, 8:07 AM

## 2023-12-04 ENCOUNTER — Other Ambulatory Visit: Payer: Self-pay

## 2023-12-04 MED ORDER — METFORMIN HCL 1000 MG PO TABS: 1000.0000 mg | ORAL_TABLET | Freq: Two times a day (BID) | ORAL | 0 refills | Status: AC

## 2023-12-05 NOTE — Telephone Encounter (Signed)
 Received pt portion AZ&ME Farxiga  back today waiting on provider portion, re faxed provider portion today.

## 2023-12-07 ENCOUNTER — Ambulatory Visit: Admitting: Speech Pathology

## 2023-12-15 ENCOUNTER — Ambulatory Visit: Attending: Otolaryngology | Admitting: Speech Pathology

## 2023-12-15 ENCOUNTER — Encounter: Payer: Self-pay | Admitting: Speech Pathology

## 2023-12-15 DIAGNOSIS — R1310 Dysphagia, unspecified: Secondary | ICD-10-CM | POA: Insufficient documentation

## 2023-12-15 DIAGNOSIS — R498 Other voice and resonance disorders: Secondary | ICD-10-CM | POA: Diagnosis present

## 2023-12-15 NOTE — Therapy (Signed)
 OUTPATIENT SPEECH LANGUAGE PATHOLOGY VOICE TREATMENT & RECERTIFICATION   Patient Name: Aaron Zimmerman. MRN: [id number] DOB:September 12, 1950, 73 y.o., male Today's Date: 12/15/2023  PCP: Jimmy Charlie FERNS, MD REFERRING PROVIDER: Okey Burns, MD  END OF SESSION:  End of Session - 12/15/23 0807     Visit Number 4    SLP Start Time 0800    SLP Stop Time  0840    SLP Time Calculation (min) 40 min    Activity Tolerance Patient tolerated treatment well          Past Medical History:  Diagnosis Date   Cataract right eye    Complication of anesthesia    slow to awaken after 2021 colonscopy   Diabetes mellitus Type 2    Hyperlipidemia    Personal history of COVID-19 07/2020   weakness & fatigue x 1-2 weeks took oral paxlovid all symptoms resolved   Personal history of urinary calculi 2012   Prostate Cancer (HCC)    Seasonal Allergies    Skin abnormality    bottom of left leg scratch healing well pt fell 3 weeks ago per pt on 03-24-2021   Vitamin B12 deficiency    Wears glasses for reading    Past Surgical History:  Procedure Laterality Date   CATARACT EXTRACTION W/ INTRAOCULAR LENS IMPLANT Right 2023   COLONOSCOPY  2021   CYSTOSCOPY N/A 03/30/2021   Procedure: CYSTOSCOPY FLEXIBLE;  Surgeon: Rosalind Zachary NOVAK, MD;  Location: [facility];  Service: Urology;  Laterality: N/A;  No seeds detected in bladder per Dr. Rosalind   left eye cataract surgery     2-3 yrs ago per pt on 03-24-2021   POLYPECTOMY     RADIOACTIVE SEED IMPLANT N/A 03/30/2021   Procedure: RADIOACTIVE SEED IMPLANT/BRACHYTHERAPY IMPLANT;  Surgeon: Rosalind Zachary NOVAK, MD;  Location: [facility];  Service: Urology;  Laterality: N/A;   SPACE OAR INSTILLATION N/A 03/30/2021   Procedure: SPACE OAR INSTILLATION;  Surgeon: Rosalind Zachary NOVAK, MD;  Location: [facility];  Service: Urology;  Laterality: N/A;   traumatic injury and repair of RT hand  1974   Patient Active  Problem List   Diagnosis Date Noted   Change in voice 08/16/2023   Diabetes mellitus treated with insulin  and oral medication (HCC) 02/16/2023   Diabetes mellitus treated with injections of non-insulin  medication (HCC) 02/16/2023   Esophageal dysphagia 02/01/2022   Trigger finger, left little finger 07/27/2021   History of prostate cancer 01/19/2021   Advance directive discussed with patient 12/15/2020   Osteoarthritis of both knees 04/22/2020   Essential hypertension 12/23/2016   Aortic atherosclerosis 11/10/2016   Vitamin B12 deficiency    Preventative health care 03/09/2013   Polyp of colon, adenomatous 03/06/2013   Solitary pulmonary nodule 07/22/2012   ALLERGIC RHINITIS, SEASONAL, MILD 04/28/2009   ONYCHOMYCOSIS, TOENAILS 03/02/2009   Acute cough 08/24/2007   Type 2 diabetes mellitus with other circulatory complications (HCC) 01/22/2007   Hyperlipemia 01/22/2007    Onset date: Referred on 09/28/23  REFERRING DIAG: R49.0 (ICD-10-CM) - Dysphonia   THERAPY DIAG:  Other voice and resonance disorders  Dysphagia, unspecified type  Rationale for Evaluation and Treatment: Rehabilitation  SUBJECTIVE:   SUBJECTIVE STATEMENT: Pt reports he has been doing his exercises.   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 MBS 2018 with mild pharyngeal dysphagia, pyriform residue.   PAIN:  Are you having pain? No  FALLS: Has  patient fallen in last 6 months? No, Number of falls: 0  LIVING ENVIRONMENT: Lives with: lives with their spouse; [place]  Lives in: House/apartment  PLOF:Level of assistance: Independent with ADLs, Independent with IADLs Employment: Retired; Works PT (3/week) at hardware store  PATIENT GOALS: swallowing  OBJECTIVE:  Note: Objective measures were completed at Evaluation unless otherwise noted.  DIAGNOSTIC FINDINGS: Per EMR  ENT Chronic dysphonia  Scope exam with evidence  of VF atrophy and glottic insufficiency, 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 We discussed benefits of voice therapy and he is willing to try it.  - Refer to speech therapy for voice therapy   Dysphagia and episodes of choking on solid foods  Intermittent choking with occasional swallowing difficulties. Normal OP swallow on MBS in 2018. Hx of GERD previously on Omeprazole .  - Order swallow study to evaluate swallowing mechanism. MBS esophagram ordered.    Altered sense of taste (parageusia) Altered taste possibly linked to smell changes or medication side effects. - Prescribed Flonase  for nasal congestion. - Prescribed Xyzal  5 mg daily for nasal congestion - Advised discussing medication side effects with pharmacist.   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 Water  intake: suboptimal; 2 cups Caffeine/alcohol intake: Pot and a half of coffee and 1 diet coke  Daily voice use: moderate; mostly at work  PERCEPTUAL VOICE ASSESSMENT: Voice quality: hoarse Vocal abuse: habitual throat clearing and coughing Resonance: normal Respiratory function: thoracic breathing  OBJECTIVE VOICE ASSESSMENT: Maximum phonation time for sustained ah: - Conversational pitch average: - Hz Conversational pitch range: - Hz Conversational loudness average: - dB Conversational loudness range: - dB S/z ratio: - (Suggestive of  dysfunction >1.0)  RECOMMENDATIONS FROM OBJECTIVE SWALLOW STUDY (MBSS/FEES):  10/19/23 Clinical Impression: Pt presents with functional oropharyngeal swallow - results were consistent with 2018 findings of pyriform sinus residue post-swallow. There was adequate laryngeal vestibule closure; no aspiration; one episode of high penetration with nectar liquids (PAS 2, WFL). Partial distention of PES and pyriform residuals could explain sensation of food lodging in throat. Liquid wash helped to clear solid residuals. No SLP f/u for swallowing is recommended - continue current diet.   *ESOPHOGRAM (10/19/23)  IMPRESSION: Limited study. Single episode of aspiration seen with small amount of heavy barium, additional barium intake was stopped after this was seen. Esophagram otherwise unremarkable from limited evaluation.  CLINICAL SWALLOW ASSESSMENT:   Current diet: regular and thin liquids Dentition: adequate natural dentition Patient directly observed with POs: No Feeding: able to feed self Liquids provided by: To observe Oral phase signs and symptoms:  Pharyngeal phase signs and symptoms:  Comments:     PATIENT REPORTED OUTCOME MEASURES (PROM): V-RQOL: 27 and EAT-10: 9  TREATMENT DATE:   12/15/23: Pt was seen for skilled ST services targeting dysphagia. Pt reports he has been completing his exercises at home, has started his flonase , and identified that tomatoes ARE a big trigger for acid reflux. He reports after starting flonase , he has not had any hoarseness (though this is not something he wanted to address at eval). He also reports he has stopped drinking tomato juice because he was surprised to find this really triggered his acid reflux. He reports he coughs with liquids maybe once every 3 days. He does report that he reads and watches TV many times when these instances are  occurring - SLP recommended eliminating distractions as a strategy. Pt was observed with water  - no overt s/sx of aspiration noted. He was able to demonstrate effortful swallow with sips of water  independently. SLP to recert for 2 additional sessions by 02/08/23 to check in with patient regarding sx. Pt sees ENT at end of Nov. SLP to see for follow up education and determine if there are new recommendations.   11/27/23: Pt was seen for skilled ST services targeting dysphagia. Pt reports he has been completing his exercises, drinking one bottle of water  a day whether I want to or not, and making a conscious effort to chew more. We briefly discussed safe swalow strategies and s/sx of aspiration. Overall, him and wife report less instances of coughing and feeling strangled since beginning exercises. Pt was observed with regular solids and water . Delayed throat clears noted. He reports feeling some of the cracker. Belch noted. Pt reported liquid wash was successful in removing sensed residue.  SLP educated on acid reflux triggers and modifications. Pt is currently eating several foods that are known to aggravate reflux. SLP encouraged pt try to limit.  *Pt is still not using flonase .  11/06/23: Observed pt complete [facility] Swallow Protocol - passed with no overt s/sx of aspiration. He reports he has been coughing more without food/drink this week. He reports he has been feeling more phlegm. He has been taking a pill for post nasal drip, but not flonase . SLP reviewed ENT recommendations to pt and printed them for him. Due to presence of aspiration seen on esophogram but not on MBS, SLP provided pt with a couple pharyngeal strengthening exercises re: effortful swallow and CTAR with 5 sec isometric hold. SLP also encouraged pt to try effortful swallow and/or liquid wash when sensing residue. Pt was able to complete exercises independently. Pt is to bring food next session for observation.   PATIENT  EDUCATION: Education details: dysphonia, dysphagia, SLP ROLE Person educated: Patient Education method: Explanation Education comprehension: needs further education    GOALS: Goals reviewed with patient? Yes  SHORT TERM GOALS = LONG TERM GOALS DUE TO POC: Target date: 12/09/23  Pt will report use of safe swallow strategies Baseline: Goal status: MET  2.  Pt will improve score on EAT-10 PROM Baseline:  Goal status: INITIAL; NOT MET  3.  Pt will demonstrate effortful swallow Baseline:  Goal status: MET    ASSESSMENT:  CLINICAL IMPRESSION: Pt is a 73 yo male who presented to ST OP for dysphonia. See tx note. SLP recommended recert for 2 additional sessions to ensure carry over. Pt sees ENT at end of the month. Overall, pt has reported improvement. Putting on hold until December.   @ Eval: Pt endorses re: hoarseness started in 2018, but was not consistent. Not feeling as concerned with voice, more concerned with having difficulty swallowing. Pt has not  ever completely lost her voice. Pt reports some chronic cough since 2018 - unproductive. He has reportedly has been diagnosed with LPR - silent reflux and post nasal drip. Pt was assessed using voice measures and PROM - see above. Pt and wife are mostly concerned with swallowing. Pt reports he has had altered sense of taste ~ year ago where now he cannot eat anything with egg in it. Pt is getting strangled with liquids and solids. Wife reports he struggles with meats getting stuck - just won't go down.  SLP observed consistent vocal hoarseness and strain when speaking. SLP rec skilled ST services to address unspecified dysphagia. *PT DECLINED WORKING ON DYSPHONIA AT THIS TIME.   OBJECTIVE IMPAIRMENTS: include voice disorder and dysphagia. These impairments are limiting patient from effectively communicating at home and in community and safety when swallowing. Factors affecting potential to achieve goals and functional outcome are NA.SABRA  Patient will benefit from skilled SLP services to address above impairments and improve overall function.  REHAB POTENTIAL: Good  PLAN:  SLP FREQUENCY: 2x/month  SLP DURATION: 8 weeks  PLANNED INTERVENTIONS: Aspiration precaution training, Pharyngeal strengthening exercises, Diet toleration management , SLP instruction and feedback, Compensatory strategies, Patient/family education, and 07473 Treatment of swallowing function    [employer], CCC-SLP 12/15/2023, 8:08 AM

## 2023-12-20 ENCOUNTER — Telehealth (INDEPENDENT_AMBULATORY_CARE_PROVIDER_SITE_OTHER): Payer: Self-pay

## 2023-12-20 ENCOUNTER — Telehealth: Payer: Self-pay

## 2023-12-20 ENCOUNTER — Encounter (INDEPENDENT_AMBULATORY_CARE_PROVIDER_SITE_OTHER): Payer: Self-pay

## 2023-12-20 NOTE — Telephone Encounter (Signed)
 LVM & sent MyChart message for patient to call back to reschedule 01/02/2024 appointment - Provider out of office

## 2023-12-20 NOTE — Telephone Encounter (Signed)
 Gave pt a call he is coming up due for re-enrollment on spreadsheet ,spoke with pt explain he has already mail back pap for Novo Norisk could not tell who did he mail it too, pt also said he has been approved for [facility]ME Farxiga  thru 212/31/2026.

## 2023-12-20 NOTE — Telephone Encounter (Signed)
 Double click do not needed to be open

## 2023-12-28 ENCOUNTER — Encounter (INDEPENDENT_AMBULATORY_CARE_PROVIDER_SITE_OTHER): Payer: Self-pay

## 2023-12-28 ENCOUNTER — Ambulatory Visit (INDEPENDENT_AMBULATORY_CARE_PROVIDER_SITE_OTHER)

## 2023-12-28 VITALS — BP 107/64 | HR 68 | Temp 97.4°F

## 2023-12-28 DIAGNOSIS — K219 Gastro-esophageal reflux disease without esophagitis: Secondary | ICD-10-CM

## 2023-12-28 DIAGNOSIS — J383 Other diseases of vocal cords: Secondary | ICD-10-CM

## 2023-12-28 DIAGNOSIS — J342 Deviated nasal septum: Secondary | ICD-10-CM | POA: Diagnosis not present

## 2023-12-28 DIAGNOSIS — R490 Dysphonia: Secondary | ICD-10-CM | POA: Diagnosis not present

## 2023-12-28 DIAGNOSIS — J309 Allergic rhinitis, unspecified: Secondary | ICD-10-CM

## 2023-12-28 DIAGNOSIS — R1310 Dysphagia, unspecified: Secondary | ICD-10-CM | POA: Diagnosis not present

## 2023-12-28 MED ORDER — OMEPRAZOLE 20 MG PO CPDR
20.0000 mg | DELAYED_RELEASE_CAPSULE | Freq: Every day | ORAL | 3 refills | Status: AC
Start: 2023-12-28 — End: ?

## 2023-12-28 NOTE — Progress Notes (Signed)
 Dear Dr. Jimmy, Here is my assessment for our mutual patient, Aaron Zimmerman. Thank you for allowing me the opportunity to care for your patient. Please do not hesitate to contact me should you have any other questions. Sincerely, Dr. Penne Croak  Otolaryngology Clinic Note Referring provider: Dr. Jimmy HPI:  Discussed the use of AI scribe software for clinical note transcription with the patient, who gave verbal consent to proceed. History of Present Illness Aaron Zimmerman is a 73 year old male with dysphagia and acid reflux who presents with increased coughing and swallowing difficulties.  Dysphagia and cough - Increased coughing and swallowing difficulties over the past 1.5 days - Swallowing generally well-managed with therapy and exercises, but occasionally experiences sensation of food getting 'choked' - Hard swallowing exercises previously beneficial, but less effective recently - Speech therapist recommended swallowing exercises, including use of a ball under chin and hard swallowing maneuvers - Performs swallowing exercises regularly: three sets of ten once daily, with additional practice throughout the day  Gastroesophageal reflux symptoms - History of acid reflux, triggered by tomatoes and tomato juice - Continues to consume a pot and a half of caffeinated coffee daily - Not taking acid reducers such as omeprazole  or pantoprazole  Hearing impairment - Decreased hearing compared to baseline  Nasal and allergic symptoms - not taking xyzal   - using flonase  with intermittent use  Diabetes management - On Ozempic  for diabetes - A1c reduced to 6.0 - Insulin  requirement decreased from 55 units to 34-36 units daily  Substance use history - History of cigar smoking and chewing tobacco, quit approximately 30 years ago  Hydration preferences - Dislikes drinking water , prefers coffee or tea  Independent Review of Additional Tests or Records:  Reviewed  external note from referring PCP, [place],describing relevant history incorporated into todays evaluation. I personally reviewed Dr. Vallery note.   Esophagram - limited study. Small episode of aspiration MBS- penetration without aspiration. Small piriform residue  PMH/Meds/All/SocHx/FamHx/ROS:   Past Medical History:  Diagnosis Date   Cataract right eye    Complication of anesthesia    slow to awaken after 2021 colonscopy   Diabetes mellitus Type 2    Hyperlipidemia    Personal history of COVID-19 07/2020   weakness & fatigue x 1-2 weeks took oral paxlovid all symptoms resolved   Personal history of urinary calculi 2012   Prostate Cancer (HCC)    Seasonal Allergies    Skin abnormality    bottom of left leg scratch healing well pt fell 3 weeks ago per pt on 03-24-2021   Vitamin B12 deficiency    Wears glasses for reading      Past Surgical History:  Procedure Laterality Date   CATARACT EXTRACTION W/ INTRAOCULAR LENS IMPLANT Right 2023   COLONOSCOPY  2021   CYSTOSCOPY N/A 03/30/2021   Procedure: CYSTOSCOPY FLEXIBLE;  Surgeon: Rosalind Zachary NOVAK, MD;  Location: [facility];  Service: Urology;  Laterality: N/A;  No seeds detected in bladder per Dr. Rosalind   left eye cataract surgery     2-3 yrs ago per pt on 03-24-2021   POLYPECTOMY     RADIOACTIVE SEED IMPLANT N/A 03/30/2021   Procedure: RADIOACTIVE SEED IMPLANT/BRACHYTHERAPY IMPLANT;  Surgeon: Rosalind Zachary NOVAK, MD;  Location: [facility];  Service: Urology;  Laterality: N/A;   SPACE OAR INSTILLATION N/A 03/30/2021   Procedure: SPACE OAR INSTILLATION;  Surgeon: Rosalind Zachary NOVAK, MD;  Location: [facility];  Service: Urology;  Laterality:  N/A;   traumatic injury and repair of RT hand  1974    Family History  Problem Relation Age of Onset   Heart disease Mother 34       CAD/PCI with stents; cardiomyopathy with AICD   Cancer - Lung Father    Cancer Other        Colon and  Prostate   Colon cancer Neg Hx    Colon polyps Neg Hx    Esophageal cancer Neg Hx    Rectal cancer Neg Hx    Stomach cancer Neg Hx      Social Connections: Not on file      Current Outpatient Medications:    Accu-Chek Softclix Lancets lancets, USE TO CHECK BLOOD SUGAR ONCE DAILY, Disp: 100 each, Rfl: 5   cetirizine  (ZYRTEC ) 10 MG tablet, Take 1 tablet (10 mg total) by mouth daily as needed for allergies., Disp: 90 tablet, Rfl: 3   Continuous Glucose Sensor (DEXCOM G7 SENSOR) MISC, Place a new sensor every 10 days to continuously monitor blood sugar. E11.65. Z79.4, Disp: 9 each, Rfl: 3   dapagliflozin  propanediol (FARXIGA ) 10 MG TABS tablet, Take 1 tablet (10 mg total) by mouth daily., Disp: 90 tablet, Rfl: 3   fluticasone  (FLONASE ) 50 MCG/ACT nasal spray, Place 2 sprays into both nostrils daily., Disp: 16 g, Rfl: 6   glucose blood (ACCU-CHEK GUIDE TEST) test strip, USE TO CHECK BLOOD SUGAR ONCE DAILY, Disp: 100 strip, Rfl: 11   guaiFENesin -codeine  100-10 MG/5ML syrup, Take 5-10 mLs by mouth at bedtime., Disp: 100 mL, Rfl: 0   Insulin  Pen Needle (PEN NEEDLES) 32G X 6 MM MISC, 1 each by Does not apply route daily., Disp: 100 each, Rfl: 3   levocetirizine (XYZAL  ALLERGY 24HR) 5 MG tablet, Take 1 tablet (5 mg total) by mouth every evening., Disp: 30 tablet, Rfl: 3   lisinopril  (ZESTRIL ) 10 MG tablet, TAKE ONE TABLET BY MOUTH ONCE A DAY, Disp: 90 tablet, Rfl: 3   metFORMIN  (GLUCOPHAGE ) 1000 MG tablet, Take 1 tablet (1,000 mg total) by mouth 2 (two) times daily with a meal., Disp: 180 tablet, Rfl: 0   Multiple Vitamin (MULTIVITAMIN WITH MINERALS) TABS tablet, Take 1 tablet by mouth daily., Disp: , Rfl:    omeprazole  (PRILOSEC) 20 MG capsule, Take 1 capsule (20 mg total) by mouth daily., Disp: 30 capsule, Rfl: 3   Semaglutide , 2 MG/DOSE, (OZEMPIC , 2 MG/DOSE,) 8 MG/3ML SOPN, Inject 2 mg into the skin once a week., Disp: , Rfl:    simvastatin  (ZOCOR ) 20 MG tablet, TAKE ONE TABLET BY MOUTH EVERY  NIGHT AT BEDTIME, Disp: 90 tablet, Rfl: 3   TRESIBA  FLEXTOUCH 100 UNIT/ML FlexTouch Pen, INJECT 50 UNITS INTO THE SKIN DAILY, Disp: 45 mL, Rfl: 3   Physical Exam:   BP 107/64   Pulse 68   Temp (!) 97.4 F (36.3 C)   SpO2 94%   The patient was awake, alert, and appropriate. The external ears were inspected, and otoscopy was performed to evaluate the external auditory canals and tympanic membranes. The nasal cavity and septum were examined for mucosal changes, obstruction, or discharge. The oral cavity and oropharynx were inspected for mucosal lesions, infection, or tonsillar hypertrophy. The neck was palpated for lymphadenopathy, thyroid abnormalities, or other masses. Cranial nerve function was grossly intact.  Pertinent Findings: Physical Exam HEENT: Atraumatic, normocephalic. Oral cavity normal. Nasal mucosa normal. Nasal septum deviated. Pharynx normal with pharyngeal redness observed.   Seprately Identifiable Procedures:  I personally ordered, reviewed and  interpreted the following with the patient today  Given the patient's symptoms and incomplete visualization of critical sinonasal areas with anterior rhinoscopy, a separately performed diagnostic nasal endoscopy procedure is indicated for a complete rhinologic evaluation per American Rhinologic Society recommendations (https://[email or web address]-rhinologic.org/position-statements)  I personally ordered, reviewed and interpreted the following with the patient today  Procedure Note Diagnostic Nasal Endoscopy CPT CODE -- 68768 - Mod 25  Prior to initiating any procedures, risks/benefits/alternatives were explained to the patient and verbal consent obtained.  Pre-procedure diagnosis: Concern for sinusitis Post-procedure diagnosis: same Indication: See pre-procedure diagnosis and physical exam above Complications: None apparent EBL: 0 mL Anesthesia: Lidocaine  4% and topical decongestant was topically sprayed in each nasal  cavity  Description of Procedure:  Patient was identified. A flexible fiberoptic endoscope was utilized to evaluate the sinonasal cavities, mucosa, sinus ostia and turbinates and septum.  Overall, signs of mucosal inflammation are noted.  Also noted are deviated nasal septum.  No mucopurulence, polyps, or masses noted.   Right Middle meatus: clear Right SE Recess: clear Left MM: clear Left SE Recess: clear Photodocumentation was obtained.     Procedure Note Pre-procedure diagnosis:  Dysphagia Post-procedure diagnosis: Same Procedure: Transnasal Fiberoptic Laryngoscopy, CPT 31575 - Mod 25 Indication: dysphagia Complications: None apparent EBL: 0 mL  The procedure was undertaken to further evaluate the patient's complaint of dysphagia, with mirror exam inadequate for appropriate examination due to gag reflex and poor patient tolerance  Procedure:  Patient was identified as correct patient. Verbal consent was obtained. The nose was sprayed with oxymetazoline and 4% lidocaine . The The flexible laryngoscope was passed through the nose to view the nasal cavity, pharynx (oropharynx, hypopharynx) and larynx.  The larynx was examined at rest and during multiple phonatory tasks. Documentation was obtained and reviewed with patient. The scope was removed. The patient tolerated the procedure well.  Findings: The nasal cavity and nasopharynx did not reveal any masses or lesions, mucosa appeared to be without obvious lesions. The tongue base, pharyngeal walls, piriform sinuses, vallecula, epiglottis and postcricoid region are normal in appearance EXCEPT: interarytnoid edema and erythema. The visualized portion of the subglottis and proximal trachea is widely patent. The vocal folds are mobile bilaterally. There are no lesions on the free edge of the vocal folds nor elsewhere in the larynx worrisome for malignancy.    Electronically signed by: Penne Croak, DO 01/04/2024 10:01 AM   Impression &  Plans:  Aaron Zimmerman is a 73 y.o. male  1. Dysphagia, unspecified type   2. Dysphonia   3. Glottic insufficiency   4. Chronic GERD   5. Age-related vocal fold atrophy    - Findings and diagnoses discussed in detail with the patient. - Risks, benefits, and alternatives were reviewed. Through shared decision making, the patient elects to proceed with below. Assessment & Plan Oropharyngeal dysphagia Intermittent choking with liquids indicates aspiration risk. Swallowing exercises beneficial but recent increase in choking episodes. Risk of aspiration and pneumonia if unmanaged. - Continue swallowing exercises, including hard swallowing and ball exercises, frequently. - Attend one more speech therapy session before discontinuing therapy. - Reduce caffeine intake to half a cup of coffee per day.  Vocal cord weakness with dysphonia Vocal cord weakness causing dysphonia. Exercises beneficial. Injection therapy discussed but not preferred. - Continue vocal cord strengthening exercises.  Gastroesophageal reflux disease (GERD) GERD with mild esophageal redness. Symptoms worsened by dietary triggers. Omeprazole  recommended to manage symptoms and prevent esophageal damage. - Start omeprazole , available OTC or by prescription. - Avoid  dietary triggers such as tomatoes and reduce caffeine intake.  - Orders placed: No orders of the defined types were placed in this encounter.  - Medications prescribed/continued/adjusted:  Meds ordered this encounter  Medications   omeprazole  (PRILOSEC) 20 MG capsule    Sig: Take 1 capsule (20 mg total) by mouth daily.    Dispense:  30 capsule    Refill:  3   - Education materials provided to the patient. - Follow up:  weeks. Patient instructed to return sooner or go to the ED if new/worsening symptoms develop.   Thank you for allowing me the opportunity to care for your patient. Please do not hesitate to contact me should you have any other  questions.  Sincerely, Penne Croak, DO Otolaryngologist (ENT) [facility] Phone: [phone] Fax: [phone]  12/28/2023, 3:17 PM

## 2024-01-02 ENCOUNTER — Ambulatory Visit (INDEPENDENT_AMBULATORY_CARE_PROVIDER_SITE_OTHER)

## 2024-01-02 ENCOUNTER — Ambulatory Visit (INDEPENDENT_AMBULATORY_CARE_PROVIDER_SITE_OTHER): Admitting: Otolaryngology

## 2024-01-15 ENCOUNTER — Ambulatory Visit: Payer: Self-pay | Admitting: Speech Pathology

## 2024-01-18 ENCOUNTER — Telehealth: Payer: Self-pay | Admitting: *Deleted

## 2024-01-18 ENCOUNTER — Telehealth: Payer: Self-pay | Admitting: Pharmacist

## 2024-01-18 DIAGNOSIS — E1159 Type 2 diabetes mellitus with other circulatory complications: Secondary | ICD-10-CM

## 2024-01-18 NOTE — Telephone Encounter (Signed)
 Copied from CRM #[id number]. Topic: Clinical - Medication Question >> Jan 18, 2024  9:57 AM Ahlexyia S wrote: Reason for CRM: Pt called in requesting to speak to [facility] Manuelita Kobs. Pt was prescribed Continuous Glucose Sensor (DEXCOM G7 SENSOR) MISC and pt wants to know if it was a temporary prescription due to him being on his last one and he hasn't received any new  prescriptions for it. Contacted [facility] and was told to send message.

## 2024-01-18 NOTE — Progress Notes (Signed)
 Brief Telephone Documentation Reason for Call: Patient left message regarding question for pharmacist   Summary of Call: Called patient to inform him he does have Dexcom refills, he just needed to call the pharmacy and request a refill.   I requested refill for patient via [facility] - marked for Delivery. Confirmed via Epic, refill is now in progress.  Patient verbalizes understanding, has no further questions.   Follow Up: Patient given direct line for further questions/concerns.  Manuelita FABIENE Kobs, PharmD Clinical Pharmacist [facility] [phone]

## 2024-01-22 ENCOUNTER — Ambulatory Visit: Payer: Self-pay | Attending: Otolaryngology | Admitting: Speech Pathology

## 2024-01-22 ENCOUNTER — Encounter: Payer: Self-pay | Admitting: Speech Pathology

## 2024-01-22 DIAGNOSIS — R1310 Dysphagia, unspecified: Secondary | ICD-10-CM | POA: Diagnosis present

## 2024-01-22 DIAGNOSIS — R498 Other voice and resonance disorders: Secondary | ICD-10-CM | POA: Diagnosis present

## 2024-01-22 NOTE — Therapy (Signed)
 OUTPATIENT SPEECH LANGUAGE PATHOLOGY VOICE TREATMENT & DISCHARGE SUMMARY   Patient Name: Aaron Zimmerman. MRN: [id number] DOB:1950-09-18, 73 y.o., male Today's Date: 01/22/2024  PCP: Jimmy Charlie FERNS, MD REFERRING PROVIDER: Okey Burns, MD   SPEECH THERAPY DISCHARGE SUMMARY  Visits from Start of Care: 5  Current functional level related to goals / functional outcomes: Met all goals; pt is satisfied with swallowing   Remaining deficits: Mild dysphonia; mild dysphagia   Education / Equipment: Completed   Patient agrees to discharge. Patient goals were met. Patient is being discharged due to meeting the stated rehab goals. And being pleased current functional level.    END OF SESSION:  End of Session - 01/22/24 0805     Visit Number 5    SLP Start Time 0800    SLP Stop Time  0840    SLP Time Calculation (min) 40 min    Activity Tolerance Patient tolerated treatment well          Past Medical History:  Diagnosis Date   Cataract right eye    Complication of anesthesia    slow to awaken after 2021 colonscopy   Diabetes mellitus Type 2    Hyperlipidemia    Personal history of COVID-19 07/2020   weakness & fatigue x 1-2 weeks took oral paxlovid all symptoms resolved   Personal history of urinary calculi 2012   Prostate Cancer (HCC)    Seasonal Allergies    Skin abnormality    bottom of left leg scratch healing well pt fell 3 weeks ago per pt on 03-24-2021   Vitamin B12 deficiency    Wears glasses for reading    Past Surgical History:  Procedure Laterality Date   CATARACT EXTRACTION W/ INTRAOCULAR LENS IMPLANT Right 2023   COLONOSCOPY  2021   CYSTOSCOPY N/A 03/30/2021   Procedure: CYSTOSCOPY FLEXIBLE;  Surgeon: Rosalind Zachary NOVAK, MD;  Location: [facility];  Service: Urology;  Laterality: N/A;  No seeds detected in bladder per Dr. Rosalind   left eye cataract surgery     2-3 yrs ago per pt on 03-24-2021   POLYPECTOMY     RADIOACTIVE SEED  IMPLANT N/A 03/30/2021   Procedure: RADIOACTIVE SEED IMPLANT/BRACHYTHERAPY IMPLANT;  Surgeon: Rosalind Zachary NOVAK, MD;  Location: [facility];  Service: Urology;  Laterality: N/A;   SPACE OAR INSTILLATION N/A 03/30/2021   Procedure: SPACE OAR INSTILLATION;  Surgeon: Rosalind Zachary NOVAK, MD;  Location: [facility];  Service: Urology;  Laterality: N/A;   traumatic injury and repair of RT hand  1974   Patient Active Problem List   Diagnosis Date Noted   Change in voice 08/16/2023   Diabetes mellitus treated with insulin  and oral medication (HCC) 02/16/2023   Diabetes mellitus treated with injections of non-insulin  medication (HCC) 02/16/2023   Esophageal dysphagia 02/01/2022   Trigger finger, left little finger 07/27/2021   History of prostate cancer 01/19/2021   Advance directive discussed with patient 12/15/2020   Osteoarthritis of both knees 04/22/2020   Essential hypertension 12/23/2016   Aortic atherosclerosis 11/10/2016   Vitamin B12 deficiency    Preventative health care 03/09/2013   Polyp of colon, adenomatous 03/06/2013   Solitary pulmonary nodule 07/22/2012   ALLERGIC RHINITIS, SEASONAL, MILD 04/28/2009   ONYCHOMYCOSIS, TOENAILS 03/02/2009   Acute cough 08/24/2007   Type 2 diabetes mellitus with other circulatory complications (HCC) 01/22/2007   Hyperlipemia 01/22/2007    Onset date: Referred on 09/28/23  REFERRING DIAG: R49.0 (ICD-10-CM) -  Dysphonia   THERAPY DIAG:  Dysphagia, unspecified type  Other voice and resonance disorders  Rationale for Evaluation and Treatment: Rehabilitation  SUBJECTIVE:   SUBJECTIVE STATEMENT: Pt reports he is doing well.   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 MBS 2018 with mild pharyngeal dysphagia, pyriform residue.   PAIN:  Are you having pain? No  FALLS: Has patient fallen in last 6 months? No, Number of falls:  0  LIVING ENVIRONMENT: Lives with: lives with their spouse; [place]  Lives in: House/apartment  PLOF:Level of assistance: Independent with ADLs, Independent with IADLs Employment: Retired; Works PT (3/week) at [employer]  PATIENT GOALS: swallowing  OBJECTIVE:  Note: Objective measures were completed at Evaluation unless otherwise noted.  DIAGNOSTIC FINDINGS: Per EMR  ENT Chronic dysphonia  Scope exam with evidence of VF atrophy and glottic insufficiency, 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 We discussed benefits of voice therapy and he is willing to try it.  - Refer to speech therapy for voice therapy   Dysphagia and episodes of choking on solid foods  Intermittent choking with occasional swallowing difficulties. Normal OP swallow on MBS in 2018. Hx of GERD previously on Omeprazole .  - Order swallow study to evaluate swallowing mechanism. MBS esophagram ordered.    Altered sense of taste (parageusia) Altered taste possibly linked to smell changes or medication side effects. - Prescribed Flonase  for nasal congestion. - Prescribed Xyzal  5 mg daily for nasal congestion - Advised discussing medication side effects with pharmacist.   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 Water  intake: suboptimal; 2 cups Caffeine/alcohol intake: Pot and a half of coffee and 1 diet coke  Daily voice use:  moderate; mostly at work  PERCEPTUAL VOICE ASSESSMENT: Voice quality: hoarse Vocal abuse: habitual throat clearing and coughing Resonance: normal Respiratory function: thoracic breathing  OBJECTIVE VOICE ASSESSMENT: Maximum phonation time for sustained ah: - Conversational pitch average: - Hz Conversational pitch range: - Hz Conversational loudness average: - dB Conversational loudness range: - dB S/z ratio: - (Suggestive of dysfunction >1.0)  RECOMMENDATIONS FROM OBJECTIVE SWALLOW STUDY (MBSS/FEES):  10/19/23 Clinical Impression: Pt presents with functional oropharyngeal swallow - results were consistent with 2018 findings of pyriform sinus residue post-swallow. There was adequate laryngeal vestibule closure; no aspiration; one episode of high penetration with nectar liquids (PAS 2, WFL). Partial distention of PES and pyriform residuals could explain sensation of food lodging in throat. Liquid wash helped to clear solid residuals. No SLP f/u for swallowing is recommended - continue current diet.   *ESOPHOGRAM (10/19/23)  IMPRESSION: Limited study. Single episode of aspiration seen with small amount of heavy barium, additional barium intake was stopped after this was seen. Esophagram otherwise unremarkable from limited evaluation.  CLINICAL SWALLOW ASSESSMENT:   Current diet: regular and thin liquids Dentition: adequate natural dentition Patient directly observed with POs: No Feeding: able to feed self Liquids provided  by: To observe Oral phase signs and symptoms:  Pharyngeal phase signs and symptoms:  Comments:     PATIENT REPORTED OUTCOME MEASURES (PROM): @ eval: V-RQOL: 27 and EAT-10: 9 @ d/c: V-RQOL: 15 and EAT-10: 5                                                                                                                           TREATMENT DATE:   01/22/24: Pt was seen for skilled ST services targeting dysphagia. He reports he has been trying to reduce known acid  reflux triggers. He has been continuing his swallowing exercises and has seen a reduction in swallowing problems (after the week prior to his ENT appt where he saw an increase). He still does not feel like voice exercises are a priority. We discussed discharging today as pt feels sx have improved with exercises. He is going to try to limit distractions while eating and drinking to see if this makes any difference. Pt feels ready for d/c at this time and voiced he will reach out to the doctor if swallowing sx worsen or if he feels inclined to work on voice exercises. He knows how to initiate referral process. No further concerns at this time. Re-assessed using PROMs - see above.  12/15/23: Pt was seen for skilled ST services targeting dysphagia. Pt reports he has been completing his exercises at home, has started his flonase , and identified that tomatoes ARE a big trigger for acid reflux. He reports after starting flonase , he has not had any hoarseness (though this is not something he wanted to address at eval). He also reports he has stopped drinking tomato juice because he was surprised to find this really triggered his acid reflux. He reports he coughs with liquids maybe once every 3 days. He does report that he reads and watches TV many times when these instances are occurring - SLP recommended eliminating distractions as a strategy. Pt was observed with water  - no overt s/sx of aspiration noted. He was able to demonstrate effortful swallow with sips of water  independently. SLP to recert for 2 additional sessions by 02/08/23 to check in with patient regarding sx. Pt sees ENT at end of Nov. SLP to see for follow up education and determine if there are new recommendations.   11/27/23: Pt was seen for skilled ST services targeting dysphagia. Pt reports he has been completing his exercises, drinking one bottle of water  a day whether I want to or not, and making a conscious effort to chew more. We briefly  discussed safe swalow strategies and s/sx of aspiration. Overall, him and wife report less instances of coughing and feeling strangled since beginning exercises. Pt was observed with regular solids and water . Delayed throat clears noted. He reports feeling some of the cracker. Belch noted. Pt reported liquid wash was successful in removing sensed residue.  SLP educated on acid reflux triggers and modifications. Pt is currently eating several foods that are known to  aggravate reflux. SLP encouraged pt try to limit.  *Pt is still not using flonase .  11/06/23: Observed pt complete [facility] Swallow Protocol - passed with no overt s/sx of aspiration. He reports he has been coughing more without food/drink this week. He reports he has been feeling more phlegm. He has been taking a pill for post nasal drip, but not flonase . SLP reviewed ENT recommendations to pt and printed them for him. Due to presence of aspiration seen on esophogram but not on MBS, SLP provided pt with a couple pharyngeal strengthening exercises re: effortful swallow and CTAR with 5 sec isometric hold. SLP also encouraged pt to try effortful swallow and/or liquid wash when sensing residue. Pt was able to complete exercises independently. Pt is to bring food next session for observation.   PATIENT EDUCATION: Education details: dysphonia, dysphagia, SLP ROLE Person educated: Patient Education method: Explanation Education comprehension: needs further education    GOALS: Goals reviewed with patient? Yes  SHORT TERM GOALS = LONG TERM GOALS DUE TO POC: Target date: 02/08/23  Pt will report use of safe swallow strategies Baseline: Goal status: MET  2.  Pt will improve score on EAT-10 PROM Baseline:  Goal status: MET  3.  Pt will demonstrate effortful swallow Baseline:  Goal status: MET    ASSESSMENT:  CLINICAL IMPRESSION: Pt is a 73 yo male who presented to ST OP for dysphonia. See tx note. To d/c today due to patient feelings of  improvement. To return in a few months if needed.   @ Eval: Pt endorses re: hoarseness started in 2018, but was not consistent. Not feeling as concerned with voice, more concerned with having difficulty swallowing. Pt has not ever completely lost her voice. Pt reports some chronic cough since 2018 - unproductive. He has reportedly has been diagnosed with LPR - silent reflux and post nasal drip. Pt was assessed using voice measures and PROM - see above. Pt and wife are mostly concerned with swallowing. Pt reports he has had altered sense of taste ~ year ago where now he cannot eat anything with egg in it. Pt is getting strangled with liquids and solids. Wife reports he struggles with meats getting stuck - just won't go down.  SLP observed consistent vocal hoarseness and strain when speaking. SLP rec skilled ST services to address unspecified dysphagia. *PT DECLINED WORKING ON DYSPHONIA AT THIS TIME.   OBJECTIVE IMPAIRMENTS: include voice disorder and dysphagia. These impairments are limiting patient from effectively communicating at home and in community and safety when swallowing. Factors affecting potential to achieve goals and functional outcome are NA.SABRA Patient will benefit from skilled SLP services to address above impairments and improve overall function.  REHAB POTENTIAL: Good  PLAN:  SLP FREQUENCY: 2x/month  SLP DURATION: 8 weeks  PLANNED INTERVENTIONS: Aspiration precaution training, Pharyngeal strengthening exercises, Diet toleration management , SLP instruction and feedback, Compensatory strategies, Patient/family education, and [id number] Treatment of swallowing function    [employer], CCC-SLP 01/22/2024, 8:06 AM

## 2024-02-20 ENCOUNTER — Encounter

## 2024-02-27 NOTE — Telephone Encounter (Signed)
 Pt call [employer] gave consent to 2026 re-enrollment is approved thru 02/06/2025.

## 2024-03-21 ENCOUNTER — Ambulatory Visit

## 2024-04-17 ENCOUNTER — Encounter

## 2024-06-27 ENCOUNTER — Ambulatory Visit (INDEPENDENT_AMBULATORY_CARE_PROVIDER_SITE_OTHER)
